# Patient Record
Sex: Female | Born: 1955
Health system: Southern US, Community
[De-identification: ages and names within clinical notes are randomized; demographics above are authoritative.]

## PROBLEM LIST (undated history)

## (undated) DIAGNOSIS — O2230 Deep phlebothrombosis in pregnancy, unspecified trimester: Secondary | ICD-10-CM

## (undated) DIAGNOSIS — Z95810 Presence of automatic (implantable) cardiac defibrillator: Secondary | ICD-10-CM

## (undated) DIAGNOSIS — K589 Irritable bowel syndrome without diarrhea: Secondary | ICD-10-CM

## (undated) DIAGNOSIS — Z8489 Family history of other specified conditions: Secondary | ICD-10-CM

## (undated) DIAGNOSIS — R112 Nausea with vomiting, unspecified: Secondary | ICD-10-CM

## (undated) DIAGNOSIS — I493 Ventricular premature depolarization: Secondary | ICD-10-CM

## (undated) DIAGNOSIS — R011 Cardiac murmur, unspecified: Secondary | ICD-10-CM

## (undated) DIAGNOSIS — I7300 Raynaud's syndrome without gangrene: Secondary | ICD-10-CM

## (undated) DIAGNOSIS — Z9889 Other specified postprocedural states: Secondary | ICD-10-CM

## (undated) DIAGNOSIS — M1990 Unspecified osteoarthritis, unspecified site: Secondary | ICD-10-CM

## (undated) DIAGNOSIS — IMO0001 Reserved for inherently not codable concepts without codable children: Secondary | ICD-10-CM

## (undated) DIAGNOSIS — T7840XA Allergy, unspecified, initial encounter: Secondary | ICD-10-CM

## (undated) DIAGNOSIS — I209 Angina pectoris, unspecified: Secondary | ICD-10-CM

## (undated) DIAGNOSIS — I469 Cardiac arrest, cause unspecified: Secondary | ICD-10-CM

## (undated) DIAGNOSIS — J45909 Unspecified asthma, uncomplicated: Secondary | ICD-10-CM

## (undated) DIAGNOSIS — Z950 Presence of cardiac pacemaker: Secondary | ICD-10-CM

## (undated) DIAGNOSIS — I472 Ventricular tachycardia, unspecified: Secondary | ICD-10-CM

## (undated) DIAGNOSIS — W19XXXA Unspecified fall, initial encounter: Secondary | ICD-10-CM

## (undated) HISTORY — DX: Ventricular tachycardia: I47.2

## (undated) HISTORY — PX: CARPAL TUNNEL RELEASE: SHX101

## (undated) HISTORY — PX: ABDOMINAL HYSTERECTOMY: SHX81

## (undated) HISTORY — DX: Irritable bowel syndrome, unspecified: K58.9

## (undated) HISTORY — DX: Ventricular tachycardia, unspecified: I47.20

## (undated) HISTORY — DX: Cardiac arrest, cause unspecified: I46.9

## (undated) HISTORY — DX: Deep phlebothrombosis in pregnancy, unspecified trimester: O22.30

## (undated) HISTORY — DX: Unspecified fall, initial encounter: W19.XXXA

## (undated) HISTORY — PX: APPENDECTOMY: SHX54

## (undated) HISTORY — PX: OOPHORECTOMY: SHX86

## (undated) HISTORY — DX: Family history of other specified conditions: Z84.89

## (undated) HISTORY — DX: Allergy, unspecified, initial encounter: T78.40XA

## (undated) HISTORY — PX: LAPAROSCOPIC LYSIS OF ADHESIONS: SHX5905

## (undated) HISTORY — DX: Cardiac murmur, unspecified: R01.1

## (undated) HISTORY — PX: PACEMAKER INSERTION: SHX728

## (undated) HISTORY — DX: Presence of automatic (implantable) cardiac defibrillator: Z95.810

## (undated) HISTORY — DX: Ventricular premature depolarization: I49.3

---

## 1984-09-03 DIAGNOSIS — O2230 Deep phlebothrombosis in pregnancy, unspecified trimester: Secondary | ICD-10-CM

## 1984-09-03 HISTORY — DX: Deep phlebothrombosis in pregnancy, unspecified trimester: O22.30

## 1988-03-13 ENCOUNTER — Encounter: Payer: Self-pay | Admitting: Internal Medicine

## 1991-03-12 ENCOUNTER — Encounter: Payer: Self-pay | Admitting: Internal Medicine

## 1992-09-03 DIAGNOSIS — I469 Cardiac arrest, cause unspecified: Secondary | ICD-10-CM

## 1992-09-03 HISTORY — DX: Cardiac arrest, cause unspecified: I46.9

## 1992-09-03 HISTORY — PX: CARDIAC DEFIBRILLATOR PLACEMENT: SHX171

## 1999-09-08 ENCOUNTER — Encounter: Payer: Self-pay | Admitting: Internal Medicine

## 2000-10-16 ENCOUNTER — Encounter: Payer: Self-pay | Admitting: Internal Medicine

## 2001-10-23 ENCOUNTER — Encounter: Payer: Self-pay | Admitting: Internal Medicine

## 2004-06-21 ENCOUNTER — Ambulatory Visit: Payer: Self-pay

## 2004-06-26 ENCOUNTER — Ambulatory Visit: Payer: Self-pay

## 2004-09-03 HISTORY — PX: NASAL SEPTUM SURGERY: SHX37

## 2005-01-01 ENCOUNTER — Encounter: Payer: Self-pay | Admitting: Family Medicine

## 2005-02-01 ENCOUNTER — Encounter: Payer: Self-pay | Admitting: Family Medicine

## 2005-02-01 ENCOUNTER — Ambulatory Visit: Payer: Self-pay | Admitting: Unknown Physician Specialty

## 2005-03-03 ENCOUNTER — Encounter: Payer: Self-pay | Admitting: Family Medicine

## 2005-04-03 ENCOUNTER — Encounter: Payer: Self-pay | Admitting: Family Medicine

## 2005-04-30 ENCOUNTER — Ambulatory Visit: Payer: Self-pay | Admitting: Otolaryngology

## 2005-05-28 ENCOUNTER — Ambulatory Visit: Payer: Self-pay | Admitting: Otolaryngology

## 2005-06-06 ENCOUNTER — Ambulatory Visit: Payer: Self-pay | Admitting: Otolaryngology

## 2006-02-21 ENCOUNTER — Ambulatory Visit: Payer: Self-pay | Admitting: Unknown Physician Specialty

## 2006-05-03 ENCOUNTER — Ambulatory Visit: Payer: Self-pay | Admitting: Unknown Physician Specialty

## 2006-12-12 ENCOUNTER — Ambulatory Visit: Payer: Self-pay | Admitting: Family Medicine

## 2007-02-25 ENCOUNTER — Ambulatory Visit: Payer: Self-pay | Admitting: Unknown Physician Specialty

## 2007-09-30 ENCOUNTER — Ambulatory Visit: Payer: Self-pay | Admitting: Internal Medicine

## 2008-05-20 ENCOUNTER — Ambulatory Visit: Payer: Self-pay | Admitting: Unknown Physician Specialty

## 2008-06-22 ENCOUNTER — Ambulatory Visit: Payer: Self-pay | Admitting: Internal Medicine

## 2009-05-24 ENCOUNTER — Ambulatory Visit: Payer: Self-pay | Admitting: Unknown Physician Specialty

## 2009-11-24 ENCOUNTER — Ambulatory Visit: Payer: Self-pay | Admitting: Internal Medicine

## 2009-12-19 ENCOUNTER — Encounter: Payer: Self-pay | Admitting: Sports Medicine

## 2010-08-09 ENCOUNTER — Ambulatory Visit: Payer: Self-pay | Admitting: Unknown Physician Specialty

## 2010-10-26 ENCOUNTER — Ambulatory Visit: Payer: Self-pay | Admitting: Internal Medicine

## 2011-06-04 ENCOUNTER — Ambulatory Visit (INDEPENDENT_AMBULATORY_CARE_PROVIDER_SITE_OTHER): Payer: PRIVATE HEALTH INSURANCE | Admitting: Internal Medicine

## 2011-06-04 ENCOUNTER — Encounter: Payer: Self-pay | Admitting: Internal Medicine

## 2011-06-04 VITALS — BP 119/77 | HR 66 | Temp 97.5°F | Resp 14 | Ht 67.0 in | Wt 155.0 lb

## 2011-06-04 DIAGNOSIS — J3089 Other allergic rhinitis: Secondary | ICD-10-CM

## 2011-06-04 DIAGNOSIS — I472 Ventricular tachycardia: Secondary | ICD-10-CM

## 2011-06-04 DIAGNOSIS — J302 Other seasonal allergic rhinitis: Secondary | ICD-10-CM

## 2011-06-04 DIAGNOSIS — K581 Irritable bowel syndrome with constipation: Secondary | ICD-10-CM | POA: Insufficient documentation

## 2011-06-04 DIAGNOSIS — I4729 Other ventricular tachycardia: Secondary | ICD-10-CM

## 2011-06-04 DIAGNOSIS — J309 Allergic rhinitis, unspecified: Secondary | ICD-10-CM

## 2011-06-04 DIAGNOSIS — M5126 Other intervertebral disc displacement, lumbar region: Secondary | ICD-10-CM | POA: Insufficient documentation

## 2011-06-04 MED ORDER — PREDNISONE (PAK) 10 MG PO TABS: ORAL_TABLET | ORAL | Status: AC

## 2011-06-04 MED ORDER — ZOSTER VACCINE LIVE 19400 UNT/0.65ML ~~LOC~~ SOLR: 0.6500 mL | Freq: Once | SUBCUTANEOUS | Status: DC

## 2011-06-04 MED ORDER — PREDNISONE 5 MG PO TABS: 5.0000 mg | ORAL_TABLET | ORAL | Status: AC

## 2011-06-04 NOTE — Assessment & Plan Note (Signed)
She is requesting referral to [facility] from [facility] due to change in [facility] coverage.  Her AICD  battery needs changing in 2013.  Will refer to Berton Mount or Sharrell Ku.

## 2011-06-04 NOTE — Progress Notes (Signed)
  Subjective:    Patient ID: Nicole Swanson, female    DOB: 10-17-1955, 55 y.o.   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  She is requesting referral to [facility] due to changes in insurance coverage no longer covering [facility]. She is also requesting additional management of her seasonal rhinitis which is not controlled currently since mid September. Finally shed is requesting consideration of alternative treatment modalities for her back pain with radiculopathy which is worse with sitting and lying dow . Her back is so uncomfortable that she is now standing at work.  Past Medical History  Diagnosis Date  . Idiopathic ventricular tachycardia 1994  . DVT (deep vein thrombosis) in pregnancy 1986    had heparin shots followed by coumadin  . Rhinitis, allergic to other allergen   . 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 Irritable bowel syndrome    No current outpatient prescriptions on file prior to visit.      Review of Systems  Constitutional: Negative for fever, chills and unexpected weight change.  HENT: Negative for hearing loss, ear pain, nosebleeds, congestion, sore throat, facial swelling, rhinorrhea, sneezing, mouth sores, trouble swallowing, neck pain, neck stiffness, voice change, postnasal drip, sinus pressure, tinnitus and ear discharge.   Eyes: Negative for pain, discharge, redness and visual disturbance.  Respiratory: Negative for cough, chest tightness, shortness of breath, wheezing and stridor.   Cardiovascular: Negative for chest pain, palpitations and leg swelling.  Musculoskeletal: Positive for back pain. Negative for myalgias and arthralgias.  Skin:  Negative for color change and rash.  Neurological: Negative for dizziness, weakness, light-headedness and headaches.  Hematological: Negative for adenopathy.  Psychiatric/Behavioral: Positive for sleep disturbance.   BP 119/77  Pulse 66  Temp(Src) 97.5 F (36.4 C) (Oral)  Resp 14  Ht 5\' 7"  (1.702 m)  Wt 155 lb (70.308 kg)  BMI 24.28 kg/m2  SpO2 99%     Objective:   Physical Exam  Constitutional: She is oriented to person, place, and time.  Neurological: She is alert and oriented to person, place, and time. She has normal strength. She displays abnormal reflex. No cranial nerve deficit. Coordination and gait normal.  Reflex Scores:      Tricep reflexes are 2+ on the right side and 2+ on the left side.      Bicep reflexes are 2+ on the right side and 2+ on the left side.      Brachioradialis reflexes are 2+ on the right side and 2+ on the left side.      Patellar reflexes are 2+ on the right side and 4+ on the left side.         Assessment & Plan:

## 2011-06-04 NOTE — Assessment & Plan Note (Addendum)
Her back pain is constant and radiates down th enetire length of her left leg.  MRI has not been doen due to AICD/pacer.  Will send for CT of lumbar spine to rule out herniated disk with sciatic nerve root compromise. Trial of neurontin and prednisone for pain control.

## 2011-06-04 NOTE — Assessment & Plan Note (Signed)
She has persistent symptoms, including congestion, sneezing and conjunctivitis despite simultaneous use of allegra, singulair and flonase.  She has had allergy shots for densensitization which has also failed.  Will try a short course of prednisone (taper x 6 days, then 5 mg every other days for 2 weeks).

## 2011-06-12 ENCOUNTER — Encounter: Payer: Self-pay | Admitting: Internal Medicine

## 2011-06-18 ENCOUNTER — Telehealth: Payer: Self-pay | Admitting: *Deleted

## 2011-06-18 NOTE — Telephone Encounter (Signed)
Message copied by Jobie Quaker on Mon Jun 18, 2011  2:00 PM ------      Message from: Duncan Dull      Created: Thu Jun 14, 2011  5:19 PM      Regarding: labs       I received her labs.  I would not do a thing about her LDL of 122, because  her HDL is 95 (really really good). Repeat in one year.

## 2011-06-18 NOTE — Telephone Encounter (Signed)
Patient notified

## 2011-06-22 ENCOUNTER — Ambulatory Visit: Payer: Self-pay | Admitting: Internal Medicine

## 2011-06-25 ENCOUNTER — Telehealth: Payer: Self-pay | Admitting: Internal Medicine

## 2011-06-25 DIAGNOSIS — M5440 Lumbago with sciatica, unspecified side: Secondary | ICD-10-CM

## 2011-06-25 NOTE — Telephone Encounter (Signed)
Message copied by Edd Fabian on Mon Jun 25, 2011  4:04 PM ------      Message from: Duncan Dull      Created: Mon Jun 25, 2011  1:57 PM      Regarding: ct scan of lumbar spine       Her CT of the spine showed multilevel mild changes but no disk protrusion or spinal stenosis so there is nothing surgical going on.  Does she want to try PT or referral to Pain clinic?

## 2011-06-25 NOTE — Telephone Encounter (Signed)
PT referral in EPIc

## 2011-06-25 NOTE — Telephone Encounter (Signed)
Notified patient of message, she stated she would like to try PT at [facility].  Please advise.

## 2011-06-28 ENCOUNTER — Ambulatory Visit (INDEPENDENT_AMBULATORY_CARE_PROVIDER_SITE_OTHER): Payer: PRIVATE HEALTH INSURANCE | Admitting: Internal Medicine

## 2011-06-28 ENCOUNTER — Encounter: Payer: Self-pay | Admitting: Internal Medicine

## 2011-06-28 DIAGNOSIS — I472 Ventricular tachycardia, unspecified: Secondary | ICD-10-CM

## 2011-06-28 DIAGNOSIS — Z95810 Presence of automatic (implantable) cardiac defibrillator: Secondary | ICD-10-CM

## 2011-06-28 DIAGNOSIS — I469 Cardiac arrest, cause unspecified: Secondary | ICD-10-CM | POA: Insufficient documentation

## 2011-06-28 DIAGNOSIS — I4949 Other premature depolarization: Secondary | ICD-10-CM

## 2011-06-28 DIAGNOSIS — I4729 Other ventricular tachycardia: Secondary | ICD-10-CM

## 2011-06-28 DIAGNOSIS — I493 Ventricular premature depolarization: Secondary | ICD-10-CM

## 2011-06-28 LAB — ICD DEVICE OBSERVATION
AL IMPEDENCE ICD: 512 Ohm
ATRIAL PACING ICD: 65.61 pct
BAMS-0001: 170 {beats}/min
DEV-0020ICD: NEGATIVE
FVT: 0
RV LEAD IMPEDENCE ICD: 456 Ohm
RV LEAD THRESHOLD: 0.5 V
TOT-0001: 2
TOT-0006: 20070119000000
TZAT-0001FASTVT: 1
TZAT-0002ATACH: NEGATIVE
TZAT-0002ATACH: NEGATIVE
TZAT-0002ATACH: NEGATIVE
TZAT-0018ATACH: NEGATIVE
TZAT-0019ATACH: 6 V
TZAT-0019ATACH: 6 V
TZAT-0019ATACH: 6 V
TZAT-0019SLOWVT: 8 V
TZAT-0020FASTVT: 1.5 ms
TZAT-0020SLOWVT: 1.5 ms
TZON-0003SLOWVT: 400 ms
TZON-0003VSLOWVT: 400 ms
TZON-0004SLOWVT: 16
TZON-0004VSLOWVT: 20
TZON-0005SLOWVT: 12
TZST-0001ATACH: 5
TZST-0001FASTVT: 2
TZST-0001FASTVT: 3
TZST-0001FASTVT: 4
TZST-0001SLOWVT: 2
TZST-0001SLOWVT: 3
TZST-0001SLOWVT: 5
TZST-0002ATACH: NEGATIVE
TZST-0002ATACH: NEGATIVE
TZST-0002FASTVT: NEGATIVE
TZST-0002SLOWVT: NEGATIVE
TZST-0002SLOWVT: NEGATIVE
VENTRICULAR PACING ICD: 0.16 pct
VF: 0

## 2011-06-28 NOTE — Patient Instructions (Signed)
Your physician recommends that you schedule a follow-up appointment in: 1 year  

## 2011-06-28 NOTE — Progress Notes (Signed)
HPI  Nicole Swanson is a 55 y.o. 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  In 1997, with a prior history of having presyncope associated with ventricular tachycardia and prior therapy with beta blockers having been discontinued, the patient had a cardiac arrest. She was transferred to [facility]. The evaluation was non-clarifying. She received an ICD. She had a number of appropriate ICD discharges thereafter prompting initiation of sotalol since which time she has had no recurrent ICD discharges. She continues to have multiple episodes of ventricular ectopy. She was also noted to have bradycardia associated ventricular ectopy and ventricular tachycardia and underwent dual-chamber device upgrade  She is approaching ERI of recurrent device. She is hoping to establish her care more locally and hence she is here.  Her family history is noteworthy. She has a brother with a "seizure disorder" with no abnormal EEGs. In addition, her daughter has a seizure disorder with an abnormal EEG. The question of a genetic disorder was reviewed 15 years ago but further genetic testing has not been performed.  The patient has minimal limitations exercise tolerance. Past Medical History  Diagnosis Date  . Idiopathic ventricular tachycardia 1994  . DVT (deep vein thrombosis) in pregnancy 1986    had heparin shots followed by coumadin  . Rhinitis, allergic to other allergen   . 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 Irritable bowel syndrome     Past Surgical History  Procedure Date  . Cardiac defibrillator placement 1994    Dual chamber with pacemaker    Current Outpatient Prescriptions  Medication Sig Dispense Refill  . albuterol (PROVENTIL HFA;VENTOLIN HFA) 108 (90 BASE) MCG/ACT inhaler Inhale 2 puffs into the lungs every 6 (six) hours as needed.        Marland Kitchen aspirin 81 MG tablet Take 81 mg by mouth daily.        . clidinium-chlordiazePOXIDE  (LIBRAX) 2.5-5 MG per capsule Take 1 capsule by mouth 4 (four) times daily as needed.        Marland Kitchen dexlansoprazole (DEXILANT) 60 MG capsule Take 60 mg by mouth daily.        Marland Kitchen estrogen-methylTESTOSTERone (ESTRATEST) 1.25-2.5 MG per tablet Take 1 tablet by mouth. Monday, Wednesday, and Friday       . fexofenadine (ALLEGRA) 180 MG tablet Take 180 mg by mouth daily.        . fluticasone (FLONASE) 50 MCG/ACT nasal spray Place 1 spray into the nose daily.        . montelukast (SINGULAIR) 10 MG tablet Take 10 mg by mouth at bedtime.        . sotalol (BETAPACE) 120 MG tablet Take 120 mg by mouth 2 (two) times daily.        Marland Kitchen zoster vaccine live, PF, (ZOSTAVAX) 16109 UNT/0.65ML injection Inject 19,400 Units into the skin once.  1 vial  0    Allergies  Allergen Reactions  . Penicillins Rash  . Vancomycin Rash    Review of Systems negative except from HPI and PMH  Physical Exam Well developed and well nourished middle-age Caucasian female appearing her stated agein no acute distress HENT normal E scleral and icterus clear Neck Supple JVP flat; carotids brisk and full Clear to ausculation Regular rate and rhythm, no murmurs gallops or rub Soft with active bowel sounds No clubbing cyanosis and edema Alert and oriented, grossly normal motor and sensory function Skin Warm and Dry  ECG sinus  rhythm at 61 Intervals 0.09/2006/0.45 There are T-wave inversions  III V3 and V4  Assessment and  Plan

## 2011-06-28 NOTE — Assessment & Plan Note (Signed)
The patient's device was interrogated.  The information was reviewed. No changes were made in the programming.    

## 2011-06-28 NOTE — Assessment & Plan Note (Signed)
The patient has a properly treated with an ICD and with sotalol. I forgot to arrange for blood testing on her today to look at her potassium and magnesium important in the setting of ongoing sotalol therapy. We will arrange this.  Her ICD is functioning normally.  The unclear issue as to why she had the event in the first place and with her family history of seizures in her brother and her daughter is further genetic testing indicated to try and identify a unifying problem. I will review this with Dr. Robin Searing at [facility] bow anticipate at least be aborted cardiac arrest screening available.  Her T-wave inversions suggest the possibility of arrhythmogenic RV myopathy; a signal-averaged ECG might be indicated. We will have to find out what's been done

## 2011-06-28 NOTE — Assessment & Plan Note (Signed)
I don't know the morphology of these beats. I wonder whether these might have been the trigger for her cardiac arrest. They comprise about 4% of her beats

## 2011-07-02 ENCOUNTER — Telehealth: Payer: Self-pay | Admitting: *Deleted

## 2011-07-02 DIAGNOSIS — Z79899 Other long term (current) drug therapy: Secondary | ICD-10-CM

## 2011-07-02 DIAGNOSIS — I493 Ventricular premature depolarization: Secondary | ICD-10-CM

## 2011-07-02 NOTE — Telephone Encounter (Signed)
Pt called back and left msg on my vm, asking to call back on her cell [phone].

## 2011-07-02 NOTE — Telephone Encounter (Signed)
Called and left msg with pt's husband for her to call back. Need to schedule pt for BMP and Mg per SK; pt on sotalol tx.

## 2011-07-03 NOTE — Telephone Encounter (Signed)
Pt will come in tomorrow for labs.

## 2011-07-04 ENCOUNTER — Ambulatory Visit (INDEPENDENT_AMBULATORY_CARE_PROVIDER_SITE_OTHER): Payer: PRIVATE HEALTH INSURANCE | Admitting: *Deleted

## 2011-07-04 DIAGNOSIS — I493 Ventricular premature depolarization: Secondary | ICD-10-CM

## 2011-07-04 DIAGNOSIS — Z79899 Other long term (current) drug therapy: Secondary | ICD-10-CM

## 2011-07-04 DIAGNOSIS — I4949 Other premature depolarization: Secondary | ICD-10-CM

## 2011-07-05 DIAGNOSIS — 419620001 Death: Secondary | SNOMED CT

## 2011-07-05 LAB — MAGNESIUM: Magnesium: 2.2 mg/dL (ref 1.6–2.6)

## 2011-07-05 LAB — BASIC METABOLIC PANEL
Creatinine, Ser: 0.87 mg/dL (ref 0.57–1.00)
GFR calc Af Amer: 86 mL/min/{1.73_m2} (ref >59)
GFR calc non Af Amer: 74 mL/min/{1.73_m2} (ref >59)
Glucose: 105 mg/dL — ABNORMAL HIGH (ref 65–99)
Potassium: 4.1 mmol/L (ref 3.5–5.2)

## 2011-07-05 DEATH — deceased

## 2011-07-11 ENCOUNTER — Encounter: Payer: Self-pay | Admitting: Internal Medicine

## 2011-07-12 ENCOUNTER — Encounter: Payer: Self-pay | Admitting: Internal Medicine

## 2011-07-16 ENCOUNTER — Encounter: Payer: Self-pay | Admitting: Internal Medicine

## 2011-08-04 ENCOUNTER — Encounter: Payer: Self-pay | Admitting: Internal Medicine

## 2011-09-10 ENCOUNTER — Encounter: Payer: Self-pay | Admitting: Internal Medicine

## 2011-09-10 ENCOUNTER — Ambulatory Visit: Payer: Self-pay | Admitting: Obstetrics and Gynecology

## 2011-09-27 ENCOUNTER — Encounter: Payer: PRIVATE HEALTH INSURANCE | Admitting: *Deleted

## 2011-09-28 ENCOUNTER — Telehealth: Payer: Self-pay | Admitting: *Deleted

## 2011-09-28 NOTE — Telephone Encounter (Signed)
PA is required on Dexilant. Form has been requested.

## 2011-10-01 ENCOUNTER — Encounter: Payer: Self-pay | Admitting: *Deleted

## 2011-10-01 LAB — HM MAMMOGRAPHY: HM Mammogram: NORMAL

## 2011-10-01 NOTE — Telephone Encounter (Signed)
Prior auth form placed in Dr. Melina Schools office.

## 2011-10-03 ENCOUNTER — Ambulatory Visit (INDEPENDENT_AMBULATORY_CARE_PROVIDER_SITE_OTHER): Payer: PRIVATE HEALTH INSURANCE | Admitting: *Deleted

## 2011-10-03 ENCOUNTER — Encounter: Payer: Self-pay | Admitting: Internal Medicine

## 2011-10-03 DIAGNOSIS — Z95810 Presence of automatic (implantable) cardiac defibrillator: Secondary | ICD-10-CM

## 2011-10-03 DIAGNOSIS — I469 Cardiac arrest, cause unspecified: Secondary | ICD-10-CM

## 2011-10-03 DIAGNOSIS — I509 Heart failure, unspecified: Secondary | ICD-10-CM

## 2011-10-05 DIAGNOSIS — 419620001 Death: Secondary | SNOMED CT

## 2011-10-05 DEATH — deceased

## 2011-10-06 LAB — REMOTE ICD DEVICE
BAMS-0001: 170 {beats}/min
BATTERY VOLTAGE: 2.64 V
CHARGE TIME: 12.962 s
DEV-0020ICD: NEGATIVE
PACEART VT: 0
RV LEAD AMPLITUDE: 9.5 mv
TOT-0002: 0
TZAT-0001ATACH: 1
TZAT-0001ATACH: 2
TZAT-0001ATACH: 3
TZAT-0002ATACH: NEGATIVE
TZAT-0012ATACH: 150 ms
TZAT-0012ATACH: 150 ms
TZAT-0012ATACH: 150 ms
TZAT-0012FASTVT: 200 ms
TZAT-0012SLOWVT: 200 ms
TZAT-0018ATACH: NEGATIVE
TZAT-0018ATACH: NEGATIVE
TZAT-0018FASTVT: NEGATIVE
TZAT-0018SLOWVT: NEGATIVE
TZAT-0019ATACH: 6 V
TZAT-0020ATACH: 1.5 ms
TZAT-0020FASTVT: 1.5 ms
TZON-0003ATACH: 350 ms
TZON-0003SLOWVT: 400 ms
TZON-0003VSLOWVT: 400 ms
TZON-0004VSLOWVT: 20
TZST-0001ATACH: 4
TZST-0001ATACH: 6
TZST-0001FASTVT: 2
TZST-0001FASTVT: 6
TZST-0001SLOWVT: 2
TZST-0001SLOWVT: 3
TZST-0001SLOWVT: 4
TZST-0002ATACH: NEGATIVE
TZST-0002ATACH: NEGATIVE
TZST-0002FASTVT: NEGATIVE
TZST-0002FASTVT: NEGATIVE
TZST-0002SLOWVT: NEGATIVE
TZST-0002SLOWVT: NEGATIVE
TZST-0002SLOWVT: NEGATIVE
VF: 0

## 2011-10-10 NOTE — Progress Notes (Signed)
Remote defib check w/icm  

## 2011-10-23 ENCOUNTER — Encounter: Payer: Self-pay | Admitting: *Deleted

## 2011-11-08 ENCOUNTER — Ambulatory Visit (INDEPENDENT_AMBULATORY_CARE_PROVIDER_SITE_OTHER): Payer: PRIVATE HEALTH INSURANCE | Admitting: *Deleted

## 2011-11-08 ENCOUNTER — Encounter: Payer: Self-pay | Admitting: Internal Medicine

## 2011-11-08 DIAGNOSIS — I509 Heart failure, unspecified: Secondary | ICD-10-CM

## 2011-11-08 DIAGNOSIS — I469 Cardiac arrest, cause unspecified: Secondary | ICD-10-CM

## 2011-11-08 DIAGNOSIS — Z95810 Presence of automatic (implantable) cardiac defibrillator: Secondary | ICD-10-CM

## 2011-11-09 LAB — REMOTE ICD DEVICE
AL AMPLITUDE: 3.1 mv
BAMS-0001: 170 {beats}/min
DEV-0020ICD: NEGATIVE
FVT: 0
PACEART VT: 0
RV LEAD AMPLITUDE: 9.5 mv
RV LEAD IMPEDENCE ICD: 472 Ohm
TZAT-0001ATACH: 1
TZAT-0001ATACH: 2
TZAT-0001ATACH: 3
TZAT-0002ATACH: NEGATIVE
TZAT-0002ATACH: NEGATIVE
TZAT-0002SLOWVT: NEGATIVE
TZAT-0012ATACH: 150 ms
TZAT-0018ATACH: NEGATIVE
TZAT-0018ATACH: NEGATIVE
TZAT-0018SLOWVT: NEGATIVE
TZAT-0019ATACH: 6 V
TZAT-0019FASTVT: 8 V
TZAT-0019SLOWVT: 8 V
TZAT-0020FASTVT: 1.5 ms
TZAT-0020SLOWVT: 1.5 ms
TZON-0003SLOWVT: 400 ms
TZON-0004SLOWVT: 16
TZON-0004VSLOWVT: 20
TZON-0005SLOWVT: 12
TZST-0001ATACH: 5
TZST-0001ATACH: 6
TZST-0001FASTVT: 2
TZST-0001FASTVT: 3
TZST-0001FASTVT: 5
TZST-0001SLOWVT: 6
TZST-0002ATACH: NEGATIVE
TZST-0002ATACH: NEGATIVE
TZST-0002FASTVT: NEGATIVE
TZST-0002FASTVT: NEGATIVE
TZST-0002SLOWVT: NEGATIVE
TZST-0002SLOWVT: NEGATIVE
TZST-0002SLOWVT: NEGATIVE
VF: 0

## 2011-11-14 NOTE — Progress Notes (Signed)
Remote icd check w/icm  

## 2011-11-20 ENCOUNTER — Encounter: Payer: Self-pay | Admitting: *Deleted

## 2011-12-03 DIAGNOSIS — 419620001 Death: Secondary | SNOMED CT

## 2011-12-03 DEATH — deceased

## 2011-12-13 ENCOUNTER — Ambulatory Visit (INDEPENDENT_AMBULATORY_CARE_PROVIDER_SITE_OTHER): Payer: PRIVATE HEALTH INSURANCE | Admitting: *Deleted

## 2011-12-13 ENCOUNTER — Encounter: Payer: Self-pay | Admitting: Internal Medicine

## 2011-12-13 DIAGNOSIS — Z95810 Presence of automatic (implantable) cardiac defibrillator: Secondary | ICD-10-CM

## 2011-12-13 DIAGNOSIS — I469 Cardiac arrest, cause unspecified: Secondary | ICD-10-CM

## 2011-12-13 DIAGNOSIS — I509 Heart failure, unspecified: Secondary | ICD-10-CM

## 2011-12-14 LAB — REMOTE ICD DEVICE
AL AMPLITUDE: 2.8 mv
CHARGE TIME: 12.962 s
PACEART VT: 0
RV LEAD AMPLITUDE: 9.1 mv
RV LEAD IMPEDENCE ICD: 456 Ohm
TOT-0001: 2
TOT-0002: 0
TZAT-0001ATACH: 1
TZAT-0001ATACH: 2
TZAT-0001SLOWVT: 1
TZAT-0002ATACH: NEGATIVE
TZAT-0002ATACH: NEGATIVE
TZAT-0002FASTVT: NEGATIVE
TZAT-0002SLOWVT: NEGATIVE
TZAT-0012FASTVT: 200 ms
TZAT-0018ATACH: NEGATIVE
TZAT-0018ATACH: NEGATIVE
TZAT-0019ATACH: 6 V
TZAT-0019ATACH: 6 V
TZAT-0019SLOWVT: 8 V
TZAT-0020ATACH: 1.5 ms
TZAT-0020FASTVT: 1.5 ms
TZON-0004VSLOWVT: 20
TZST-0001ATACH: 5
TZST-0001FASTVT: 5
TZST-0001SLOWVT: 3
TZST-0001SLOWVT: 4
TZST-0001SLOWVT: 5
TZST-0002ATACH: NEGATIVE
TZST-0002ATACH: NEGATIVE
TZST-0002FASTVT: NEGATIVE
TZST-0002FASTVT: NEGATIVE
TZST-0002FASTVT: NEGATIVE
TZST-0002SLOWVT: NEGATIVE
TZST-0002SLOWVT: NEGATIVE

## 2011-12-20 NOTE — Progress Notes (Signed)
Remote icd check w/icm  

## 2012-01-01 ENCOUNTER — Ambulatory Visit (INDEPENDENT_AMBULATORY_CARE_PROVIDER_SITE_OTHER): Payer: PRIVATE HEALTH INSURANCE | Admitting: Internal Medicine

## 2012-01-01 ENCOUNTER — Encounter: Payer: Self-pay | Admitting: *Deleted

## 2012-01-01 ENCOUNTER — Encounter: Payer: Self-pay | Admitting: Internal Medicine

## 2012-01-01 DIAGNOSIS — M5126 Other intervertebral disc displacement, lumbar region: Secondary | ICD-10-CM

## 2012-01-01 MED ORDER — DEXLANSOPRAZOLE 60 MG PO CPDR: 60.0000 mg | DELAYED_RELEASE_CAPSULE | Freq: Every day | ORAL | Status: DC

## 2012-01-01 MED ORDER — ALBUTEROL SULFATE HFA 108 (90 BASE) MCG/ACT IN AERS: 2.0000 | INHALATION_SPRAY | Freq: Four times a day (QID) | RESPIRATORY_TRACT | Status: DC | PRN

## 2012-01-01 MED ORDER — CELECOXIB 200 MG PO CAPS: 200.0000 mg | ORAL_CAPSULE | Freq: Two times a day (BID) | ORAL | Status: AC

## 2012-01-01 MED ORDER — FLUTICASONE PROPIONATE 50 MCG/ACT NA SUSP: 1.0000 | Freq: Every day | NASAL | Status: DC

## 2012-01-01 MED ORDER — FEXOFENADINE HCL 180 MG PO TABS: 180.0000 mg | ORAL_TABLET | Freq: Every day | ORAL | Status: DC

## 2012-01-01 MED ORDER — MONTELUKAST SODIUM 10 MG PO TABS: 10.0000 mg | ORAL_TABLET | Freq: Every day | ORAL | Status: DC

## 2012-01-01 NOTE — Assessment & Plan Note (Signed)
With no evidence of spinal stenosis by October 2012 CT,  And no radiculopathy.  Trial of celebrex 200 mg one or two times daily.  With tylenol 2000 mg daily.  Discussed trial of TENS Unit but will need to check with EP Dr. Graciela Husbands .

## 2012-01-01 NOTE — Patient Instructions (Signed)
We are trying celebrex as an alternative to nabumetone .  Start with  one capsule daily,  Increase to every 12 hours if needed.,  Also ok to add tylenol 500 mg 4 daily (total of 2000 mg daily) to either celebrex or nabumetone  Option 3 is increase nabumetone  750 mg twice daily and add tylenol to it

## 2012-01-01 NOTE — Progress Notes (Signed)
Patient ID: Nicole Swanson, female   DOB: Dec 18, 1955, 56 y.o.   MRN: [id number]  Patient Active Problem List  Diagnoses  . 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 Irritable bowel syndrome  . Aborted cardiac arrest  . Dual implantable cardiac defibrillator  . PVC's (premature ventricular contractions)    Subjective:  CC:   Chief Complaint  Patient presents with  . Medication Refill  . Back Pain    HPI:   Nicole Chittum Priestis a 56 y.o. female who presents with persistent low back pain,  Somewhat improved after PT referral in October following CT of lumbar spine which was negative for spinal stenosis or herniated disk.  but she has returned to her previous level of pain.  She is using nabumetone 500 mg twice daily with incomplete relief, and a prior tramadol trial caused dysphoria.  She has no prior trial of celebrex and tylenol so she is willing to try that now.  She denies numbness and radiation and has no history of GI upset with use of nabumetone.  Takes Nexium daily .    2nd issue is impending demise of her AICD. Receiving close follow up with Dr. Graciela Husbands for any misfirings and need for battery replacement.     Past Medical History  Diagnosis Date  . Aborted cardiac arrest 1994  . DVT (deep vein thrombosis) in pregnancy 1986    had heparin shots followed by coumadin  . Dual implantable cardiac defibrillator     Medtronic initially implanted in 1997  . 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 Irritable bowel syndrome   . PVC's (premature ventricular contractions)   . Ventricular tachycardia   . Family history of seizures     Question neurological versus cardiac    Past Surgical History  Procedure Date  . Cardiac defibrillator placement 1994    Dual chamber with pacemaker  . Nasal septum surgery 2006    [name]          The following portions of the patient's history were reviewed and updated as appropriate: Allergies, current  medications, and problem list.    Review of Systems:   12 Pt  review of systems was negative except those addressed in the HPI,     History   Social History  . Marital Status: Married    Spouse Name: N/A    Number of Children: N/A  . Years of Education: N/A   Occupational History  . Not on file.   Social History Main Topics  . Smoking status: Former Smoker    Types: Cigarettes    Quit date: 06/04/1995  . Smokeless tobacco: Never Used  . Alcohol Use: Yes     occasional  . Drug Use: No  . Sexually Active: Not on file   Other Topics Concern  . Not on file   Social History Narrative  . No narrative on file    Objective:  BP 108/60  Pulse 76  Temp(Src) 98.4 F (36.9 C) (Oral)  Resp 14  Ht 5\' 7"  (1.702 m)  Wt 155 lb (70.308 kg)  BMI 24.28 kg/m2  SpO2 97%  General appearance: alert, cooperative and appears stated age Ears: normal TM's and external ear canals both ears Throat: lips, mucosa, and tongue normal; teeth and gums normal Neck: no adenopathy, no carotid bruit, supple, symmetrical, trachea midline and thyroid not enlarged, symmetric, no tenderness/mass/nodules Back: symmetric, no curvature. ROM normal. No CVA tenderness. Lungs: clear to auscultation  bilaterally Heart: regular rate and rhythm, S1, S2 normal, no murmur, click, rub or gallop Abdomen: soft, non-tender; bowel sounds normal; no masses,  no organomegaly Pulses: 2+ and symmetric Skin: Skin color, texture, turgor normal. No rashes or lesions Lymph nodes: Cervical, supraclavicular, and axillary nodes normal.  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  Trial of celebrex 200 mg one or two times daily.  With tylenol 2000 mg daily.  Discussed trial of TENS Unit but will need to check with EP Dr. Graciela Husbands .      Updated Medication List Outpatient Encounter Prescriptions as of 01/01/2012   Medication Sig Dispense Refill  . albuterol (PROVENTIL HFA;VENTOLIN HFA) 108 (90 BASE) MCG/ACT inhaler Inhale 2 puffs into the lungs every 6 (six) hours as needed.  18 g  5  . aspirin 81 MG tablet Take 81 mg by mouth daily.        . clidinium-chlordiazePOXIDE (LIBRAX) 2.5-5 MG per capsule Take 1 capsule by mouth 4 (four) times daily as needed.        Marland Kitchen dexlansoprazole (DEXILANT) 60 MG capsule Take 1 capsule (60 mg total) by mouth daily.  30 capsule  5  . estrogen-methylTESTOSTERone (ESTRATEST) 1.25-2.5 MG per tablet Take 1 tablet by mouth. Monday, Wednesday, and Friday       . fexofenadine (ALLEGRA) 180 MG tablet Take 1 tablet (180 mg total) by mouth daily.  30 tablet  5  . fluticasone (FLONASE) 50 MCG/ACT nasal spray Place 1 spray into the nose daily.  16 g  5  . montelukast (SINGULAIR) 10 MG tablet Take 1 tablet (10 mg total) by mouth at bedtime.  30 tablet  5  . sotalol (BETAPACE) 120 MG tablet Take 120 mg by mouth 2 (two) times daily.        Marland Kitchen DISCONTD: albuterol (PROVENTIL HFA;VENTOLIN HFA) 108 (90 BASE) MCG/ACT inhaler Inhale 2 puffs into the lungs every 6 (six) hours as needed.        Marland Kitchen DISCONTD: dexlansoprazole (DEXILANT) 60 MG capsule Take 60 mg by mouth daily.        Marland Kitchen DISCONTD: fexofenadine (ALLEGRA) 180 MG tablet Take 180 mg by mouth daily.        Marland Kitchen DISCONTD: fluticasone (FLONASE) 50 MCG/ACT nasal spray Place 1 spray into the nose daily.        Marland Kitchen DISCONTD: montelukast (SINGULAIR) 10 MG tablet Take 10 mg by mouth at bedtime.        . celecoxib (CELEBREX) 200 MG capsule Take 1 capsule (200 mg total) by mouth 2 (two) times daily.  60 capsule  3  . DISCONTD: zoster vaccine live, PF, (ZOSTAVAX) 78295 UNT/0.65ML injection Inject 19,400 Units into the skin once.  1 vial  0     Orders Placed This Encounter  Procedures  . HM MAMMOGRAPHY  . HM COLONOSCOPY    No Follow-up on file.

## 2012-01-10 ENCOUNTER — Encounter: Payer: Self-pay | Admitting: Internal Medicine

## 2012-01-10 ENCOUNTER — Ambulatory Visit (INDEPENDENT_AMBULATORY_CARE_PROVIDER_SITE_OTHER): Payer: PRIVATE HEALTH INSURANCE | Admitting: *Deleted

## 2012-01-10 DIAGNOSIS — I469 Cardiac arrest, cause unspecified: Secondary | ICD-10-CM

## 2012-01-10 DIAGNOSIS — I509 Heart failure, unspecified: Secondary | ICD-10-CM

## 2012-01-10 DIAGNOSIS — Z95810 Presence of automatic (implantable) cardiac defibrillator: Secondary | ICD-10-CM

## 2012-01-16 LAB — REMOTE ICD DEVICE
AL AMPLITUDE: 2.9 mv
AL IMPEDENCE ICD: 480 Ohm
ATRIAL PACING ICD: 47.95 pct
BATTERY VOLTAGE: 2.62 V
FVT: 0
RV LEAD AMPLITUDE: 8.5 mv
RV LEAD IMPEDENCE ICD: 448 Ohm
TOT-0006: 20070119000000
TZAT-0001ATACH: 1
TZAT-0001ATACH: 2
TZAT-0001ATACH: 3
TZAT-0001SLOWVT: 1
TZAT-0002ATACH: NEGATIVE
TZAT-0002FASTVT: NEGATIVE
TZAT-0002SLOWVT: NEGATIVE
TZAT-0012ATACH: 150 ms
TZAT-0012ATACH: 150 ms
TZAT-0012SLOWVT: 200 ms
TZAT-0018ATACH: NEGATIVE
TZAT-0018ATACH: NEGATIVE
TZAT-0018FASTVT: NEGATIVE
TZAT-0018SLOWVT: NEGATIVE
TZAT-0019FASTVT: 8 V
TZAT-0020ATACH: 1.5 ms
TZAT-0020FASTVT: 1.5 ms
TZON-0003ATACH: 350 ms
TZON-0004SLOWVT: 16
TZST-0001ATACH: 4
TZST-0001ATACH: 6
TZST-0001FASTVT: 5
TZST-0001FASTVT: 6
TZST-0001SLOWVT: 4
TZST-0001SLOWVT: 5
TZST-0001SLOWVT: 6
TZST-0002ATACH: NEGATIVE
TZST-0002FASTVT: NEGATIVE
TZST-0002FASTVT: NEGATIVE
TZST-0002FASTVT: NEGATIVE
TZST-0002SLOWVT: NEGATIVE
TZST-0002SLOWVT: NEGATIVE
VF: 0

## 2012-01-23 NOTE — Progress Notes (Signed)
ICD remote with ICM 

## 2012-02-07 ENCOUNTER — Encounter: Payer: Self-pay | Admitting: *Deleted

## 2012-02-21 ENCOUNTER — Encounter: Payer: Self-pay | Admitting: Internal Medicine

## 2012-02-21 ENCOUNTER — Ambulatory Visit (INDEPENDENT_AMBULATORY_CARE_PROVIDER_SITE_OTHER): Payer: PRIVATE HEALTH INSURANCE | Admitting: *Deleted

## 2012-02-21 DIAGNOSIS — Z95810 Presence of automatic (implantable) cardiac defibrillator: Secondary | ICD-10-CM

## 2012-02-21 DIAGNOSIS — I469 Cardiac arrest, cause unspecified: Secondary | ICD-10-CM

## 2012-02-27 LAB — REMOTE ICD DEVICE
AL AMPLITUDE: 3.1 mv
ATRIAL PACING ICD: 53.83 pct
DEV-0020ICD: NEGATIVE
FVT: 0
PACEART VT: 0
RV LEAD IMPEDENCE ICD: 464 Ohm
TOT-0001: 2
TZAT-0001ATACH: 1
TZAT-0001ATACH: 2
TZAT-0001ATACH: 3
TZAT-0001FASTVT: 1
TZAT-0001SLOWVT: 1
TZAT-0002ATACH: NEGATIVE
TZAT-0002ATACH: NEGATIVE
TZAT-0002FASTVT: NEGATIVE
TZAT-0002SLOWVT: NEGATIVE
TZAT-0012ATACH: 150 ms
TZAT-0018ATACH: NEGATIVE
TZAT-0018ATACH: NEGATIVE
TZAT-0018ATACH: NEGATIVE
TZAT-0019ATACH: 6 V
TZAT-0019SLOWVT: 8 V
TZAT-0020ATACH: 1.5 ms
TZON-0003VSLOWVT: 400 ms
TZON-0004VSLOWVT: 20
TZON-0005SLOWVT: 12
TZST-0001ATACH: 6
TZST-0001FASTVT: 3
TZST-0001FASTVT: 5
TZST-0001SLOWVT: 2
TZST-0001SLOWVT: 3
TZST-0001SLOWVT: 4
TZST-0002ATACH: NEGATIVE
TZST-0002ATACH: NEGATIVE
TZST-0002FASTVT: NEGATIVE
TZST-0002FASTVT: NEGATIVE
TZST-0002SLOWVT: NEGATIVE
TZST-0002SLOWVT: NEGATIVE
TZST-0002SLOWVT: NEGATIVE

## 2012-03-03 DIAGNOSIS — 419620001 Death: Secondary | SNOMED CT

## 2012-03-03 DEATH — deceased

## 2012-03-05 ENCOUNTER — Encounter: Payer: Self-pay | Admitting: *Deleted

## 2012-03-12 ENCOUNTER — Encounter: Payer: Self-pay | Admitting: Internal Medicine

## 2012-03-12 ENCOUNTER — Ambulatory Visit (INDEPENDENT_AMBULATORY_CARE_PROVIDER_SITE_OTHER): Payer: PRIVATE HEALTH INSURANCE | Admitting: Internal Medicine

## 2012-03-12 ENCOUNTER — Telehealth: Payer: Self-pay | Admitting: Internal Medicine

## 2012-03-12 ENCOUNTER — Encounter: Payer: Self-pay | Admitting: *Deleted

## 2012-03-12 VITALS — BP 112/66 | HR 70 | Ht 67.0 in | Wt 156.0 lb

## 2012-03-12 DIAGNOSIS — I469 Cardiac arrest, cause unspecified: Secondary | ICD-10-CM

## 2012-03-12 DIAGNOSIS — Z95810 Presence of automatic (implantable) cardiac defibrillator: Secondary | ICD-10-CM

## 2012-03-12 MED ORDER — SOTALOL HCL 120 MG PO TABS: 120.0000 mg | ORAL_TABLET | Freq: Two times a day (BID) | ORAL | Status: DC

## 2012-03-12 NOTE — Patient Instructions (Addendum)
Please let us know if you can have a first case slot (7:30 am start) at [facility] on any of these mornings: Monday 03/24/12, Monday 04/14/12, Friday 04/18/12.  Please call Dr. Odessa [name] nurse, Herbert Seta, at [phone] when you know what will work.

## 2012-03-12 NOTE — Telephone Encounter (Signed)
New msg [facility] called about scheduling surgery. She said she spoke with pt and was told to call

## 2012-03-12 NOTE — Assessment & Plan Note (Signed)
The patient's device was interrogated.  The information was reviewed. No changes were made in the programming.   We have reviewed the benefits and risks of generator replacement.  These include but are not limited to lead fracture and infection.  The patient understands, agrees and is willing to proceed.    

## 2012-03-12 NOTE — Progress Notes (Signed)
  HPI  Nicole Swanson is a 56 y.o. female Seen in followup for ICD implanted for aborted cardiac arrest in which she was cared for at [facility]. He also had bradycardia associated ventricular ectopy and underwent dual-chamber implantation.  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 Echocardiogram today demonstrates T-wave inversions  Past Medical History  Diagnosis Date  . Aborted cardiac arrest 1994  . DVT (deep vein thrombosis) in pregnancy 1986    had heparin shots followed by coumadin  . Dual implantable cardiac defibrillator     Medtronic initially implanted in 1997  . 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 Irritable bowel syndrome   . PVC's (premature ventricular contractions)   . Ventricular tachycardia   . Family history of seizures     Question neurological versus cardiac    Past Surgical History  Procedure Date  . Cardiac defibrillator placement 1994    Dual chamber with pacemaker  . Nasal septum surgery 2006    [name]     Current Outpatient Prescriptions  Medication Sig Dispense Refill  . albuterol (PROVENTIL HFA;VENTOLIN HFA) 108 (90 BASE) MCG/ACT inhaler Inhale 2 puffs into the lungs every 6 (six) hours as needed.  18 g  5  . aspirin 81 MG tablet Take 81 mg by mouth daily.        . clidinium-chlordiazePOXIDE (LIBRAX) 2.5-5 MG per capsule Take 1 capsule by mouth 4 (four) times daily as needed.        Marland Kitchen dexlansoprazole (DEXILANT) 60 MG capsule Take 1 capsule (60 mg total) by mouth daily.  30 capsule  5  . estrogen-methylTESTOSTERone (ESTRATEST) 1.25-2.5 MG per tablet Take 1 tablet by mouth. Monday, Wednesday, and Friday       . fexofenadine (ALLEGRA) 180 MG tablet Take 1 tablet (180 mg total) by mouth daily.  30 tablet  5  . fluticasone (FLONASE) 50 MCG/ACT nasal spray Place 1 spray into the nose daily.  16 g  5  . montelukast (SINGULAIR) 10 MG tablet Take 1 tablet (10 mg total) by mouth  at bedtime.  30 tablet  5  . sotalol (BETAPACE) 120 MG tablet Take 120 mg by mouth 2 (two) times daily.          Allergies  Allergen Reactions  . Penicillins Rash  . Vancomycin Rash    Review of Systems negative except from HPI and PMH  Physical Exam BP 112/66  Pulse 70  Ht 5\' 7"  (1.702 m)  Wt 156 lb (70.761 kg)  BMI 24.43 kg/m2 Well developed and well nourished in no acute distress HENT normal E scleral and icterus clear Neck Supple JVP flat; carotids brisk and full Clear to ausculation Regular rate and rhythm, no murmurs gallops or rub Soft with active bowel sounds No clubbing cyanosis none Edema Alert and oriented, grossly normal motor and sensory function Skin Warm and Dry    Assessment and  Plan

## 2012-03-12 NOTE — Telephone Encounter (Signed)
I spoke with Leah at the [facility] OR- she states that 03/24/12 at 7:30 am is an available time slot for an ICD generator change. CPT code 16109. Per Clydie Braun in pre-admit, the patient needs to come on 03/18/12 at 8:15 am to the [facility] rm [phone]. If that time does work for her she can call [phone]. I will need to fax orders and her H&P to [phone]. Sherri Rad, RN, BSN  I have spoken with the patient and she is agreeable with 7/16 for her pre-op work up. I have left a message for the [place] office to see if they have [facility] paper work that I need to complete for her orders. Sherri Rad, RN, BSN

## 2012-03-12 NOTE — Assessment & Plan Note (Signed)
Electrocardiogram today raises the possibility of arrhythmogenic RV cardiomyopathy. Her brothers history is certainly concerning for familial process.

## 2012-03-13 ENCOUNTER — Other Ambulatory Visit: Payer: Self-pay | Admitting: *Deleted

## 2012-03-13 MED ORDER — FEXOFENADINE HCL 180 MG PO TABS: 180.0000 mg | ORAL_TABLET | Freq: Every day | ORAL | Status: DC

## 2012-03-18 ENCOUNTER — Encounter: Payer: Self-pay | Admitting: Internal Medicine

## 2012-03-18 ENCOUNTER — Ambulatory Visit: Payer: Self-pay | Admitting: Internal Medicine

## 2012-03-18 ENCOUNTER — Telehealth: Payer: Self-pay | Admitting: Internal Medicine

## 2012-03-18 LAB — CBC WITH DIFFERENTIAL/PLATELET
Basophil %: 0.7 %
Eosinophil #: 0.1 10*3/uL (ref 0.0–0.7)
Eosinophil %: 2.8 %
HGB: 13.1 g/dL (ref 12.0–16.0)
Lymphocyte #: 0.9 10*3/uL — ABNORMAL LOW (ref 1.0–3.6)
Lymphocyte %: 17 %
MCH: 29 pg (ref 26.0–34.0)
MCHC: 33 g/dL (ref 32.0–36.0)
MCV: 88 fL (ref 80–100)
Monocyte #: 0.4 x10 3/mm (ref 0.2–0.9)
Neutrophil #: 3.6 10*3/uL (ref 1.4–6.5)
Neutrophil %: 72.2 %
RBC: 4.5 10*6/uL (ref 3.80–5.20)

## 2012-03-18 LAB — PROTIME-INR
INR: 0.9
Prothrombin Time: 12.6 secs (ref 11.5–14.7)

## 2012-03-18 LAB — BASIC METABOLIC PANEL
Calcium, Total: 9.1 mg/dL (ref 8.5–10.1)
Chloride: 106 mmol/L (ref 98–107)
Co2: 29 mmol/L (ref 21–32)
Creatinine: 0.77 mg/dL (ref 0.60–1.30)
Potassium: 4.6 mmol/L (ref 3.5–5.1)
Sodium: 141 mmol/L (ref 136–145)

## 2012-03-18 NOTE — Telephone Encounter (Signed)
Please return call to patient regarding pre-op appnt, and medication hold. She can be reached at [phone].

## 2012-03-18 NOTE — Telephone Encounter (Signed)
I spoke with the patient. She states she had her pre-op this morning at [facility]. She was told by them to call to find out if she needed to hold aspirin or sotalol prior to her generator change. I advised she did not need to hold these. I explained I had mailed her a letter of instructions that should still be coming to her.

## 2012-03-18 NOTE — Telephone Encounter (Signed)
Orders and the H&P have been faxed to [facility] pre-admit for ICD generator change on 03/24/12. Faxed to [phone].

## 2012-03-21 ENCOUNTER — Other Ambulatory Visit: Payer: Self-pay | Admitting: *Deleted

## 2012-03-21 MED ORDER — NABUMETONE 500 MG PO TABS: 500.0000 mg | ORAL_TABLET | Freq: Two times a day (BID) | ORAL | Status: DC

## 2012-03-24 ENCOUNTER — Ambulatory Visit: Payer: Self-pay | Admitting: Internal Medicine

## 2012-03-28 ENCOUNTER — Ambulatory Visit (INDEPENDENT_AMBULATORY_CARE_PROVIDER_SITE_OTHER): Payer: PRIVATE HEALTH INSURANCE | Admitting: Internal Medicine

## 2012-03-28 ENCOUNTER — Encounter: Payer: Self-pay | Admitting: Internal Medicine

## 2012-03-28 VITALS — BP 108/62 | HR 77 | Temp 98.3°F | Resp 16 | Wt 155.2 lb

## 2012-03-28 DIAGNOSIS — M5126 Other intervertebral disc displacement, lumbar region: Secondary | ICD-10-CM

## 2012-03-28 DIAGNOSIS — M549 Dorsalgia, unspecified: Secondary | ICD-10-CM

## 2012-03-28 MED ORDER — TRAMADOL HCL 50 MG PO TABS: 50.0000 mg | ORAL_TABLET | Freq: Three times a day (TID) | ORAL | Status: AC | PRN

## 2012-03-28 MED ORDER — ONDANSETRON HCL 8 MG PO TABS
8.0000 mg | ORAL_TABLET | Freq: Three times a day (TID) | ORAL | Status: AC | PRN
Start: 2012-03-28 — End: 2012-04-04

## 2012-03-28 NOTE — Progress Notes (Signed)
Patient ID: Nicole Swanson, female   DOB: 26-Jun-1956, 56 y.o.   MRN: [id number] Patient Active Problem List  Diagnosis  . 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 Irritable bowel syndrome  . Aborted cardiac arrest  . ICD-Medtronic  . PVC's (premature ventricular contractions)    Subjective:  CC:   Chief Complaint  Patient presents with  . Back Pain    HPI:   Nicole Duey Priestis a 56 y.o. female who presents with persistent back pain.  She has a history of degenerative disease by prior lumbar CT which is intermittent and has not responded to celebrex twice daily. , then returned to a trial of nabumetone  but it is caused  gastritis. Her pain is most aggravated by sitting and bending  as well as by lying down. She had her pacemaker replaced last week,  And the procedure required lying on a hospital stretcher, which apparently aggravated her back pain .  She was evaluated in October following CT of lumbar spine which was negative for spinal stenosis or herniated disk.  She had improvement after PT but she has returned to her previous level of pain.  She denies numbness and radiation. Takes Nexium daily .      Past Medical History  Diagnosis Date  . Aborted cardiac arrest 1994  . DVT (deep vein thrombosis) in pregnancy 1986    had heparin shots followed by coumadin  . Dual implantable cardiac defibrillator     Medtronic initially implanted in 1997  . 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 Irritable bowel syndrome   . PVC's (premature ventricular contractions)   . Ventricular tachycardia   . Family history of seizures     Question neurological versus cardiac    Past Surgical History  Procedure Date  . Cardiac defibrillator placement 1994    Dual chamber with pacemaker  . Nasal septum surgery 2006    [name]     The following portions of the patient's history were reviewed and updated as appropriate: Allergies, current medications, and problem  list.    Review of Systems:   Review of Systems  Constitutional: Negative.   HENT: Negative.   Eyes: Negative.   Respiratory: Negative.   Cardiovascular: Negative.   Gastrointestinal: Positive for abdominal pain.  Musculoskeletal: Positive for back pain.  Skin: Negative.   Neurological: Negative.   Endo/Heme/Allergies: Negative.   Psychiatric/Behavioral: Negative.        History   Social History  . Marital Status: Married    Spouse Name: N/A    Number of Children: N/A  . Years of Education: N/A   Occupational History  . Not on file.   Social History Main Topics  . Smoking status: Former Smoker    Types: Cigarettes    Quit date: 06/04/1995  . Smokeless tobacco: Never Used  . Alcohol Use: Yes     occasional  . Drug Use: No  . Sexually Active: Not on file   Other Topics Concern  . Not on file   Social History Narrative  . No narrative on file    Objective:  BP 108/62  Pulse 77  Temp 98.3 F (36.8 C) (Oral)  Resp 16  Wt 155 lb 4 oz (70.421 kg)  SpO2 96%  General appearance: alert, cooperative and appears stated age Neck: no adenopathy, no carotid bruit, supple, symmetrical, trachea midline and thyroid not enlarged, symmetric, no tenderness/mass/nodules Back: symmetric, no curvature. ROM normal. No CVA  tenderness. Tenderness over L5 region.  Straight leg lift negative.  Lungs: clear to auscultation bilaterally Chest wall: well healing surgical incision left lateral chest wall, no erythema  Heart: regular rate and rhythm, S1, S2 normal, no murmur, click, rub or gallop Abdomen: soft, non-tender; bowel sounds normal; no masses,  no organomegaly Pulses: 2+ and symmetric Skin: Skin color, texture, turgor normal. No rashes or lesions Lymph nodes: Cervical, supraclavicular, and axillary nodes normal. 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  Her pain is no longer controlled with celebrex 200 mg two times daily and nabumetone has caused gastritis. rx tramadol and zofran, and referral to PT for trial of TENS Unit but will need to check Dr. Graciela Husbands .       Updated Medication List Outpatient Encounter Prescriptions as of 03/28/2012  Medication Sig Dispense Refill  . albuterol (PROVENTIL HFA;VENTOLIN HFA) 108 (90 BASE) MCG/ACT inhaler Inhale 2 puffs into the lungs every 6 (six) hours as needed.  18 g  5  . aspirin 81 MG tablet Take 81 mg by mouth daily.        . beclomethasone (QVAR) 40 MCG/ACT inhaler Inhale 2 puffs into the lungs 2 (two) times daily as needed.      . clidinium-chlordiazePOXIDE (LIBRAX) 2.5-5 MG per capsule Take 1 capsule by mouth 4 (four) times daily as needed.        Marland Kitchen dexlansoprazole (DEXILANT) 60 MG capsule Take 1 capsule (60 mg total) by mouth daily.  30 capsule  5  . estrogen-methylTESTOSTERone (ESTRATEST) 1.25-2.5 MG per tablet Take 1 tablet by mouth. Monday, Wednesday, and Friday       . fexofenadine (ALLEGRA) 180 MG tablet Take 1 tablet (180 mg total) by mouth daily.  30 tablet  5  . fluticasone (FLONASE) 50 MCG/ACT nasal spray Place 1 spray into the nose daily.  16 g  5  . montelukast (SINGULAIR) 10 MG tablet Take 1 tablet (10 mg total) by mouth at bedtime.  30 tablet  5  . sotalol (BETAPACE) 120 MG tablet Take 1 tablet (120 mg total) by mouth 2 (two) times daily.  180 tablet  3  . ondansetron (ZOFRAN) 8 MG tablet Take 1 tablet (8 mg total) by mouth every 8 (eight) hours as needed for nausea.  30 tablet  1  . traMADol (ULTRAM) 50 MG tablet Take 1 tablet (50 mg total) by mouth every 8 (eight) hours as needed for pain.  90 tablet  3  . DISCONTD: celecoxib (CELEBREX) 200 MG capsule Take 200 mg by mouth 2 (two) times daily.      Marland Kitchen DISCONTD: nabumetone (RELAFEN) 500 MG tablet Take 1 tablet (500 mg total) by mouth 2 (two) times daily.  60 tablet  0     Orders Placed This Encounter    Procedures  . Ambulatory referral to Physical Therapy    No Follow-up on file.

## 2012-03-28 NOTE — Patient Instructions (Addendum)
Try 1/2 zofran and tramadol twice daily (more if needed,  Take tramadol with food )  PT referral for therapy and possibly a TENS unit

## 2012-03-30 ENCOUNTER — Encounter: Payer: Self-pay | Admitting: Internal Medicine

## 2012-03-30 NOTE — Assessment & Plan Note (Signed)
With no evidence of spinal stenosis by October 2012 CT,  And no radiculopathy.  Her pain is no longer controlled with celebrex 200 mg two times daily and nabumeotne has caused gastritis. rx vicodin  And referral to PT for trial of TENS Unit but will need to check Dr. Graciela Husbands .

## 2012-04-03 ENCOUNTER — Encounter: Payer: Self-pay | Admitting: Internal Medicine

## 2012-04-03 ENCOUNTER — Ambulatory Visit: Payer: PRIVATE HEALTH INSURANCE

## 2012-04-03 ENCOUNTER — Ambulatory Visit (INDEPENDENT_AMBULATORY_CARE_PROVIDER_SITE_OTHER): Payer: PRIVATE HEALTH INSURANCE | Admitting: Cardiology

## 2012-04-03 DIAGNOSIS — I472 Ventricular tachycardia: Secondary | ICD-10-CM

## 2012-04-03 DIAGNOSIS — 419620001 Death: Secondary | SNOMED CT

## 2012-04-03 DIAGNOSIS — I469 Cardiac arrest, cause unspecified: Secondary | ICD-10-CM

## 2012-04-03 DIAGNOSIS — Z95810 Presence of automatic (implantable) cardiac defibrillator: Secondary | ICD-10-CM

## 2012-04-03 DIAGNOSIS — I4729 Other ventricular tachycardia: Secondary | ICD-10-CM

## 2012-04-03 DEATH — deceased

## 2012-04-04 LAB — ICD DEVICE OBSERVATION
ATRIAL PACING ICD: 80 pct
BATTERY VOLTAGE: 3.21 V
DEV-0020ICD: NEGATIVE
RV LEAD IMPEDENCE ICD: 494 Ohm
VENTRICULAR PACING ICD: 0.1 pct

## 2012-04-22 ENCOUNTER — Encounter: Payer: Self-pay | Admitting: Internal Medicine

## 2012-05-02 NOTE — Progress Notes (Signed)
Device check only-- see PaceArt

## 2012-05-04 ENCOUNTER — Encounter: Payer: Self-pay | Admitting: Internal Medicine

## 2012-06-03 ENCOUNTER — Ambulatory Visit (INDEPENDENT_AMBULATORY_CARE_PROVIDER_SITE_OTHER): Payer: PRIVATE HEALTH INSURANCE | Admitting: Internal Medicine

## 2012-06-03 ENCOUNTER — Encounter: Payer: Self-pay | Admitting: Internal Medicine

## 2012-06-03 VITALS — BP 118/70 | HR 61 | Ht 67.0 in | Wt 155.0 lb

## 2012-06-03 DIAGNOSIS — I493 Ventricular premature depolarization: Secondary | ICD-10-CM

## 2012-06-03 DIAGNOSIS — I469 Cardiac arrest, cause unspecified: Secondary | ICD-10-CM

## 2012-06-03 DIAGNOSIS — I4949 Other premature depolarization: Secondary | ICD-10-CM

## 2012-06-03 DIAGNOSIS — Z95810 Presence of automatic (implantable) cardiac defibrillator: Secondary | ICD-10-CM

## 2012-06-03 DIAGNOSIS — R0602 Shortness of breath: Secondary | ICD-10-CM

## 2012-06-03 LAB — ICD DEVICE OBSERVATION
AL THRESHOLD: 0.625 V
BAMS-0001: 170 {beats}/min
CHARGE TIME: 3.613 s
DEV-0020ICD: NEGATIVE
FVT: 0
PACEART VT: 0
TOT-0001: 1
TOT-0002: 0
TZAT-0001ATACH: 2
TZAT-0001ATACH: 3
TZAT-0001FASTVT: 1
TZAT-0001SLOWVT: 1
TZAT-0002ATACH: NEGATIVE
TZAT-0002FASTVT: NEGATIVE
TZAT-0012ATACH: 150 ms
TZAT-0012ATACH: 150 ms
TZAT-0012ATACH: 150 ms
TZAT-0012SLOWVT: 170 ms
TZAT-0018ATACH: NEGATIVE
TZAT-0018FASTVT: NEGATIVE
TZAT-0018SLOWVT: NEGATIVE
TZAT-0019ATACH: 6 V
TZAT-0019FASTVT: 8 V
TZAT-0019SLOWVT: 8 V
TZAT-0020ATACH: 1.5 ms
TZAT-0020ATACH: 1.5 ms
TZAT-0020SLOWVT: 1.5 ms
TZON-0003ATACH: 350 ms
TZON-0003SLOWVT: 360 ms
TZON-0003VSLOWVT: 450 ms
TZON-0004SLOWVT: 16
TZON-0004VSLOWVT: 20
TZON-0005SLOWVT: 12
TZST-0001ATACH: 4
TZST-0001ATACH: 6
TZST-0001FASTVT: 2
TZST-0001FASTVT: 3
TZST-0001FASTVT: 6
TZST-0001SLOWVT: 2
TZST-0001SLOWVT: 6
TZST-0002ATACH: NEGATIVE
TZST-0002ATACH: NEGATIVE
TZST-0002FASTVT: NEGATIVE
TZST-0002FASTVT: NEGATIVE
TZST-0002FASTVT: NEGATIVE
TZST-0002SLOWVT: NEGATIVE
TZST-0002SLOWVT: NEGATIVE
TZST-0002SLOWVT: NEGATIVE
VENTRICULAR PACING ICD: 0.05 pct

## 2012-06-03 NOTE — Progress Notes (Signed)
Patient Care Team: Sherlene Shams, MD as PCP - General (Internal Medicine)   HPI  Nicole Swanson is a 56 y.o. female Seen in followup for ICD implanted for aborted cardiac arrest. Her care initially had been at [facility]. She is status post dual chamber ICD implantation and generator replacement was recently done by me summer 2013  Echocardiogram 2011 demonstrated normal left ventricular function valve function and dimension sizes  She also has a history of ventricular ectopy for which she has been on sotalol in which Dr. Robin Searing was going to increase prior to her transferring her care to [facility]  She has some complaints of shortness of breath which she attributes to "normal for her allergies"  Past Medical History  Diagnosis Date  . Aborted cardiac arrest 1994  . DVT (deep vein thrombosis) in pregnancy 1986    had heparin shots followed by coumadin  . Dual implantable cardiac defibrillator     Medtronic initially implanted in 1997  . 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 Irritable bowel syndrome   . PVC's (premature ventricular contractions)   . Ventricular tachycardia   . Family history of seizures     Question neurological versus cardiac    Past Surgical History  Procedure Date  . Cardiac defibrillator placement 1994    Dual chamber with pacemaker  . Nasal septum surgery 2006    [name]     Current Outpatient Prescriptions  Medication Sig Dispense Refill  . albuterol (PROVENTIL HFA;VENTOLIN HFA) 108 (90 BASE) MCG/ACT inhaler Inhale 2 puffs into the lungs every 6 (six) hours as needed.  18 g  5  . aspirin 81 MG tablet Take 81 mg by mouth daily.        . beclomethasone (QVAR) 40 MCG/ACT inhaler Inhale 2 puffs into the lungs 2 (two) times daily as needed.      . celecoxib (CELEBREX) 200 MG capsule Take 200 mg by mouth 2 (two) times daily.      . clidinium-chlordiazePOXIDE (LIBRAX) 2.5-5 MG per capsule Take 1 capsule by mouth 4 (four) times daily as  needed.        Marland Kitchen dexlansoprazole (DEXILANT) 60 MG capsule Take 1 capsule (60 mg total) by mouth daily.  30 capsule  5  . estrogen-methylTESTOSTERone (ESTRATEST) 1.25-2.5 MG per tablet Take 1 tablet by mouth 2 (two) times a week. Tuesday and Friday      . fexofenadine (ALLEGRA) 180 MG tablet Take 1 tablet (180 mg total) by mouth daily.  30 tablet  5  . fluticasone (FLONASE) 50 MCG/ACT nasal spray Place 1 spray into the nose daily.  16 g  5  . montelukast (SINGULAIR) 10 MG tablet Take 1 tablet (10 mg total) by mouth at bedtime.  30 tablet  5  . sotalol (BETAPACE) 120 MG tablet Take 1 tablet (120 mg total) by mouth 2 (two) times daily.  180 tablet  3    Allergies  Allergen Reactions  . Penicillins Rash  . Vancomycin Rash    Review of Systems negative except from HPI and PMH  Physical Exam BP 118/70  Pulse 61  Ht 5\' 7"  (1.702 m)  Wt 155 lb (70.308 kg)  BMI 24.28 kg/m2 Well developed and well nourished in no acute distress HENT normal E scleral and icterus clear Neck Supple JVP flat; carotids brisk and full Clear to ausculation Well developed and nourished in no acute distress HENT normal Neck supple with JVP-flat Clear Regular rate and rhythm,  no murmurs or gallops Abd-soft with active BS No Clubbing cyanosis edema Skin-warm and dry A & Oriented  Grossly normal sensory and motor function Regular rate and rhythm, no murmurs gallops or rub Soft with active bowel sounds No clubbing cyanosis none Edema Alert and oriented, grossly normal motor and sensory function Skin Warm and Dry  Electrocardiogram demonstrates atrial pacing with intrinsic conduction with intervals 18/08/43  Assessment and  Plan

## 2012-06-03 NOTE — Assessment & Plan Note (Signed)
As above.

## 2012-06-03 NOTE — Assessment & Plan Note (Signed)
These comprise about 6% of her heart beats. In the past there has been no evidence of left ventricular dysfunction. I worry a little bit about her shortness of breath which she attributes to allergies but may be related to cardiomyopathy in the setting of his PVC burden. She also has an elevation in her optivol although she is not crossed threshold suggesting either a degree in the measurement or accumulation of fluid possibly associated with her Celebrex

## 2012-06-03 NOTE — Assessment & Plan Note (Signed)
The cause of this remains unclear I will try and get back up with Dr. Robin Searing to query her regarding that was found and whether genetic testing in her mind might be appropriate

## 2012-06-03 NOTE — Assessment & Plan Note (Signed)
The patient's device was interrogated.  The information was reviewed. No changes were made in the programming.    

## 2012-06-03 NOTE — Patient Instructions (Signed)
Remote monitoring is used to monitor your Pacemaker of ICD from home. This monitoring reduces the number of office visits required to check your device to one time per year. It allows [place] to keep an eye on the functioning of your device to ensure it is working properly. You are scheduled for a device check from home in 2 weeks. You may send your transmission at any time that day. If you have a wireless device, the transmission will be sent automatically. After your physician reviews your transmission, you will receive a postcard with your next transmission date.  Your physician wants you to follow-up in: 6 months with Dr. Graciela Husbands. You will receive a reminder letter in the mail two months in advance. If you don't receive a letter, please call our office to schedule the follow-up appointment.

## 2012-06-23 ENCOUNTER — Telehealth: Payer: Self-pay | Admitting: Internal Medicine

## 2012-06-23 ENCOUNTER — Ambulatory Visit (INDEPENDENT_AMBULATORY_CARE_PROVIDER_SITE_OTHER): Payer: PRIVATE HEALTH INSURANCE | Admitting: *Deleted

## 2012-06-23 ENCOUNTER — Encounter: Payer: Self-pay | Admitting: Internal Medicine

## 2012-06-23 DIAGNOSIS — R0602 Shortness of breath: Secondary | ICD-10-CM

## 2012-06-23 NOTE — Telephone Encounter (Signed)
Pt calling wants to know if her transmission was ok and she is still having some SOB and if it was fine then she wants to know if she should see a pulmonary md

## 2012-06-23 NOTE — Telephone Encounter (Signed)
Patient aware that we did not receive transmission.  She will resend at lunch today.

## 2012-06-23 NOTE — Telephone Encounter (Signed)
Transmission was received. All was normal with transmission.

## 2012-06-27 LAB — REMOTE ICD DEVICE
AL AMPLITUDE: 2.6 mv
AL IMPEDENCE ICD: 494 Ohm
ATRIAL PACING ICD: 71.75 pct
RV LEAD AMPLITUDE: 9.1 mv
RV LEAD IMPEDENCE ICD: 456 Ohm
TOT-0001: 1
TOT-0006: 20130722000000
TZAT-0001FASTVT: 1
TZAT-0001SLOWVT: 1
TZAT-0002ATACH: NEGATIVE
TZAT-0002ATACH: NEGATIVE
TZAT-0002FASTVT: NEGATIVE
TZAT-0002SLOWVT: NEGATIVE
TZAT-0012ATACH: 150 ms
TZAT-0012FASTVT: 170 ms
TZAT-0018ATACH: NEGATIVE
TZAT-0019ATACH: 6 V
TZAT-0019ATACH: 6 V
TZAT-0020ATACH: 1.5 ms
TZAT-0020ATACH: 1.5 ms
TZAT-0020SLOWVT: 1.5 ms
TZON-0003SLOWVT: 360 ms
TZST-0001FASTVT: 4
TZST-0001FASTVT: 5
TZST-0001SLOWVT: 3
TZST-0001SLOWVT: 5
TZST-0002FASTVT: NEGATIVE
TZST-0002FASTVT: NEGATIVE
TZST-0002FASTVT: NEGATIVE
TZST-0002SLOWVT: NEGATIVE

## 2012-07-02 ENCOUNTER — Telehealth: Payer: Self-pay | Admitting: Internal Medicine

## 2012-07-02 MED ORDER — CELECOXIB 200 MG PO CAPS: 200.0000 mg | ORAL_CAPSULE | Freq: Two times a day (BID) | ORAL | Status: DC

## 2012-07-02 NOTE — Telephone Encounter (Signed)
Refill request for celebrex 200 mg capsule 60 cap Sig: take one capsule by mouth 2 times a day

## 2012-07-04 DIAGNOSIS — 419620001 Death: Secondary | SNOMED CT

## 2012-07-04 DEATH — deceased

## 2012-07-29 ENCOUNTER — Encounter: Payer: Self-pay | Admitting: *Deleted

## 2012-09-08 ENCOUNTER — Encounter: Payer: Self-pay | Admitting: Internal Medicine

## 2012-09-08 ENCOUNTER — Ambulatory Visit (INDEPENDENT_AMBULATORY_CARE_PROVIDER_SITE_OTHER): Payer: PRIVATE HEALTH INSURANCE | Admitting: *Deleted

## 2012-09-08 DIAGNOSIS — R0602 Shortness of breath: Secondary | ICD-10-CM

## 2012-09-08 DIAGNOSIS — I469 Cardiac arrest, cause unspecified: Secondary | ICD-10-CM

## 2012-09-08 DIAGNOSIS — Z95810 Presence of automatic (implantable) cardiac defibrillator: Secondary | ICD-10-CM

## 2012-09-11 LAB — REMOTE ICD DEVICE
AL THRESHOLD: 0.625 V
BATTERY VOLTAGE: 3.2007 V
DEV-0020ICD: NEGATIVE
PACEART VT: 0
RV LEAD AMPLITUDE: 9.4 mv
RV LEAD THRESHOLD: 0.75 V
TOT-0006: 20130722000000
TZAT-0001ATACH: 1
TZAT-0001ATACH: 2
TZAT-0001ATACH: 3
TZAT-0001SLOWVT: 1
TZAT-0002ATACH: NEGATIVE
TZAT-0002ATACH: NEGATIVE
TZAT-0002SLOWVT: NEGATIVE
TZAT-0012ATACH: 150 ms
TZAT-0012ATACH: 150 ms
TZAT-0012SLOWVT: 170 ms
TZAT-0018ATACH: NEGATIVE
TZAT-0018ATACH: NEGATIVE
TZAT-0018ATACH: NEGATIVE
TZAT-0018FASTVT: NEGATIVE
TZAT-0018SLOWVT: NEGATIVE
TZAT-0019ATACH: 6 V
TZAT-0019ATACH: 6 V
TZAT-0019FASTVT: 8 V
TZAT-0019SLOWVT: 8 V
TZAT-0020FASTVT: 1.5 ms
TZON-0003ATACH: 350 ms
TZON-0005SLOWVT: 12
TZST-0001ATACH: 4
TZST-0001ATACH: 5
TZST-0001FASTVT: 3
TZST-0001FASTVT: 5
TZST-0001FASTVT: 6
TZST-0001SLOWVT: 4
TZST-0001SLOWVT: 5
TZST-0001SLOWVT: 6
TZST-0002ATACH: NEGATIVE
TZST-0002ATACH: NEGATIVE
TZST-0002FASTVT: NEGATIVE
TZST-0002SLOWVT: NEGATIVE
TZST-0002SLOWVT: NEGATIVE
TZST-0002SLOWVT: NEGATIVE
TZST-0002SLOWVT: NEGATIVE
VENTRICULAR PACING ICD: 0.04 pct
VF: 0

## 2012-09-29 ENCOUNTER — Encounter: Payer: Self-pay | Admitting: *Deleted

## 2012-10-04 DIAGNOSIS — 419620001 Death: Secondary | SNOMED CT

## 2012-10-04 DEATH — deceased

## 2012-12-05 ENCOUNTER — Ambulatory Visit (INDEPENDENT_AMBULATORY_CARE_PROVIDER_SITE_OTHER): Payer: 59 | Admitting: Adult Health

## 2012-12-05 ENCOUNTER — Encounter: Payer: Self-pay | Admitting: Adult Health

## 2012-12-05 VITALS — BP 104/71 | HR 70 | Temp 97.5°F | Resp 14 | Ht 67.5 in | Wt 156.0 lb

## 2012-12-05 DIAGNOSIS — M545 Low back pain, unspecified: Secondary | ICD-10-CM | POA: Insufficient documentation

## 2012-12-05 DIAGNOSIS — Z1239 Encounter for other screening for malignant neoplasm of breast: Secondary | ICD-10-CM

## 2012-12-05 MED ORDER — DEXLANSOPRAZOLE 60 MG PO CPDR: 60.0000 mg | DELAYED_RELEASE_CAPSULE | Freq: Every day | ORAL | Status: DC

## 2012-12-05 MED ORDER — NABUMETONE 500 MG PO TABS: 500.0000 mg | ORAL_TABLET | Freq: Two times a day (BID) | ORAL | Status: DC

## 2012-12-05 MED ORDER — CILIDINIUM-CHLORDIAZEPOXIDE 2.5-5 MG PO CAPS: 1.0000 | ORAL_CAPSULE | Freq: Three times a day (TID) | ORAL | Status: DC

## 2012-12-05 NOTE — Assessment & Plan Note (Signed)
Exacerbation of her chronic low back pain secondary to disc displacement. She has been using Relafen with good results. She does not feel that her Celebrex has been helping her much anymore. This may be secondary to her developing a tolerance for this medication. I will switch her to Relafen and discontinue the Celebrex for now. Discussed referral to chiropractor. She is agreeable to try this. I am referring her to [facility] chirop[facility] in [place].

## 2012-12-05 NOTE — Progress Notes (Signed)
  Subjective:    Patient ID: Nicole Swanson, female    DOB: 1956-02-19, 57 y.o.   MRN: [id number]  HPI  Patient is a pleasant 57 year old female with history of chronic back pain who presents to clinic with exacerbation of low back pain, tingling and numbness down the left leg (this is not a new finding), and now with knee pain. She reports that her legs "just ache". She denies weakness. She has had problems with back pain for years. She had some left over nabumetone from a previous exacerbation and feels this helped. She is improved with standing. Sitting makes it worse. Note, patient is under a lot of stress as she is caring for her husband with stage IV lung cancer.  Also, patient received a letter from [facility] stating that she was overdue for her mammogram. She would like a referral for a screening mammogram.   Review of Systems  Musculoskeletal: Positive for back pain and arthralgias.       Bilateral knee aches/pain  Neurological: Positive for numbness.       Tingling down left leg - ongoing    BP 104/71  Pulse 70  Temp(Src) 97.5 F (36.4 C) (Oral)  Resp 14  Ht 5' 7.5" (1.715 m)  Wt 156 lb (70.761 kg)  BMI 24.06 kg/m2  SpO2 96%   Objective:   Physical Exam  Constitutional: She is oriented to person, place, and time. She appears well-developed and well-nourished. No distress.  Musculoskeletal: She exhibits tenderness. She exhibits no edema.  Bilateral knees. Right knee with excellent ROM. There is crepitus of the left knee.  Neurological: She is alert and oriented to person, place, and time.  Skin: Skin is warm and dry.  Psychiatric: She has a normal mood and affect. Her behavior is normal. Judgment and thought content normal.          Assessment & Plan:

## 2012-12-25 ENCOUNTER — Ambulatory Visit (INDEPENDENT_AMBULATORY_CARE_PROVIDER_SITE_OTHER): Payer: Managed Care, Other (non HMO) | Admitting: Internal Medicine

## 2012-12-25 ENCOUNTER — Encounter: Payer: Self-pay | Admitting: Internal Medicine

## 2012-12-25 VITALS — BP 100/60 | HR 66 | Ht 67.5 in | Wt 157.2 lb

## 2012-12-25 DIAGNOSIS — R5381 Other malaise: Secondary | ICD-10-CM

## 2012-12-25 DIAGNOSIS — Z79899 Other long term (current) drug therapy: Secondary | ICD-10-CM

## 2012-12-25 DIAGNOSIS — I4949 Other premature depolarization: Secondary | ICD-10-CM

## 2012-12-25 DIAGNOSIS — Z95810 Presence of automatic (implantable) cardiac defibrillator: Secondary | ICD-10-CM

## 2012-12-25 DIAGNOSIS — I469 Cardiac arrest, cause unspecified: Secondary | ICD-10-CM

## 2012-12-25 DIAGNOSIS — I493 Ventricular premature depolarization: Secondary | ICD-10-CM

## 2012-12-25 DIAGNOSIS — R5383 Other fatigue: Secondary | ICD-10-CM

## 2012-12-25 LAB — ICD DEVICE OBSERVATION
AL THRESHOLD: 0.75 V
ATRIAL PACING ICD: 70.09 pct
BATTERY VOLTAGE: 3.1832 V
CHARGE TIME: 8.167 s
DEV-0020ICD: NEGATIVE
PACEART VT: 0
TOT-0002: 0
TZAT-0001ATACH: 2
TZAT-0001ATACH: 3
TZAT-0002ATACH: NEGATIVE
TZAT-0002FASTVT: NEGATIVE
TZAT-0012ATACH: 150 ms
TZAT-0012ATACH: 150 ms
TZAT-0018ATACH: NEGATIVE
TZAT-0018ATACH: NEGATIVE
TZAT-0018ATACH: NEGATIVE
TZAT-0018SLOWVT: NEGATIVE
TZAT-0019FASTVT: 8 V
TZAT-0019SLOWVT: 8 V
TZAT-0020ATACH: 1.5 ms
TZAT-0020FASTVT: 1.5 ms
TZON-0003VSLOWVT: 450 ms
TZON-0004SLOWVT: 16
TZON-0004VSLOWVT: 20
TZON-0005SLOWVT: 12
TZST-0001ATACH: 6
TZST-0001FASTVT: 3
TZST-0001SLOWVT: 2
TZST-0001SLOWVT: 6
TZST-0002ATACH: NEGATIVE
TZST-0002ATACH: NEGATIVE
TZST-0002ATACH: NEGATIVE
TZST-0002FASTVT: NEGATIVE
TZST-0002FASTVT: NEGATIVE
TZST-0002FASTVT: NEGATIVE
TZST-0002SLOWVT: NEGATIVE
TZST-0002SLOWVT: NEGATIVE
VENTRICULAR PACING ICD: 0.04 pct

## 2012-12-25 NOTE — Patient Instructions (Addendum)
Order for Bmet to be drawn at [facility].  Follow up with Dr. Graciela Husbands in 12 months.

## 2012-12-25 NOTE — Assessment & Plan Note (Addendum)
Queiscient;  1she also has a history of ventricular tachycardia on sotalol. We will check her metabolic profile

## 2012-12-25 NOTE — Assessment & Plan Note (Signed)
Details of original event are still sparse

## 2012-12-25 NOTE — Assessment & Plan Note (Signed)
The patient's device was interrogated.  The information was reviewed. No changes were made in the programming.    

## 2012-12-25 NOTE — Progress Notes (Signed)
Nicole Swanson F.skf Patient Care Team: Sherlene Shams, MD as PCP - General (Internal Medicine)   HPI  Nicole Swanson is a 57 y.o. female Seen in followup for ICD implanted for aborted cardiac arrest. Her care initially had been at [facility]. She is status post dual chamber ICD implantation and generator replacement was recently done by me summer 2013   Echocardiogram 2011 demonstrated normal left ventricular function valve function and dimension sizes   She also has a history of ventricular ectopy for which she has been on sotalol in which Dr. Robin Searing was going to increase prior to her transferring her care to [facility]  Available records were reviewed today but they were unenlightening.  She continues to be tired. Her husband has stage IV lung cancer. The work at [facility] since the larger has been greater     Past Medical History  Diagnosis Date  . Aborted cardiac arrest 1994  . DVT (deep vein thrombosis) in pregnancy 1986    had heparin shots followed by coumadin  . Dual implantable cardiac defibrillator     Medtronic initially implanted in 1997  . 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 Irritable bowel syndrome   . PVC's (premature ventricular contractions)   . Ventricular tachycardia   . Family history of seizures     Question neurological versus cardiac    Past Surgical History  Procedure Laterality Date  . Cardiac defibrillator placement  1994    Dual chamber with pacemaker  . Nasal septum surgery  2006    [name]     Current Outpatient Prescriptions  Medication Sig Dispense Refill  . albuterol (PROVENTIL HFA;VENTOLIN HFA) 108 (90 BASE) MCG/ACT inhaler Inhale 2 puffs into the lungs every 6 (six) hours as needed.  18 g  5  . aspirin 81 MG tablet Take 81 mg by mouth daily.        . budesonide-formoterol (SYMBICORT) 80-4.5 MCG/ACT inhaler Inhale 2 puffs into the lungs 2 (two) times daily.      . clidinium-chlordiazePOXIDE (LIBRAX) 2.5-5 MG per capsule Take 1  capsule by mouth 4 (four) times daily -  before meals and at bedtime. As needed.      Marland Kitchen dexlansoprazole (DEXILANT) 60 MG capsule Take 1 capsule (60 mg total) by mouth daily.  30 capsule  5  . docusate sodium (COLACE) 250 MG capsule Take 250 mg by mouth 2 (two) times daily.      Marland Kitchen estrogen-methylTESTOSTERone (ESTRATEST) 1.25-2.5 MG per tablet Take 1 tablet by mouth 2 (two) times a week. Tuesday and Friday      . fexofenadine (ALLEGRA) 180 MG tablet Take 1 tablet (180 mg total) by mouth daily.  30 tablet  5  . fluticasone (FLONASE) 50 MCG/ACT nasal spray Place 1 spray into the nose daily.  16 g  5  . montelukast (SINGULAIR) 10 MG tablet Take 1 tablet (10 mg total) by mouth at bedtime.  30 tablet  5  . nabumetone (RELAFEN) 500 MG tablet Take 1 tablet (500 mg total) by mouth 2 (two) times daily.  60 tablet  5  . sotalol (BETAPACE) 120 MG tablet Take 1 tablet (120 mg total) by mouth 2 (two) times daily.  180 tablet  3   No current facility-administered medications for this visit.    Allergies  Allergen Reactions  . Penicillins Rash  . Vancomycin Rash    Review of Systems negative except from HPI and PMH  Physical Exam BP 100/60  Pulse  66  Ht 5' 7.5" (1.715 m)  Wt 157 lb 4 oz (71.328 kg)  BMI 24.25 kg/m2 Well developed and nourished in no acute distress HENT normal Neck supple with JVP-flat Clear Regular rate and rhythm, no murmurs or gallops Abd-soft with active BS No Clubbing cyanosis edema Skin-warm and dry A & Oriented  Grossly normal sensory and motor function Device pocket well healed; without hematoma or erythema    Assessment and  Plan

## 2012-12-30 ENCOUNTER — Telehealth: Payer: Self-pay

## 2012-12-30 NOTE — Telephone Encounter (Signed)
LMTCB ZO:XWRU done at [facility]

## 2012-12-31 NOTE — Telephone Encounter (Signed)
Lmtcb.

## 2013-01-01 DIAGNOSIS — 419620001 Death: Secondary | SNOMED CT

## 2013-01-01 DEATH — deceased

## 2013-01-08 NOTE — Telephone Encounter (Signed)
I left a message at the patient's home # to call.

## 2013-01-14 LAB — BASIC METABOLIC PANEL
CO2: 26 mmol/L (ref 19–28)
Calcium: 9.9 mg/dL (ref 8.7–10.2)
Chloride: 102 mmol/L (ref 97–108)
Creatinine, Ser: 0.83 mg/dL (ref 0.57–1.00)
Potassium: 4.3 mmol/L (ref 3.5–5.2)
Sodium: 140 mmol/L (ref 134–144)

## 2013-02-18 ENCOUNTER — Ambulatory Visit: Payer: Self-pay | Admitting: Internal Medicine

## 2013-03-02 ENCOUNTER — Other Ambulatory Visit: Payer: Self-pay | Admitting: *Deleted

## 2013-03-02 MED ORDER — MONTELUKAST SODIUM 10 MG PO TABS: 10.0000 mg | ORAL_TABLET | Freq: Every day | ORAL | Status: DC

## 2013-03-20 ENCOUNTER — Encounter: Payer: Self-pay | Admitting: Internal Medicine

## 2013-03-30 ENCOUNTER — Ambulatory Visit (INDEPENDENT_AMBULATORY_CARE_PROVIDER_SITE_OTHER): Payer: 59 | Admitting: *Deleted

## 2013-03-30 DIAGNOSIS — I469 Cardiac arrest, cause unspecified: Secondary | ICD-10-CM

## 2013-03-30 DIAGNOSIS — Z95810 Presence of automatic (implantable) cardiac defibrillator: Secondary | ICD-10-CM

## 2013-04-03 DIAGNOSIS — 419620001 Death: Secondary | SNOMED CT

## 2013-04-03 DEATH — deceased

## 2013-04-06 ENCOUNTER — Other Ambulatory Visit: Payer: Self-pay | Admitting: *Deleted

## 2013-04-06 LAB — REMOTE ICD DEVICE
AL IMPEDENCE ICD: 456 Ohm
AL THRESHOLD: 0.625 V
ATRIAL PACING ICD: 67.8 pct
CHARGE TIME: 8.348 s
DEV-0020ICD: NEGATIVE
PACEART VT: 0
RV LEAD THRESHOLD: 0.75 V
TOT-0006: 20130722000000
TZAT-0001ATACH: 1
TZAT-0001ATACH: 2
TZAT-0001ATACH: 3
TZAT-0002ATACH: NEGATIVE
TZAT-0002ATACH: NEGATIVE
TZAT-0002ATACH: NEGATIVE
TZAT-0002SLOWVT: NEGATIVE
TZAT-0012ATACH: 150 ms
TZAT-0018ATACH: NEGATIVE
TZAT-0018ATACH: NEGATIVE
TZAT-0018SLOWVT: NEGATIVE
TZAT-0019ATACH: 6 V
TZAT-0019ATACH: 6 V
TZAT-0019FASTVT: 8 V
TZAT-0019SLOWVT: 8 V
TZAT-0020FASTVT: 1.5 ms
TZAT-0020SLOWVT: 1.5 ms
TZON-0003SLOWVT: 360 ms
TZON-0004SLOWVT: 16
TZON-0004VSLOWVT: 20
TZON-0005SLOWVT: 12
TZST-0001ATACH: 5
TZST-0001ATACH: 6
TZST-0001FASTVT: 2
TZST-0001FASTVT: 3
TZST-0001FASTVT: 5
TZST-0001SLOWVT: 2
TZST-0001SLOWVT: 3
TZST-0001SLOWVT: 6
TZST-0002ATACH: NEGATIVE
TZST-0002ATACH: NEGATIVE
TZST-0002FASTVT: NEGATIVE
TZST-0002FASTVT: NEGATIVE
TZST-0002FASTVT: NEGATIVE
TZST-0002SLOWVT: NEGATIVE
TZST-0002SLOWVT: NEGATIVE
TZST-0002SLOWVT: NEGATIVE
VENTRICULAR PACING ICD: 0.05 pct
VF: 0

## 2013-04-06 MED ORDER — MONTELUKAST SODIUM 10 MG PO TABS: 10.0000 mg | ORAL_TABLET | Freq: Every day | ORAL | Status: DC

## 2013-04-06 NOTE — Telephone Encounter (Signed)
Refill Request  Convaryx H.S. Tablet   #90  Take one tablet by mouth every day

## 2013-04-17 ENCOUNTER — Ambulatory Visit (INDEPENDENT_AMBULATORY_CARE_PROVIDER_SITE_OTHER): Payer: 59 | Admitting: Internal Medicine

## 2013-04-17 ENCOUNTER — Encounter: Payer: Self-pay | Admitting: Internal Medicine

## 2013-04-17 VITALS — BP 104/70 | HR 85 | Temp 98.6°F | Resp 12 | Wt 155.0 lb

## 2013-04-17 DIAGNOSIS — I7300 Raynaud's syndrome without gangrene: Secondary | ICD-10-CM | POA: Insufficient documentation

## 2013-04-17 DIAGNOSIS — J3489 Other specified disorders of nose and nasal sinuses: Secondary | ICD-10-CM

## 2013-04-17 DIAGNOSIS — R0981 Nasal congestion: Secondary | ICD-10-CM | POA: Insufficient documentation

## 2013-04-17 MED ORDER — EST ESTROGENS-METHYLTEST 1.25-2.5 MG PO TABS: 1.0000 | ORAL_TABLET | ORAL | Status: DC

## 2013-04-17 MED ORDER — MONTELUKAST SODIUM 10 MG PO TABS: 10.0000 mg | ORAL_TABLET | Freq: Every day | ORAL | Status: DC

## 2013-04-17 MED ORDER — AZITHROMYCIN 500 MG PO TABS: 500.0000 mg | ORAL_TABLET | Freq: Every day | ORAL | Status: DC

## 2013-04-17 NOTE — Assessment & Plan Note (Signed)
Discussed behavioral and environmental avoidance as prevention. Trial  of calcium channel blocker offered but she runs a low blood pressure due to use of S. sotalol so she would prefer to avoid this. given her esophageal issues we did discuss the possibility of CREST syndrome she does not meet criteria at present.

## 2013-04-17 NOTE — Assessment & Plan Note (Signed)
HEENT exam is normal today. Recommended increasing use of saline lavage to twice daily and when necessary use of Afrin. No indication currently for antibiotics but I have given her prescription for azithromycin should she develop purulent drainage fevers or ear pain.

## 2013-04-17 NOTE — Patient Instructions (Addendum)
Increase your use of saline flushes two twice daily and try using Afrin on the left side  If you develop fever,  Purulent sinsu drainage or ear pain,  Start the antibiotic

## 2013-04-17 NOTE — Progress Notes (Signed)
Patient ID: Nicole Swanson, female   DOB: 08/23/1956, 57 y.o.   MRN: [id number]   Patient Active Problem List   Diagnosis Date Noted  . Raynaud phenomenon 04/17/2013  . Sinus congestion 04/17/2013  . Low back pain 12/05/2012  . Shortness of breath 06/28/2012  . Aborted cardiac arrest   . ICD-Medtronic   . PVC's / ventricular tachycardia   . 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 Irritable bowel syndrome     Subjective:  CC:   Chief Complaint  Patient presents with  . Follow-up    script renewal    HPI:   Nicole Yankee Priestis a 57 y.o. female who presents 6 month follow up on chronic conditions including perennial rhinitis managed with allegra, singulair and flonase.   1)  Has been having increased pressure on the left maxillary side for several days. Left ear has been popping a little bit. She she denies fevers tooth pain and purulent drainage, but has had an altered sense of taste and altered smell on that side.  She uses simply saline sterile rinse once a day.  2) Problems with her circulation. Patient states that for the past several months she has noted that the fingers on both hands turn white and loose to her circulation intermittently. It is aggravated by contact with cold objects. The fingers do not turn blue. She has not had any pain or discoloration of her fingernail beds. She has occasional trouble swallowing her pills but she takes them all at once with a goal of water. She has had no regurgitation and no choking episodes. She does have a history of reflux. No prior dilations or EGDs    Past Medical History  Diagnosis Date  . Aborted cardiac arrest 1994  . DVT (deep vein thrombosis) in pregnancy 1986    had heparin shots followed by coumadin  . Dual implantable cardiac defibrillator     Medtronic initially implanted in 1997  . 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 Irritable bowel syndrome   . PVC's (premature ventricular contractions)    . Ventricular tachycardia   . Family history of seizures     Question neurological versus cardiac    Past Surgical History  Procedure Laterality Date  . Cardiac defibrillator placement  1994    Dual chamber with pacemaker  . Nasal septum surgery  2006    Nicole Swanson        The following portions of the patient's history were reviewed and updated as appropriate: Allergies, current medications, and problem list.    Review of Systems:   12 Pt  review of systems was negative except those addressed in the HPI,     History   Social History  . Marital Status: Married    Spouse Name: N/A    Number of Children: N/A  . Years of Education: N/A   Occupational History  . Not on file.   Social History Main Topics  . Smoking status: Former Smoker    Types: Cigarettes    Quit date: 06/04/1995  . Smokeless tobacco: Never Used  . Alcohol Use: Yes     Comment: occasional  . Drug Use: No  . Sexual Activity: Not on file   Other Topics Concern  . Not on file   Social History Narrative  . No narrative on file    Objective:  Filed Vitals:   04/17/13 1650  BP: 104/70  Pulse: 85  Temp: 98.6  F (37 C)  Resp: 12     General appearance: alert, cooperative and appears stated age Ears: normal TM's and external ear canals both ears Throat: lips, mucosa, and tongue normal; teeth and gums normal Neck: no adenopathy, no carotid bruit, supple, symmetrical, trachea midline and thyroid not enlarged, symmetric, no tenderness/mass/nodules Back: symmetric, no curvature. ROM normal. No CVA tenderness. Lungs: clear to auscultation bilaterally Heart: regular rate and rhythm, S1, S2 normal, no murmur, click, rub or gallop Abdomen: soft, non-tender; bowel sounds normal; no masses,  no organomegaly Pulses: 2+ and symmetric including rdial pulses.  Skin: Skin color, texture, turgor normal. No rashes or lesions.  No skin changes suggestive of her okay scleroderma Lymph nodes:  Cervical, supraclavicular, and axillary nodes normal.  Assessment and Plan:  Raynaud phenomenon Discussed behavioral and environmental avoidance as prevention. Trial  of calcium channel blocker offered but she runs a low blood pressure due to use of S. sotalol so she would prefer to avoid this. given her esophageal issues we did discuss the possibility of CREST syndrome she does not meet criteria at present.   Sinus congestion HEENT exam is normal today. Recommended increasing use of saline lavage to twice daily and when necessary use of Afrin. No indication currently for antibiotics but I have given her prescription for azithromycin should she develop purulent drainage fevers or ear pain.   Updated Medication List Outpatient Encounter Prescriptions as of 04/17/2013  Medication Sig Dispense Refill  . albuterol (PROVENTIL HFA;VENTOLIN HFA) 108 (90 BASE) MCG/ACT inhaler Inhale 2 puffs into the lungs every 6 (six) hours as needed.  18 g  5  . aspirin 81 MG tablet Take 81 mg by mouth daily.        . celecoxib (CELEBREX) 100 MG capsule Take 100 mg by mouth 2 (two) times daily.      . clidinium-chlordiazePOXIDE (LIBRAX) 2.5-5 MG per capsule Take 1 capsule by mouth 4 (four) times daily -  before meals and at bedtime. As needed.      Marland Kitchen dexlansoprazole (DEXILANT) 60 MG capsule Take 1 capsule (60 mg total) by mouth daily.  30 capsule  5  . docusate sodium (COLACE) 250 MG capsule Take 250 mg by mouth 2 (two) times daily.      Marland Kitchen estrogen-methylTESTOSTERone (ESTRATEST) 1.25-2.5 MG per tablet Take 1 tablet by mouth 2 (two) times a week. Tuesday and Friday  30 tablet  3  . fexofenadine (ALLEGRA) 180 MG tablet Take 1 tablet (180 mg total) by mouth daily.  30 tablet  5  . fluticasone (FLONASE) 50 MCG/ACT nasal spray Place 1 spray into the nose daily.  16 g  5  . montelukast (SINGULAIR) 10 MG tablet Take 1 tablet (10 mg total) by mouth at bedtime.  90 tablet  1  . sotalol (BETAPACE) 120 MG tablet Take 1 tablet  (120 mg total) by mouth 2 (two) times daily.  180 tablet  3  . [DISCONTINUED] estrogen-methylTESTOSTERone (ESTRATEST) 1.25-2.5 MG per tablet Take 1 tablet by mouth 2 (two) times a week. Tuesday and Friday      . [DISCONTINUED] montelukast (SINGULAIR) 10 MG tablet Take 1 tablet (10 mg total) by mouth at bedtime.  30 tablet  0  . azithromycin (ZITHROMAX) 500 MG tablet Take 1 tablet (500 mg total) by mouth daily.  7 tablet  0  . budesonide-formoterol (SYMBICORT) 80-4.5 MCG/ACT inhaler Inhale 2 puffs into the lungs 2 (two) times daily.      . nabumetone (RELAFEN) 500 MG  tablet Take 1 tablet (500 mg total) by mouth 2 (two) times daily.  60 tablet  5   No facility-administered encounter medications on file as of 04/17/2013.     No orders of the defined types were placed in this encounter.    No Follow-up on file.      He oh

## 2013-04-29 ENCOUNTER — Encounter: Payer: Self-pay | Admitting: *Deleted

## 2013-05-12 ENCOUNTER — Encounter: Payer: Self-pay | Admitting: Internal Medicine

## 2013-05-26 ENCOUNTER — Other Ambulatory Visit: Payer: Self-pay

## 2013-05-26 DIAGNOSIS — I469 Cardiac arrest, cause unspecified: Secondary | ICD-10-CM

## 2013-05-26 MED ORDER — SOTALOL HCL 120 MG PO TABS: 120.0000 mg | ORAL_TABLET | Freq: Two times a day (BID) | ORAL | Status: DC

## 2013-06-04 ENCOUNTER — Other Ambulatory Visit: Payer: Self-pay | Admitting: *Deleted

## 2013-06-04 MED ORDER — DEXLANSOPRAZOLE 60 MG PO CPDR: 60.0000 mg | DELAYED_RELEASE_CAPSULE | Freq: Every day | ORAL | Status: DC

## 2013-07-06 ENCOUNTER — Encounter: Payer: Self-pay | Admitting: Internal Medicine

## 2013-07-06 ENCOUNTER — Ambulatory Visit (INDEPENDENT_AMBULATORY_CARE_PROVIDER_SITE_OTHER): Payer: 59 | Admitting: *Deleted

## 2013-07-06 DIAGNOSIS — Z95810 Presence of automatic (implantable) cardiac defibrillator: Secondary | ICD-10-CM

## 2013-07-06 DIAGNOSIS — I469 Cardiac arrest, cause unspecified: Secondary | ICD-10-CM

## 2013-07-07 LAB — REMOTE ICD DEVICE
AL IMPEDENCE ICD: 456 Ohm
ATRIAL PACING ICD: 65.64 pct
BAMS-0001: 170 {beats}/min
CHARGE TIME: 8.348 s
RV LEAD AMPLITUDE: 9.6 mv
RV LEAD THRESHOLD: 0.75 V
TOT-0001: 1
TOT-0002: 0
TOT-0006: 20130722000000
TZAT-0001ATACH: 1
TZAT-0001ATACH: 3
TZAT-0001FASTVT: 1
TZAT-0002ATACH: NEGATIVE
TZAT-0002ATACH: NEGATIVE
TZAT-0002ATACH: NEGATIVE
TZAT-0002FASTVT: NEGATIVE
TZAT-0012ATACH: 150 ms
TZAT-0012ATACH: 150 ms
TZAT-0012SLOWVT: 170 ms
TZAT-0018ATACH: NEGATIVE
TZAT-0019ATACH: 6 V
TZAT-0019ATACH: 6 V
TZAT-0019ATACH: 6 V
TZAT-0019SLOWVT: 8 V
TZAT-0020ATACH: 1.5 ms
TZAT-0020FASTVT: 1.5 ms
TZAT-0020SLOWVT: 1.5 ms
TZON-0003ATACH: 350 ms
TZON-0003SLOWVT: 360 ms
TZON-0004VSLOWVT: 20
TZON-0005SLOWVT: 12
TZST-0001ATACH: 5
TZST-0001ATACH: 6
TZST-0001FASTVT: 2
TZST-0001FASTVT: 3
TZST-0001SLOWVT: 3
TZST-0001SLOWVT: 5
TZST-0001SLOWVT: 6
TZST-0002ATACH: NEGATIVE
TZST-0002ATACH: NEGATIVE
TZST-0002ATACH: NEGATIVE
TZST-0002FASTVT: NEGATIVE
TZST-0002FASTVT: NEGATIVE
TZST-0002FASTVT: NEGATIVE
TZST-0002FASTVT: NEGATIVE
TZST-0002SLOWVT: NEGATIVE
TZST-0002SLOWVT: NEGATIVE
TZST-0002SLOWVT: NEGATIVE
TZST-0002SLOWVT: NEGATIVE
VENTRICULAR PACING ICD: 0.04 pct
VF: 0

## 2013-07-09 ENCOUNTER — Other Ambulatory Visit: Payer: Self-pay

## 2013-07-23 ENCOUNTER — Other Ambulatory Visit: Payer: Self-pay | Admitting: *Deleted

## 2013-07-23 MED ORDER — FLUTICASONE PROPIONATE 50 MCG/ACT NA SUSP: 1.0000 | Freq: Every day | NASAL | Status: DC

## 2013-07-23 NOTE — Telephone Encounter (Signed)
Refill

## 2013-07-24 MED ORDER — CELECOXIB 100 MG PO CAPS: 100.0000 mg | ORAL_CAPSULE | Freq: Two times a day (BID) | ORAL | Status: DC

## 2013-08-03 DIAGNOSIS — 419620001 Death: Secondary | SNOMED CT

## 2013-08-03 DEATH — deceased

## 2013-08-12 ENCOUNTER — Encounter: Payer: Self-pay | Admitting: *Deleted

## 2013-08-14 ENCOUNTER — Other Ambulatory Visit: Payer: Self-pay | Admitting: Physician Assistant

## 2013-08-16 LAB — URINE CULTURE

## 2013-08-19 ENCOUNTER — Telehealth: Payer: Self-pay

## 2013-08-19 NOTE — Telephone Encounter (Signed)
Left Nicole Swanson voicemail this am asking her to call regarding paperwork that she faxed over this morning and asked her to call back to sched appt w/ Dr. Graciela Husbands. Nicole Swanson faxed 10 pages over from [facility] that must be completed w/in 30 days in order for her to keep her license. Advised Nicole Swanson that it may be in her best interest, to ensure that paperwork is completed in time, to sched an appt w/ Dr. Graciela Husbands to complete the paperwork.   Advised Nicole Swanson that Dr. Graciela Husbands will be in our office tomorrow in order for me to give him the paperwork but will not be back in the [place] office until 09/08/13. Nicole Swanson states that she has been a Nicole Swanson of Dr. Odessa [name] for some time and that "your office is very unorganized if you can't get your doctors to complete required paperwork." Nicole Swanson asked for my name and states that she will call the [facility] office and speak w/ Dr. Graciela Husbands to make sure that her paperwork is completed on time.

## 2013-08-31 ENCOUNTER — Ambulatory Visit: Payer: Self-pay | Admitting: Urology

## 2013-09-08 ENCOUNTER — Telehealth: Payer: Self-pay | Admitting: Internal Medicine

## 2013-09-08 NOTE — Telephone Encounter (Signed)
On 09/10/13 Between 10 and 10.30 you have a spot held can we use?

## 2013-09-08 NOTE — Telephone Encounter (Signed)
Patient notified of appointment.  

## 2013-09-08 NOTE — Telephone Encounter (Signed)
Yes you can use that slot

## 2013-09-08 NOTE — Telephone Encounter (Signed)
Left message for pt to call office see if 09/10/13 with dr Derrel Nip is ok

## 2013-09-08 NOTE — Telephone Encounter (Signed)
Pt called today to make an appointment for multiple issues and to fill out flma paperwork pt stated dr Derrel Nip aware of some the issues First 30 min appointment i could find is 09/23/13 @ 3:15.  Pt wanted to know if she could be seen sooner Please advise

## 2013-09-10 ENCOUNTER — Encounter: Payer: Self-pay | Admitting: Internal Medicine

## 2013-09-10 ENCOUNTER — Ambulatory Visit (INDEPENDENT_AMBULATORY_CARE_PROVIDER_SITE_OTHER): Payer: 59 | Admitting: Internal Medicine

## 2013-09-10 VITALS — BP 116/78 | HR 78 | Temp 97.4°F | Resp 14 | Wt 156.5 lb

## 2013-09-10 DIAGNOSIS — F329 Major depressive disorder, single episode, unspecified: Secondary | ICD-10-CM | POA: Insufficient documentation

## 2013-09-10 DIAGNOSIS — R5381 Other malaise: Secondary | ICD-10-CM | POA: Insufficient documentation

## 2013-09-10 DIAGNOSIS — K589 Irritable bowel syndrome without diarrhea: Secondary | ICD-10-CM

## 2013-09-10 DIAGNOSIS — D696 Thrombocytopenia, unspecified: Secondary | ICD-10-CM

## 2013-09-10 DIAGNOSIS — K5900 Constipation, unspecified: Secondary | ICD-10-CM

## 2013-09-10 DIAGNOSIS — R5383 Other fatigue: Principal | ICD-10-CM

## 2013-09-10 DIAGNOSIS — F411 Generalized anxiety disorder: Secondary | ICD-10-CM | POA: Insufficient documentation

## 2013-09-10 DIAGNOSIS — M5126 Other intervertebral disc displacement, lumbar region: Secondary | ICD-10-CM

## 2013-09-10 DIAGNOSIS — Z0279 Encounter for issue of other medical certificate: Secondary | ICD-10-CM

## 2013-09-10 LAB — MAGNESIUM: Magnesium: 2.3 mg/dL (ref 1.5–2.5)

## 2013-09-10 LAB — COMPREHENSIVE METABOLIC PANEL
ALBUMIN: 4.4 g/dL (ref 3.5–5.2)
ALT: 26 U/L (ref 0–35)
AST: 28 U/L (ref 0–37)
Alkaline Phosphatase: 71 U/L (ref 39–117)
BUN: 16 mg/dL (ref 6–23)
CALCIUM: 9.8 mg/dL (ref 8.4–10.5)
CHLORIDE: 106 meq/L (ref 96–112)
CO2: 27 mEq/L (ref 19–32)
Creatinine, Ser: 0.8 mg/dL (ref 0.4–1.2)
GFR: 77.84 mL/min (ref >60.00)
GLUCOSE: 99 mg/dL (ref 70–99)
POTASSIUM: 4.7 meq/L (ref 3.5–5.1)
Sodium: 140 mEq/L (ref 135–145)
Total Bilirubin: 0.8 mg/dL (ref 0.3–1.2)
Total Protein: 7.7 g/dL (ref 6.0–8.3)

## 2013-09-10 LAB — CBC WITH DIFFERENTIAL/PLATELET
BASOS PCT: 0.4 % (ref 0.0–3.0)
Basophils Absolute: 0 10*3/uL (ref 0.0–0.1)
EOS PCT: 3.4 % (ref 0.0–5.0)
Eosinophils Absolute: 0.1 10*3/uL (ref 0.0–0.7)
HCT: 38.6 % (ref 36.0–46.0)
HEMOGLOBIN: 12.9 g/dL (ref 12.0–15.0)
Lymphocytes Relative: 37.9 % (ref 12.0–46.0)
Lymphs Abs: 1.5 10*3/uL (ref 0.7–4.0)
MCHC: 33.4 g/dL (ref 30.0–36.0)
MCV: 85.9 fl (ref 78.0–100.0)
MONOS PCT: 6.1 % (ref 3.0–12.0)
Monocytes Absolute: 0.2 10*3/uL (ref 0.1–1.0)
NEUTROS ABS: 2.1 10*3/uL (ref 1.4–7.7)
NEUTROS PCT: 52.2 % (ref 43.0–77.0)
Platelets: 114 10*3/uL — ABNORMAL LOW (ref 150.0–400.0)
RBC: 4.49 Mil/uL (ref 3.87–5.11)
RDW: 14.2 % (ref 11.5–14.6)
WBC: 4 10*3/uL — AB (ref 4.5–10.5)

## 2013-09-10 LAB — TSH: TSH: 1.13 u[IU]/mL (ref 0.35–5.50)

## 2013-09-10 MED ORDER — ALPRAZOLAM 0.5 MG PO TABS: 0.5000 mg | ORAL_TABLET | Freq: Every evening | ORAL | Status: DC | PRN

## 2013-09-10 MED ORDER — MIRTAZAPINE 7.5 MG PO TABS: 7.5000 mg | ORAL_TABLET | Freq: Every day | ORAL | Status: DC

## 2013-09-10 NOTE — Assessment & Plan Note (Signed)
Aggravated by prolonged sitting and standing, which in turn aggravates plantar foot pain.

## 2013-09-10 NOTE — Assessment & Plan Note (Signed)
Trial of alprazolam for management of acute anxiety

## 2013-09-10 NOTE — Patient Instructions (Signed)
I will fill out the FMLA form tonight and submit it tomorrow  Trial of Amitiza (one tablet twice daily ) or Linzess ,  One tablet daily   For the constipation  Trial of generic remeron for the depression/low energy,  You can increase dose to 15 mg after one week   Trial of alprazolam for anxiety/panic.  Use as needed (it is a benzodiazepine so it can be mildly sedating at higher doses)

## 2013-09-10 NOTE — Assessment & Plan Note (Addendum)
constaiption predominant . Now with abdominal pain and obstipation.  Trial ofmedications with samples of Linzess 145 mcg daily and Amitiza 8 mcg bid given .  Up to date on colonoscopy.  Denies hemaotchezia.

## 2013-09-10 NOTE — Progress Notes (Addendum)
Patient ID: Nicole Swanson, female   DOB: 04-Jun-1956, 58 y.o.   MRN: [id number]  Patient Active Problem List   Diagnosis Date Noted  . Other malaise and fatigue 09/10/2013  . Major depressive disorder, single episode 09/10/2013  . Anxiety state, unspecified 09/10/2013  . Raynaud phenomenon 04/17/2013  . Sinus congestion 04/17/2013  . Low back pain 12/05/2012  . Shortness of breath 06/15/2012  . Aborted cardiac arrest   . ICD-Medtronic   . PVC's / ventricular tachycardia   . 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 Irritable bowel syndrome     Subjective:  CC:   Chief Complaint  Patient presents with  . Follow-up    scripts and fmla paper work    HPI:   Nicole Simerly Priestis a 58 y.o. female who presents with severe depression and anxiety, and aggravation of other more chronic conditions. She has a remote history of depression,  Occurred when her father died in 79,  Was treated with zoloft  For several months before symptoms resolved.  Current episode has been triggered by her husband's losing battle with lung Cancer.  Sympotms have been present since early December.   She is not sleeping well at all.  Many nights she has trouble turnign her mind off.  appetite poor,  no energy.  Has been neglecting self. Has tried to go to work but can't  Concentrate, gets easily distracted from task and is having difficulty remembering to finish task.  Her depression is complicated by worsening low back pain and new onset plantar fasciitis.  Prolonged sitting aggravates both the pain in her lower back and creates a trigger for severe foot pain when she stands  Up.  She is generally miserable. And requesting FMLA so she can address her multiple health problems.      Her husband Tom's metastatic lung CA has spread everywhere and his prognosis is 2 months unless a new experimental drug for melanoma proves to be helpful.  They are awaiting its FA=DA approval in the next few weeks. He has had to  have a pleurex catheter placed for management of recurrent  hemothorax and  she is having to drain it every 2 days,    Her arthritis is flaring.  Her back pain pain and IBS are flaring,  She is exhausted.  She has had intermittent FMLA to take Tom to his appts but needs continuous FMLA to care for herself.  The bilateral foot pain, plantar surface has been present for a few wee.s  Too painful too walk for 15 minutes or so, then starts to ease up  Sciatic nerve is flaring up several times per year,  Aggravated by sitting.   IBS:  DAILY ABDOMINAL PAIN, constipation,  Gas and nausea which is mild and intermittent,  No vomiting.  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  She can't concentrate or stay awake due to fatigue,  Having trouble multitasking a,  When she gets distracted she cannot return to the task and finish it. The abdominal pain and back pain is distracting and aggravated by same position at desk ,  Had 3 UTIs in 2 months  treated at [facility], followed by Urology [facility],  Who resumed Estrace cream and theracran tablets,    Past Medical History  Diagnosis Date  . Aborted cardiac arrest 1994  . DVT (deep vein thrombosis) in pregnancy 1986    had heparin shots followed by coumadin  . Dual implantable cardiac defibrillator     Medtronic initially implanted in 1997  . 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 Irritable bowel syndrome   . PVC's (premature ventricular contractions)   . Ventricular tachycardia   . Family history of seizures     Question neurological versus cardiac    Past Surgical History  Procedure Laterality Date  . Cardiac defibrillator placement  1994    Dual chamber with  pacemaker  . Nasal septum surgery  2006    [name]        The following portions of the patient's history were reviewed and updated as appropriate: Allergies, current medications, and problem list.    Review of Systems:   12 Pt  review of systems was negative except those addressed in the HPI,     History   Social History  . Marital Status: Married    Spouse Name: N/A    Number of Children: N/A  . Years of Education: N/A   Occupational History  . Not on file.   Social History Main Topics  . Smoking status: Former Smoker    Types: Cigarettes    Quit date: 06/04/1995  . Smokeless tobacco: Never Used  . Alcohol Use: Yes     Comment: occasional  . Drug Use: No  . Sexual Activity: Not on file   Other Topics Concern  . Not on file   Social History Narrative  . No narrative on file    Objective:  Filed Vitals:   09/10/13 1008  BP: 116/78  Pulse: 78  Temp: 97.4 F (36.3 C)  Resp: 14     General appearance: alert, cooperative and appears stated age Ears: normal TM's and external ear canals both ears Throat: lips, mucosa, and tongue normal; teeth and gums normal Neck: no adenopathy, no carotid bruit, supple, symmetrical, trachea midline and thyroid not enlarged, symmetric, no tenderness/mass/nodules Back: symmetric, no curvature. ROM normal. No CVA tenderness. Lungs: clear to auscultation bilaterally Heart: regular rate and rhythm, S1, S2 normal, no murmur, click, rub or gallop Abdomen: soft, non-tender; bowel sounds normal; no masses,  no organomegaly Pulses: 2+ and symmetric Skin: Skin color, texture, turgor normal. No rashes or lesions Lymph nodes: Cervical, supraclavicular, and axillary nodes normal.  Assessment and Plan: Major depressive disorder, single episode Discussed trial of remeron given multiple negative symptoms including insomnia and anhedonia. Start with 1/2 talbet,  7.5 mg and titrate up.  Return in 2 weeks .  Given her multiple  medical conditioms currently uncontrolled, I have advised her to take 6 months off from work with [employer] and have completed the forms.    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   Anxiety state, unspecified Trial of alprazolam for management of acute anxiety   Irritable bowel syndrome constaiption predominant . Now with abdominal pain and obstipation.  Trial ofmedications with samples of Linzess 145 mcg daily and Amitiza 8 mcg bid given .  Up to date on colonoscopy.  Denies hemaotchezia.   Thrombocytopenia, unspecified repeat CBC w LDH in 2 to 4 weeks    Updated Medication List  Outpatient Encounter Prescriptions as of 09/10/2013  Medication Sig  . aspirin 81 MG tablet Take 81 mg by mouth daily.    . celecoxib (CELEBREX) 100 MG capsule Take 1 capsule (100 mg total) by mouth 2 (two) times daily.  . clidinium-chlordiazePOXIDE (LIBRAX) 2.5-5 MG per capsule Take 1 capsule by mouth 4 (four) times daily -  before meals and at bedtime. As needed.  Marland Kitchen dexlansoprazole (DEXILANT) 60 MG capsule Take 1 capsule (60 mg total) by mouth daily.  Marland Kitchen docusate sodium (COLACE) 250 MG capsule Take 250 mg by mouth 2 (two) times daily.  Marland Kitchen estradiol (ESTRACE VAGINAL) 0.1 MG/GM vaginal cream Place 1 Applicatorful vaginally at bedtime.  . fluticasone (FLONASE) 50 MCG/ACT nasal spray Place 1 spray into both nostrils daily.  . montelukast (SINGULAIR) 10 MG tablet Take 1 tablet (10 mg total) by mouth at bedtime.  . nabumetone (RELAFEN) 500 MG tablet Take 1 tablet (500 mg total) by mouth 2 (two) times daily.  . sotalol (BETAPACE) 120 MG tablet Take 1 tablet (120 mg total) by mouth 2 (two) times daily.  Marland Kitchen albuterol (PROVENTIL HFA;VENTOLIN HFA) 108 (90 BASE) MCG/ACT inhaler Inhale 2 puffs into the lungs every 6 (six) hours as needed.  . ALPRAZolam (XANAX) 0.5 MG tablet Take 1 tablet (0.5 mg total) by mouth at bedtime as needed  for anxiety.  . budesonide-formoterol (SYMBICORT) 80-4.5 MCG/ACT inhaler Inhale 2 puffs into the lungs 2 (two) times daily.  Marland Kitchen estrogen-methylTESTOSTERone (ESTRATEST) 1.25-2.5 MG per tablet Take 1 tablet by mouth 2 (two) times a week. Tuesday and Friday  . fexofenadine (ALLEGRA) 180 MG tablet Take 1 tablet (180 mg total) by mouth daily.  . mirtazapine (REMERON) 7.5 MG tablet Take 1 tablet (7.5 mg total) by mouth at bedtime.  . [DISCONTINUED] azithromycin (ZITHROMAX) 500 MG tablet Take 1 tablet (500 mg total) by mouth daily.

## 2013-09-10 NOTE — Assessment & Plan Note (Addendum)
Discussed trial of remeron given multiple negative symptoms including insomnia and anhedonia. Start with 1/2 talbet,  7.5 mg and titrate up.  Return in 2 weeks .  Given her multiple medical conditioms currently uncontrolled, I have advised her to take 6 months off from work with [employer] and have completed the forms.

## 2013-09-11 ENCOUNTER — Encounter: Payer: Self-pay | Admitting: *Deleted

## 2013-09-11 DIAGNOSIS — D696 Thrombocytopenia, unspecified: Secondary | ICD-10-CM | POA: Insufficient documentation

## 2013-09-11 NOTE — Assessment & Plan Note (Signed)
repeat CBC w LDH in 2 to 4 weeks

## 2013-09-11 NOTE — Addendum Note (Signed)
Addended by: [name] on: 09/11/2013 01:25 PM   Modules accepted: Orders

## 2013-09-11 NOTE — Progress Notes (Signed)
Called patient verified how she would like FMLA paper sent patient ask for them to be faxed to number on form [phone], faxed as requested and mailed original to patient with a copy scan to chart. Will have copy in case fax failure.

## 2013-09-11 NOTE — Progress Notes (Signed)
   Subjective:    Patient ID: Nicole Swanson, female    DOB: [birth year], 58 y.o.   MRN: [id number]  HPI    Review of Systems     Objective:   Physical Exam        Assessment & Plan:

## 2013-09-14 ENCOUNTER — Encounter: Payer: Self-pay | Admitting: *Deleted

## 2013-09-16 ENCOUNTER — Telehealth: Payer: Self-pay | Admitting: *Deleted

## 2013-09-16 NOTE — Telephone Encounter (Signed)
Insurance company is to fax over new paper work will not except date changes will forward as soon as received .

## 2013-09-18 ENCOUNTER — Telehealth: Payer: Self-pay | Admitting: Internal Medicine

## 2013-09-18 NOTE — Telephone Encounter (Signed)
[name],  I need the original FMLA form that you saved from scanning.

## 2013-09-18 NOTE — Telephone Encounter (Signed)
Gave form to you let me know when will fax for patient .

## 2013-09-23 ENCOUNTER — Ambulatory Visit: Payer: 59 | Admitting: Internal Medicine

## 2013-09-29 ENCOUNTER — Telehealth: Payer: Self-pay | Admitting: Internal Medicine

## 2013-09-29 NOTE — Telephone Encounter (Signed)
Pt wanted to know if Dr Derrel Nip has seen an Attending Physicians Statement from [employer] life.  She faxed over on 09/17/13 and needs the form faxed back to [phone]

## 2013-09-29 NOTE — Telephone Encounter (Signed)
Pap smears are recommended every 3 years until age 60 in women  who have had recurrently normal Pap smears. In women whose Pap smears have been abnormal annual is recommended. Please set patient up according to guidelines.

## 2013-09-29 NOTE — Telephone Encounter (Signed)
Yes I have filled it out and sent it back

## 2013-09-29 NOTE — Telephone Encounter (Signed)
See below my chart message  Appointment Request From: Nicole Swanson      With Provider: Deborra Medina, MD [-Primary Care Physician-]      Preferred Date Range: From 09/25/2013 To 10/30/2013      Preferred Times: Monday Afternoon, Tuesday Afternoon, Wednesday Afternoon, Thursday Afternoon, Friday Afternoon      Reason for visit: Office Visit      Comments:   My last pelvic exam was done at [facility] by Dr. [name] on 07/17/2011. Up until then I had a pelvic exam on an annual basis. I'm not sure what the current recommendations are now for pelvic exams for my age (I will be 40 in April). If it is recommended that I have one periodically, and it has now exceeded that time frame now (since 07/17/2011) then please schedule an appointment for me.

## 2013-10-02 NOTE — Telephone Encounter (Signed)
Spoke to pt told her Dr. Derrel Nip said she filled out the form and sent it back. Pt verbalized understanding.

## 2013-10-07 ENCOUNTER — Ambulatory Visit (INDEPENDENT_AMBULATORY_CARE_PROVIDER_SITE_OTHER): Payer: 59 | Admitting: *Deleted

## 2013-10-07 DIAGNOSIS — I469 Cardiac arrest, cause unspecified: Secondary | ICD-10-CM

## 2013-10-09 ENCOUNTER — Ambulatory Visit: Payer: 59 | Admitting: Podiatry

## 2013-10-09 ENCOUNTER — Ambulatory Visit (INDEPENDENT_AMBULATORY_CARE_PROVIDER_SITE_OTHER): Payer: 59

## 2013-10-09 ENCOUNTER — Encounter: Payer: Self-pay | Admitting: Podiatry

## 2013-10-09 VITALS — BP 119/71 | HR 78 | Resp 16 | Ht 67.0 in | Wt 154.0 lb

## 2013-10-09 DIAGNOSIS — M722 Plantar fascial fibromatosis: Secondary | ICD-10-CM

## 2013-10-09 MED ORDER — TRIAMCINOLONE ACETONIDE 10 MG/ML IJ SUSP
10.0000 mg | Freq: Once | INTRAMUSCULAR | Status: AC
  Administered 2013-10-09: 10 mg

## 2013-10-09 NOTE — Progress Notes (Signed)
   Subjective:    Patient ID: Nicole Swanson, female    DOB: [birth year], 58 y.o.   MRN: [id number]  HPI Comments: Problem with both feet. The left one is the worse. When i get up in the mornings i can hardly put weight one it , sharp pain, it aches . Its been about 4 weeks   Foot Pain      Review of Systems  Respiratory:       Palpitations  Ventricular arrythmia   Endocrine: Positive for cold intolerance.       Raynauds   Musculoskeletal: Positive for back pain.       Joint pain  Muscle pain  Difficulty walking   Allergic/Immunologic: Positive for environmental allergies.  Psychiatric/Behavioral:       Depression  All other systems reviewed and are negative.       Objective:   Physical Exam        Assessment & Plan:

## 2013-10-09 NOTE — Patient Instructions (Signed)
Plantar Fasciitis (Heel Spur Syndrome)  with Rehab  The plantar fascia is a fibrous, ligament-like, soft-tissue structure that spans the bottom of the foot. Plantar fasciitis is a condition that causes pain in the foot due to inflammation of the tissue.  SYMPTOMS   · Pain and tenderness on the underneath side of the foot.  · Pain that worsens with standing or walking.  CAUSES   Plantar fasciitis is caused by irritation and injury to the plantar fascia on the underneath side of the foot. Common mechanisms of injury include:  · Direct trauma to bottom of the foot.  · Damage to a small nerve that runs under the foot where the main fascia attaches to the heel bone.  · Stress placed on the plantar fascia due to bone spurs.  RISK INCREASES WITH:   · Activities that place stress on the plantar fascia (running, jumping, pivoting, or cutting).  · Poor strength and flexibility.  · Improperly fitted shoes.  · Tight calf muscles.  · Flat feet.  · Failure to warm-up properly before activity.  · Obesity.  PREVENTION  · Warm up and stretch properly before activity.  · Allow for adequate recovery between workouts.  · Maintain physical fitness:  · Strength, flexibility, and endurance.  · Cardiovascular fitness.  · Maintain a health body weight.  · Avoid stress on the plantar fascia.  · Wear properly fitted shoes, including arch supports for individuals who have flat feet.  PROGNOSIS   If treated properly, then the symptoms of plantar fasciitis usually resolve without surgery. However, occasionally surgery is necessary.  RELATED COMPLICATIONS   · Recurrent symptoms that may result in a chronic condition.  · Problems of the lower back that are caused by compensating for the injury, such as limping.  · Pain or weakness of the foot during push-off following surgery.  · Chronic inflammation, scarring, and partial or complete fascia tear, occurring more often from repeated injections.  TREATMENT   Treatment initially involves the use of  ice and medication to help reduce pain and inflammation. The use of strengthening and stretching exercises may help reduce pain with activity, especially stretches of the Achilles tendon. These exercises may be performed at home or with a therapist. Your caregiver may recommend that you use heel cups of arch supports to help reduce stress on the plantar fascia. Occasionally, corticosteroid injections are given to reduce inflammation. If symptoms persist for greater than 6 months despite non-surgical (conservative), then surgery may be recommended.   MEDICATION   · If pain medication is necessary, then nonsteroidal anti-inflammatory medications, such as aspirin and ibuprofen, or other minor pain relievers, such as acetaminophen, are often recommended.  · Do not take pain medication within 7 days before surgery.  · Prescription pain relievers may be given if deemed necessary by your caregiver. Use only as directed and only as much as you need.  · Corticosteroid injections may be given by your caregiver. These injections should be reserved for the most serious cases, because they may only be given a certain number of times.  HEAT AND COLD  · Cold treatment (icing) relieves pain and reduces inflammation. Cold treatment should be applied for 10 to 15 minutes every 2 to 3 hours for inflammation and pain and immediately after any activity that aggravates your symptoms. Use ice packs or massage the area with a piece of ice (ice massage).  · Heat treatment may be used prior to performing the stretching and strengthening activities prescribed   by your caregiver, physical therapist, or athletic trainer. Use a heat pack or soak the injury in warm water.  SEEK IMMEDIATE MEDICAL CARE IF:  · Treatment seems to offer no benefit, or the condition worsens.  · Any medications produce adverse side effects.  EXERCISES  RANGE OF MOTION (ROM) AND STRETCHING EXERCISES - Plantar Fasciitis (Heel Spur Syndrome)  These exercises may help you  when beginning to rehabilitate your injury. Your symptoms may resolve with or without further involvement from your physician, physical therapist or athletic trainer. While completing these exercises, remember:   · Restoring tissue flexibility helps normal motion to return to the joints. This allows healthier, less painful movement and activity.  · An effective stretch should be held for at least 30 seconds.  · A stretch should never be painful. You should only feel a gentle lengthening or release in the stretched tissue.  RANGE OF MOTION - Toe Extension, Flexion  · Sit with your right / left leg crossed over your opposite knee.  · Grasp your toes and gently pull them back toward the top of your foot. You should feel a stretch on the bottom of your toes and/or foot.  · Hold this stretch for __________ seconds.  · Now, gently pull your toes toward the bottom of your foot. You should feel a stretch on the top of your toes and or foot.  · Hold this stretch for __________ seconds.  Repeat __________ times. Complete this stretch __________ times per day.   RANGE OF MOTION - Ankle Dorsiflexion, Active Assisted  · Remove shoes and sit on a chair that is preferably not on a carpeted surface.  · Place right / left foot under knee. Extend your opposite leg for support.  · Keeping your heel down, slide your right / left foot back toward the chair until you feel a stretch at your ankle or calf. If you do not feel a stretch, slide your bottom forward to the edge of the chair, while still keeping your heel down.  · Hold this stretch for __________ seconds.  Repeat __________ times. Complete this stretch __________ times per day.   STRETCH  Gastroc, Standing  · Place hands on wall.  · Extend right / left leg, keeping the front knee somewhat bent.  · Slightly point your toes inward on your back foot.  · Keeping your right / left heel on the floor and your knee straight, shift your weight toward the wall, not allowing your back to  arch.  · You should feel a gentle stretch in the right / left calf. Hold this position for __________ seconds.  Repeat __________ times. Complete this stretch __________ times per day.  STRETCH  Soleus, Standing  · Place hands on wall.  · Extend right / left leg, keeping the other knee somewhat bent.  · Slightly point your toes inward on your back foot.  · Keep your right / left heel on the floor, bend your back knee, and slightly shift your weight over the back leg so that you feel a gentle stretch deep in your back calf.  · Hold this position for __________ seconds.  Repeat __________ times. Complete this stretch __________ times per day.  STRETCH  Gastrocsoleus, Standing   Note: This exercise can place a lot of stress on your foot and ankle. Please complete this exercise only if specifically instructed by your caregiver.   · Place the ball of your right / left foot on a step, keeping   your other foot firmly on the same step.  · Hold on to the wall or a rail for balance.  · Slowly lift your other foot, allowing your body weight to press your heel down over the edge of the step.  · You should feel a stretch in your right / left calf.  · Hold this position for __________ seconds.  · Repeat this exercise with a slight bend in your right / left knee.  Repeat __________ times. Complete this stretch __________ times per day.   STRENGTHENING EXERCISES - Plantar Fasciitis (Heel Spur Syndrome)   These exercises may help you when beginning to rehabilitate your injury. They may resolve your symptoms with or without further involvement from your physician, physical therapist or athletic trainer. While completing these exercises, remember:   · Muscles can gain both the endurance and the strength needed for everyday activities through controlled exercises.  · Complete these exercises as instructed by your physician, physical therapist or athletic trainer. Progress the resistance and repetitions only as guided.  STRENGTH - Towel  Curls  · Sit in a chair positioned on a non-carpeted surface.  · Place your foot on a towel, keeping your heel on the floor.  · Pull the towel toward your heel by only curling your toes. Keep your heel on the floor.  · If instructed by your physician, physical therapist or athletic trainer, add ____________________ at the end of the towel.  Repeat __________ times. Complete this exercise __________ times per day.  STRENGTH - Ankle Inversion  · Secure one end of a rubber exercise band/tubing to a fixed object (table, pole). Loop the other end around your foot just before your toes.  · Place your fists between your knees. This will focus your strengthening at your ankle.  · Slowly, pull your big toe up and in, making sure the band/tubing is positioned to resist the entire motion.  · Hold this position for __________ seconds.  · Have your muscles resist the band/tubing as it slowly pulls your foot back to the starting position.  Repeat __________ times. Complete this exercises __________ times per day.   Document Released: 08/20/2005 Document Revised: 11/12/2011 Document Reviewed: 12/02/2008  [employer]® Patient Information ©2014 [employer].

## 2013-10-09 NOTE — Progress Notes (Signed)
Subjective:     Patient ID: Nicole Swanson, female   DOB: July 30, 1956, 58 y.o.   MRN: [id number]  Foot Pain   patient states I have pain in both my heels with my left hurting more than my right and intensive the that has gotten worse over the last month. Does not remember specific injury that may have occurred  Review of Systems  All other systems reviewed and are negative.       Objective:   Physical Exam  Nursing note and vitals reviewed. Constitutional: She is oriented to person, place, and time.  Cardiovascular: Intact distal pulses.   Musculoskeletal: Normal range of motion.  Neurological: She is oriented to person, place, and time.  Skin: Skin is warm.   neurovascular status intact with muscle strength adequate and no equinus condition noted of both feet. Discomfort of intense nature left plantar fascia at the insertion and moderate discomfort on the right plantar fascia at the insertion    Assessment:     Severe plantar fasciitis left heel moderate in the right heel    Plan:     H&P and x-rays reviewed. Injected the left plantar fascia 3 mg Kenalog 5 of Xylocaine Marcaine mixture and dispensed fascially brace with instructions on usage. Continue taking Celebrex

## 2013-10-13 LAB — MDC_IDC_ENUM_SESS_TYPE_REMOTE
Brady Statistic AP VS Percent: 67.9 %
Lead Channel Impedance Value: 437 Ohm
Lead Channel Pacing Threshold Amplitude: 0.5 V
Lead Channel Pacing Threshold Amplitude: 0.875 V
Lead Channel Pacing Threshold Pulse Width: 0.4 ms
Lead Channel Pacing Threshold Pulse Width: 0.4 ms
Lead Channel Sensing Intrinsic Amplitude: 2.4 mV
Lead Channel Sensing Intrinsic Amplitude: 9.8 mV
MDC IDC MSMT LEADCHNL RA IMPEDANCE VALUE: 456 Ohm
MDC IDC STAT BRADY AP VP PERCENT: 0.1 %
MDC IDC STAT BRADY AS VP PERCENT: 0.1 %
MDC IDC STAT BRADY AS VS PERCENT: 32 %

## 2013-10-16 ENCOUNTER — Ambulatory Visit (INDEPENDENT_AMBULATORY_CARE_PROVIDER_SITE_OTHER): Payer: 59 | Admitting: Podiatry

## 2013-10-16 ENCOUNTER — Encounter: Payer: Self-pay | Admitting: Podiatry

## 2013-10-16 VITALS — BP 105/67 | HR 76 | Resp 16

## 2013-10-16 DIAGNOSIS — M722 Plantar fascial fibromatosis: Secondary | ICD-10-CM

## 2013-10-16 MED ORDER — TRIAMCINOLONE ACETONIDE 10 MG/ML IJ SUSP
10.0000 mg | Freq: Once | INTRAMUSCULAR | Status: AC
  Administered 2013-10-16: 10 mg

## 2013-10-16 NOTE — Progress Notes (Signed)
Left heel maybe a little bit better

## 2013-10-16 NOTE — Progress Notes (Signed)
Subjective:     Patient ID: Nicole Swanson, female   DOB: 1955/09/12, 59 y.o.   MRN: [id number]  HPI patient points to left heel stating that still has been awful she's not been able to get off her foot. Her husband has terminal cancer and she needs to be on her feet at all times   Review of Systems     Objective:   Physical Exam Neurovascular status intact with no changes in health history and exquisite discomfort still noted medial and central band of the left plantar fascial    Assessment:     Plantar fasciitis left inflammation and fluid around the medial band still noted    Plan:     Today we discussed the she is a very narrow healed but this is difficult to get better and I went ahead and I discussed continued conservative care consisting of night splint long-term orthotics for which she was skanned today and I did carefully inject the plantar heel again 3 mg Kenalog 5 mg Xylocaine Marcaine mixture. Patient will be seen back when orthotics return

## 2013-10-28 ENCOUNTER — Encounter: Payer: Self-pay | Admitting: *Deleted

## 2013-10-30 ENCOUNTER — Encounter: Payer: Self-pay | Admitting: *Deleted

## 2013-10-30 NOTE — Progress Notes (Signed)
ORTHOTICS RECEIVED TODAY , POSTCARD SENT TO NOTIFY PATIENT

## 2013-11-01 DIAGNOSIS — 419620001 Death: Secondary | SNOMED CT

## 2013-11-01 DEATH — deceased

## 2013-11-06 ENCOUNTER — Ambulatory Visit (INDEPENDENT_AMBULATORY_CARE_PROVIDER_SITE_OTHER): Payer: 59 | Admitting: Internal Medicine

## 2013-11-06 ENCOUNTER — Encounter: Payer: Self-pay | Admitting: Internal Medicine

## 2013-11-06 ENCOUNTER — Ambulatory Visit (INDEPENDENT_AMBULATORY_CARE_PROVIDER_SITE_OTHER): Payer: 59 | Admitting: Podiatry

## 2013-11-06 ENCOUNTER — Encounter: Payer: Self-pay | Admitting: Podiatry

## 2013-11-06 VITALS — BP 102/60 | HR 86 | Temp 97.4°F | Resp 16 | Wt 152.2 lb

## 2013-11-06 VITALS — BP 119/71 | HR 78 | Resp 16

## 2013-11-06 DIAGNOSIS — F4321 Adjustment disorder with depressed mood: Secondary | ICD-10-CM

## 2013-11-06 DIAGNOSIS — F329 Major depressive disorder, single episode, unspecified: Secondary | ICD-10-CM

## 2013-11-06 DIAGNOSIS — M722 Plantar fascial fibromatosis: Secondary | ICD-10-CM

## 2013-11-06 MED ORDER — ZOSTER VACCINE LIVE 19400 UNT/0.65ML ~~LOC~~ SOLR: 0.6500 mL | Freq: Once | SUBCUTANEOUS | Status: DC

## 2013-11-06 MED ORDER — TETANUS-DIPHTH-ACELL PERTUSSIS 5-2.5-18.5 LF-MCG/0.5 IM SUSP: 0.5000 mL | Freq: Once | INTRAMUSCULAR | Status: DC

## 2013-11-06 NOTE — Patient Instructions (Signed)

## 2013-11-06 NOTE — Progress Notes (Addendum)
Patient ID: Nicole Swanson, female   DOB: 1956-04-30, 58 y.o.   MRN: [id number]  Patient Active Problem List   Diagnosis Date Noted  . Grief reaction 11/08/2013  . Thrombocytopenia, unspecified 09/11/2013  . Other malaise and fatigue 09/10/2013  . Major depressive disorder, single episode 09/10/2013  . Anxiety state, unspecified 09/10/2013  . Raynaud phenomenon 04/17/2013  . Sinus congestion 04/17/2013  . Low back pain 12/05/2012  . Shortness of breath 06/08/2012  . Aborted cardiac arrest   . ICD-Medtronic   . PVC's / ventricular tachycardia   . 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 Irritable bowel syndrome     Subjective:  CC:   Chief Complaint  Patient presents with  . Follow-up    wants to return to work    HPI:   Nicole Swanson is a 58 y.o. female who presents for follow up on depression and grief following the death of her beloved husband Marcello Moores.   Past Medical History  Diagnosis Date  . Aborted cardiac arrest 1994  . DVT (deep vein thrombosis) in pregnancy 1986    had heparin shots followed by coumadin  . Dual implantable cardiac defibrillator     Medtronic initially implanted in 1997  . 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 Irritable bowel syndrome   . PVC's (premature ventricular contractions)   . Ventricular tachycardia   . Family history of seizures     Question neurological versus cardiac    Past Surgical History  Procedure Laterality Date  . Cardiac defibrillator placement  1994    Dual chamber with pacemaker  . Nasal septum surgery  2006    [name]        The following portions of the patient's history were reviewed and updated as appropriate: Allergies, current medications, and problem list.    Review of Systems:   Patient denies headache, fevers, malaise, unintentional weight loss, skin rash, eye pain, sinus congestion and sinus pain, sore throat, dysphagia,  hemoptysis , cough, dyspnea, wheezing,  chest pain, palpitations, orthopnea, edema, abdominal pain, nausea, melena, diarrhea, constipation, flank pain, dysuria, hematuria, urinary  Frequency, nocturia, numbness, tingling, seizures,  Focal weakness, Loss of consciousness,  Tremor, insomnia, depression, anxiety, and suicidal ideation.     History   Social History  . Marital Status: Married    Spouse Name: N/A    Number of Children: N/A  . Years of Education: N/A   Occupational History  . Not on file.   Social History Main Topics  . Smoking status: Former Smoker    Types: Cigarettes    Quit date: 06/04/1995  . Smokeless tobacco: Never Used  . Alcohol Use: Yes     Comment: occasional  . Drug Use: No  . Sexual Activity: Not on file   Other Topics Concern  . Not on file   Social History Narrative  . No narrative on file    Objective:  Filed Vitals:   11/06/13 1547  BP: 102/60  Pulse: 86  Temp: 97.4 F (36.3 C)  Resp: 16     General appearance: alert, cooperative and appears stated age Ears: normal TM's and external ear canals both ears Throat: lips, mucosa, and tongue normal; teeth and gums normal Neck: no adenopathy, no carotid bruit, supple, symmetrical, trachea midline and thyroid not enlarged, symmetric, no tenderness/mass/nodules Back: symmetric, no curvature. ROM normal. No CVA tenderness. Lungs: clear to auscultation bilaterally Heart: regular rate and rhythm, S1, S2  normal, no murmur, click, rub or gallop Abdomen: soft, non-tender; bowel sounds normal; no masses,  no organomegaly Pulses: 2+ and symmetric Skin: Skin color, texture, turgor normal. No rashes or lesions Lymph nodes: Cervical, supraclavicular, and axillary nodes normal. Psych:  Sad, affect appropriate,  Makes good eye contact.   Assessment and Plan:  Major depressive disorder, single episode Complicated by anxiety and grief ,  Now improving. Did not start the remeron and does not feel she needs it currently.   Grief  reaction Patient is handling the death of her husband quite well, is emtionally supported by family and friends.  Discussed returning to work per her request. Cleared to return.   A total of 25 minutes was spent with patient more than half of which was spent in counseling, reviewing records from other prviders and coordination of care.  Updated Medication List Outpatient Encounter Prescriptions as of 11/06/2013  Medication Sig  . ALPRAZolam (XANAX) 0.5 MG tablet Take 1 tablet (0.5 mg total) by mouth at bedtime as needed for anxiety.  Marland Kitchen aspirin 81 MG tablet Take 81 mg by mouth daily.    . celecoxib (CELEBREX) 100 MG capsule Take 1 capsule (100 mg total) by mouth 2 (two) times daily.  . clidinium-chlordiazePOXIDE (LIBRAX) 2.5-5 MG per capsule Take 1 capsule by mouth 4 (four) times daily -  before meals and at bedtime. As needed.  Marland Kitchen dexlansoprazole (DEXILANT) 60 MG capsule Take 1 capsule (60 mg total) by mouth daily.  Marland Kitchen docusate sodium (COLACE) 250 MG capsule Take 250 mg by mouth 2 (two) times daily.  Marland Kitchen estradiol (ESTRACE VAGINAL) 0.1 MG/GM vaginal cream Place 1 Applicatorful vaginally at bedtime.  . fexofenadine (ALLEGRA) 180 MG tablet Take 1 tablet (180 mg total) by mouth daily.  . fluticasone (FLONASE) 50 MCG/ACT nasal spray Place 1 spray into both nostrils daily.  . mirtazapine (REMERON) 7.5 MG tablet Take 1 tablet (7.5 mg total) by mouth at bedtime.  . montelukast (SINGULAIR) 10 MG tablet Take 1 tablet (10 mg total) by mouth at bedtime.  . nabumetone (RELAFEN) 500 MG tablet Take 1 tablet (500 mg total) by mouth 2 (two) times daily.  . sotalol (BETAPACE) 120 MG tablet Take 1 tablet (120 mg total) by mouth 2 (two) times daily.  . Tdap (BOOSTRIX) 5-2.5-18.5 LF-MCG/0.5 injection Inject 0.5 mLs into the muscle once.  . zoster vaccine live, PF, (ZOSTAVAX) 48250 UNT/0.65ML injection Inject 19,400 Units into the skin once.     No orders of the defined types were placed in this encounter.    No  Follow-up on file.

## 2013-11-06 NOTE — Progress Notes (Signed)
Subjective:     Patient ID: Nicole Swanson, female   DOB: 03-31-1956, 58 y.o.   MRN: [id number]  HPI patient presents stating that my left heel is improving but still sore   Review of Systems     Objective:   Physical Exam Neurovascular status intact   with pain in the plantar heel left that is improved from previous visit Assessment:     Plantar fasciitis left it does show some improvement    Plan:     Dispensed orthotics discussed continued night splint usage anti-inflammatories and physical therapy. Reappoint to recheck again in 6 weeks

## 2013-11-06 NOTE — Progress Notes (Signed)
Pick up orthotic verbal and written instructions given

## 2013-11-08 DIAGNOSIS — F4321 Adjustment disorder with depressed mood: Secondary | ICD-10-CM | POA: Insufficient documentation

## 2013-11-08 DIAGNOSIS — F4320 Adjustment disorder, unspecified: Secondary | ICD-10-CM | POA: Insufficient documentation

## 2013-11-08 NOTE — Assessment & Plan Note (Signed)
Patient is handling the death of her husband quite well, is emtionally supported by family and friends.  Discussed returning to work per her request. Cleared to return.

## 2013-11-08 NOTE — Assessment & Plan Note (Signed)
Complicated by anxiety and grief ,  Now improving. Did not start the remeron and does not feel she needs it currently.

## 2013-11-13 ENCOUNTER — Other Ambulatory Visit: Payer: Self-pay | Admitting: Internal Medicine

## 2013-11-17 ENCOUNTER — Encounter: Payer: Self-pay | Admitting: Internal Medicine

## 2013-12-18 ENCOUNTER — Ambulatory Visit (INDEPENDENT_AMBULATORY_CARE_PROVIDER_SITE_OTHER): Payer: 59 | Admitting: Podiatry

## 2013-12-18 VITALS — Resp 16 | Ht 67.0 in | Wt 151.0 lb

## 2013-12-18 DIAGNOSIS — M722 Plantar fascial fibromatosis: Secondary | ICD-10-CM

## 2013-12-20 NOTE — Progress Notes (Signed)
Subjective:     Patient ID: Nicole Swanson, female   DOB: April 05, 1956, 58 y.o.   MRN: [id number]  HPI patient states that my heel is improving quite well and I'm having significant reduction of discomfort   Review of Systems     Objective:   Physical Exam Neurovascular status intact with significant discomfort in the left plantar fascia with different modalities we have done    Assessment:     Improving plantar fasciitis left    Plan:     Reviewed different treatment options I want her to continue and to think about this as a chronic condition and we reviewed physical therapy shoe gear modifications and activity modifications. Reappoint as needed

## 2013-12-31 ENCOUNTER — Other Ambulatory Visit: Payer: Self-pay | Admitting: Internal Medicine

## 2013-12-31 ENCOUNTER — Ambulatory Visit (INDEPENDENT_AMBULATORY_CARE_PROVIDER_SITE_OTHER): Payer: 59 | Admitting: Internal Medicine

## 2013-12-31 ENCOUNTER — Encounter: Payer: Self-pay | Admitting: Internal Medicine

## 2013-12-31 VITALS — BP 106/69 | HR 63 | Ht 67.0 in | Wt 152.5 lb

## 2013-12-31 DIAGNOSIS — R9431 Abnormal electrocardiogram [ECG] [EKG]: Secondary | ICD-10-CM

## 2013-12-31 DIAGNOSIS — I493 Ventricular premature depolarization: Secondary | ICD-10-CM

## 2013-12-31 DIAGNOSIS — I4949 Other premature depolarization: Secondary | ICD-10-CM

## 2013-12-31 DIAGNOSIS — I469 Cardiac arrest, cause unspecified: Secondary | ICD-10-CM

## 2013-12-31 DIAGNOSIS — Z79899 Other long term (current) drug therapy: Secondary | ICD-10-CM

## 2013-12-31 LAB — MDC_IDC_ENUM_SESS_TYPE_INCLINIC
Brady Statistic AP VP Percent: 0.02 %
Brady Statistic AS VP Percent: 0.03 %
Brady Statistic AS VS Percent: 33.52 %
Brady Statistic RA Percent Paced: 66.45 %
Brady Statistic RV Percent Paced: 0.04 %
HIGH POWER IMPEDANCE MEASURED VALUE: 51 Ohm
HIGH POWER IMPEDANCE MEASURED VALUE: 71 Ohm
HighPow Impedance: 38 Ohm
HighPow Impedance: 399 Ohm
Lead Channel Impedance Value: 399 Ohm
Lead Channel Impedance Value: 456 Ohm
Lead Channel Pacing Threshold Amplitude: 0.75 V
Lead Channel Pacing Threshold Pulse Width: 0.4 ms
Lead Channel Pacing Threshold Pulse Width: 0.4 ms
Lead Channel Sensing Intrinsic Amplitude: 12.5 mV
Lead Channel Setting Pacing Amplitude: 2 V
Lead Channel Setting Pacing Amplitude: 2 V
Lead Channel Setting Pacing Amplitude: 2.5 V
Lead Channel Setting Pacing Pulse Width: 0.4 ms
Lead Channel Setting Pacing Pulse Width: 0.4 ms
Lead Channel Setting Sensing Sensitivity: 0.45 mV
Lead Channel Setting Sensing Sensitivity: 0.45 mV
MDC IDC MSMT BATTERY VOLTAGE: 3.15 V
MDC IDC MSMT LEADCHNL RA PACING THRESHOLD AMPLITUDE: 0.5 V
MDC IDC MSMT LEADCHNL RA SENSING INTR AMPL: 2.5 mV
MDC IDC SESS DTM: 20150430090233
MDC IDC SET LEADCHNL RV PACING AMPLITUDE: 2.5 V
MDC IDC SET ZONE DETECTION INTERVAL: 350 ms
MDC IDC SET ZONE DETECTION INTERVAL: 300 ms
MDC IDC SET ZONE DETECTION INTERVAL: 350 ms
MDC IDC STAT BRADY AP VS PERCENT: 66.43 %
Zone Setting Detection Interval: 300 ms
Zone Setting Detection Interval: 360 ms
Zone Setting Detection Interval: 450 ms
Zone Setting Detection Interval: 360 ms
Zone Setting Detection Interval: 450 ms

## 2013-12-31 NOTE — Patient Instructions (Addendum)
Your physician recommends that you return for lab work in: 6 months  BMP  Magnesium   Your physician recommends that you have lab work today: BMP  Magnesium    Your physician wants you to follow-up in:1 year with Dr. [name].  You will receive a reminder letter in the mail two months in advance. If you don't receive a letter, please call our office to schedule the follow-up appointment.  Remote monitoring is used to monitor your Pacemaker of ICD from home on 05/01/2014. This monitoring reduces the number of office visits required to check your device to one time per year. It allows [place] to keep an eye on the functioning of your device to ensure it is working properly. You are scheduled for a device check from home on . You may send your transmission at any time that day. If you have a wireless device, the transmission will be sent automatically. After your physician reviews your transmission, you will receive a postcard with your next transmission date.  You will have a signal average EKG 01/01/14 at [facility] at 10:15 am

## 2013-12-31 NOTE — Progress Notes (Signed)
Patient Care Team: [name], MD as PCP - General (Internal Medicine)   HPI  Nicole Swanson is a 58 y.o. female Seen in followup for ICD implanted for aborted cardiac arrest. Her care initially had been at [facility]. She is status post dual chamber ICD implantation and generator replacement was recently done by me summer 2013   Echocardiogram 2011 demonstrated normal left ventricular function valve function and dimension sizes   She also has a history of ventricular ectopy for which she has been on sotalol in which Dr. Sharon Seller was going to increase prior to her transferring her care to [facility]    her husband has died. She finds great solace and God's everlasting  Arms  She has some dyspnea; it is unrelated to exertion. It is her impression that of her pulmonologist it is related to her allergies. She is not exercising.     Past Medical History  Diagnosis Date  . Aborted cardiac arrest 1994  . DVT (deep vein thrombosis) in pregnancy 1986    had heparin shots followed by coumadin  . Dual implantable cardiac defibrillator     Medtronic initially implanted in 1997  . 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 Irritable bowel syndrome   . PVC's (premature ventricular contractions)   . Ventricular tachycardia   . Family history of seizures     Question neurological versus cardiac    Past Surgical History  Procedure Laterality Date  . Cardiac defibrillator placement  1994    Dual chamber with pacemaker  . Nasal septum surgery  2006    [name]     Current Outpatient Prescriptions  Medication Sig Dispense Refill  . albuterol (PROVENTIL HFA;VENTOLIN HFA) 108 (90 BASE) MCG/ACT inhaler Inhale into the lungs every 6 (six) hours as needed for wheezing or shortness of breath.      Marland Kitchen aspirin 81 MG tablet Take 81 mg by mouth daily.        . Budesonide-Formoterol Fumarate (SYMBICORT IN) Inhale into the lungs as needed.      . celecoxib (CELEBREX) 100 MG  capsule Take 1 capsule (100 mg total) by mouth 2 (two) times daily.  60 capsule  5  . dexlansoprazole (DEXILANT) 60 MG capsule Take 1 capsule (60 mg total) by mouth daily.  30 capsule  5  . docusate sodium (COLACE) 250 MG capsule Take 250 mg by mouth 2 (two) times daily.      . fexofenadine (ALLEGRA) 180 MG tablet Take 1 tablet (180 mg total) by mouth daily.  30 tablet  5  . fluticasone (FLONASE) 50 MCG/ACT nasal spray Place 1 spray into both nostrils daily.  16 g  5  . montelukast (SINGULAIR) 10 MG tablet Take 1 tablet by mouth at bedtime.  90 tablet  1  . sotalol (BETAPACE) 120 MG tablet Take 1 tablet (120 mg total) by mouth 2 (two) times daily.  180 tablet  3  . zoster vaccine live, PF, (ZOSTAVAX) 96222 UNT/0.65ML injection Inject 19,400 Units into the skin once.  1 each  0  . Tdap (BOOSTRIX) 5-2.5-18.5 LF-MCG/0.5 injection Inject 0.5 mLs into the muscle once.  0.5 mL  0   No current facility-administered medications for this visit.    Allergies  Allergen Reactions  . Penicillins Rash  . Vancomycin Rash    Review of Systems negative except from HPI and PMH  Physical Exam BP 106/69  Pulse 63  Ht 5\' 7"  (1.702  m)  Wt 152 lb 8 oz (69.174 kg)  BMI 23.88 kg/m2 Well developed and well nourished in no acute distress HENT normal E scleral and icterus clear Neck Supple JVP flat; carotids brisk and full Clear to ausculation  2*Regular rate and rhythm, no murmurs gallops or rub Soft with active bowel sounds No clubbing cyanosis  Edema Alert and oriented, grossly normal motor and sensory function Skin Warm and Dry  ECG today demonstrates sinus rhythm at 63 Interval 17/08/43 there are T-wave inversions V1-V4  Assessment and  Plan  Aborted cardiac arrest  Abnormal echocardiogram  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  The device was reprogrammed to increase rate sensor.  Dyspnea on  exertion  Sinus node dysfunction/chronotropic incompetence  One of the issues that it is suggested by her ECG is whether   ARVC Is the mechanism of her initial cardiac arrest in her ventricular ectopy. Will obtain a signal average ECG  We have reprogrammed for rate sensor to see if we can't augment chronotropic response  We'll undertake a signal average ECG. For right now we will hold off on genetic testing.

## 2014-01-01 ENCOUNTER — Ambulatory Visit (HOSPITAL_COMMUNITY)
Admission: RE | Admit: 2014-01-01 | Discharge: 2014-01-01 | Disposition: A | Discharge status: Home / Self Care | Payer: 59 | Source: Ambulatory Visit | Attending: Internal Medicine | Admitting: Internal Medicine

## 2014-01-01 DIAGNOSIS — 419620001 Death: Secondary | SNOMED CT

## 2014-01-01 DIAGNOSIS — R9431 Abnormal electrocardiogram [ECG] [EKG]: Secondary | ICD-10-CM | POA: Insufficient documentation

## 2014-01-01 LAB — BASIC METABOLIC PANEL
BUN/Creatinine Ratio: 18 (ref 11–26)
BUN: 15 mg/dL (ref 8–27)
CALCIUM: 10.1 mg/dL (ref 8.7–10.3)
CO2: 23 mmol/L (ref 18–29)
Chloride: 101 mmol/L (ref 97–108)
Creatinine, Ser: 0.85 mg/dL (ref 0.57–1.00)
GFR calc Af Amer: 86 mL/min/{1.73_m2} (ref >59)
GFR calc non Af Amer: 75 mL/min/{1.73_m2} (ref >59)
GLUCOSE: 91 mg/dL (ref 65–99)
POTASSIUM: 4.3 mmol/L (ref 3.5–5.2)
SODIUM: 142 mmol/L (ref 134–144)

## 2014-01-01 LAB — MAGNESIUM: MAGNESIUM: 2.4 mg/dL (ref 1.6–2.6)

## 2014-01-01 DEATH — deceased

## 2014-01-04 ENCOUNTER — Telehealth: Payer: Self-pay | Admitting: *Deleted

## 2014-01-04 NOTE — Telephone Encounter (Signed)
Recd phone call from Dr. [name] instructing me to let patient know that her signal average ekg is normal  Informed patient of this  Patient verbalized understanding

## 2014-01-29 ENCOUNTER — Other Ambulatory Visit: Payer: Self-pay | Admitting: Adult Health

## 2014-01-29 NOTE — Telephone Encounter (Signed)
Refill

## 2014-03-15 ENCOUNTER — Encounter: Payer: Self-pay | Admitting: Internal Medicine

## 2014-03-15 ENCOUNTER — Ambulatory Visit (INDEPENDENT_AMBULATORY_CARE_PROVIDER_SITE_OTHER): Payer: 59 | Admitting: Internal Medicine

## 2014-03-15 VITALS — BP 122/70 | HR 65 | Temp 97.6°F | Resp 18 | Ht 67.0 in | Wt 153.2 lb

## 2014-03-15 DIAGNOSIS — F325 Major depressive disorder, single episode, in full remission: Secondary | ICD-10-CM

## 2014-03-15 DIAGNOSIS — M130 Polyarthritis, unspecified: Secondary | ICD-10-CM

## 2014-03-15 DIAGNOSIS — Z1239 Encounter for other screening for malignant neoplasm of breast: Secondary | ICD-10-CM

## 2014-03-15 DIAGNOSIS — M5442 Lumbago with sciatica, left side: Secondary | ICD-10-CM

## 2014-03-15 DIAGNOSIS — E785 Hyperlipidemia, unspecified: Secondary | ICD-10-CM

## 2014-03-15 DIAGNOSIS — M5126 Other intervertebral disc displacement, lumbar region: Secondary | ICD-10-CM

## 2014-03-15 DIAGNOSIS — I4949 Other premature depolarization: Secondary | ICD-10-CM

## 2014-03-15 DIAGNOSIS — M5430 Sciatica, unspecified side: Secondary | ICD-10-CM

## 2014-03-15 DIAGNOSIS — I493 Ventricular premature depolarization: Secondary | ICD-10-CM

## 2014-03-15 DIAGNOSIS — Z79899 Other long term (current) drug therapy: Secondary | ICD-10-CM

## 2014-03-15 MED ORDER — CELECOXIB 200 MG PO CAPS: 200.0000 mg | ORAL_CAPSULE | Freq: Two times a day (BID) | ORAL | Status: DC

## 2014-03-15 MED ORDER — TRAMADOL HCL 50 MG PO TABS: 50.0000 mg | ORAL_TABLET | Freq: Three times a day (TID) | ORAL | Status: DC | PRN

## 2014-03-15 MED ORDER — GABAPENTIN 100 MG PO CAPS: 100.0000 mg | ORAL_CAPSULE | Freq: Three times a day (TID) | ORAL | Status: DC

## 2014-03-15 NOTE — Assessment & Plan Note (Deleted)
Prior trial of nabumetone and  meloxicam  continue celebrex,  Add tramadol and gabapentin   Referral to Dr Sharlet Salina

## 2014-03-15 NOTE — Patient Instructions (Signed)
I have increased your celebrex to 200 mg twice daily adding gabapentin 10 mg at bedtime,  May increase to 200 mg after a week  Adding tramadol for severe pain, up to 3 daily (you may want to try plain tylenol first)  Plain lumbar spine films at [facility]  If no improvement in a few week we will refer you to Dr Loistine Chance , the [facility] [place] physiatrist   Return for fasting labs and liver panel  3d Mammogram in august/September

## 2014-03-15 NOTE — Progress Notes (Signed)
Patient ID: Nicole Swanson, female   DOB: 04/17/1956, 58 y.o.   MRN: [id number] .  Patient Active Problem List   Diagnosis Date Noted  . Polyarthritis 03/16/2014  . Thrombocytopenia, unspecified 09/11/2013  . Other malaise and fatigue 09/10/2013  . Major depressive disorder, single episode 09/10/2013  . Raynaud phenomenon 04/17/2013  . Low back pain 12/05/2012  . Shortness of breath 06/05/2012  . Aborted cardiac arrest   . ICD-Medtronic   . PVC's / ventricular tachycardia   . 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 Irritable bowel syndrome     Subjective:  CC:   Chief Complaint  Patient presents with  . Back Pain    pain radiates down left leg. from center of back to hip.    HPI:   Nicole Swanson is a 58 y.o. female who presents for Persistent back pain for the last 3 weeks, with sciatica on left side.  She was treated with chiropractic manipulation by Dr. Francisca December for the past 3 weeks with moderate but not complete improvement in symptoms.  No history of unusual physical activities.  The pain is persistent and waking her up at night.  It is also aggravating  by bending at waist,  Bending at the knee .  She also reports diffuse joint pain involving knees,  Hands and shoulders. Using celebrex 100 mg bid , wantsto avoid narcotics xince she is still working.  Needs follow up labs due to chronic use of sotalol.    Past Medical History  Diagnosis Date  . Aborted cardiac arrest 1994  . DVT (deep vein thrombosis) in pregnancy 1986    had heparin shots followed by coumadin  . Dual implantable cardiac defibrillator     Medtronic initially implanted in 1997  . 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 Irritable bowel syndrome   . PVC's (premature ventricular contractions)   . Ventricular tachycardia   . Family history of seizures     Question neurological versus cardiac    Past Surgical History  Procedure Laterality Date  . Cardiac defibrillator  placement  1994    Dual chamber with pacemaker  . Nasal septum surgery  2006    [name]        The following portions of the patient's history were reviewed and updated as appropriate: Allergies, current medications, and problem list.    Review of Systems:   Patient denies headache, fevers, malaise, unintentional weight loss, skin rash, eye pain, sinus congestion and sinus pain, sore throat, dysphagia,  hemoptysis , cough, dyspnea, wheezing, chest pain, palpitations, orthopnea, edema, abdominal pain, nausea, melena, diarrhea, constipation, flank pain, dysuria, hematuria, urinary  Frequency, nocturia, numbness, tingling, seizures,  Focal weakness, Loss of consciousness,  Tremor, insomnia, depression, anxiety, and suicidal ideation.     History   Social History  . Marital Status: Married    Spouse Name: N/A    Number of Children: N/A  . Years of Education: N/A   Occupational History  . Not on file.   Social History Main Topics  . Smoking status: Former Smoker    Types: Cigarettes    Quit date: 06/04/1995  . Smokeless tobacco: Never Used  . Alcohol Use: Yes     Comment: occasional  . Drug Use: No  . Sexual Activity: Not on file   Other Topics Concern  . Not on file   Social History Narrative  . No narrative on file    Objective:  Filed Vitals:   03/15/14 1410  BP: 122/70  Pulse: 65  Temp: 97.6 F (36.4 C)  Resp: 18     General appearance: alert, cooperative and appears stated age Ears: normal TM's and external ear canals both ears Throat: lips, mucosa, and tongue normal; teeth and gums normal Neck: no adenopathy, no carotid bruit, supple, symmetrical, trachea midline and thyroid not enlarged, symmetric, no tenderness/mass/nodules Back: symmetric, no curvature. ROM normal. No CVA tenderness. Lungs: clear to auscultation bilaterally Heart: regular rate and rhythm, S1, S2 normal, no murmur, click, rub or gallop Abdomen: soft, non-tender; bowel sounds  normal; no masses,  no organomegaly Pulses: 2+ and symmetric Skin: Skin color, texture, turgor normal. No rashes or lesions Lymph nodes: Cervical, supraclavicular, and axillary nodes normal. Neuro: CNs 2-12 intact. DTRs 2+/4 in biceps, brachioradialis, patellars and achilles. Muscle strength 5/5 in upper and lower exremities. Fine resting tremor bilaterally both hands cerebellar function normal. Romberg negative.  No pronator drift.   Gait normal.   Assessment and Plan:  PVC's / ventricular tachycardia S/p defibrillator,  On sotalol..    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  Prior trial of nabumetone and  meloxicam  Were not helpful.  Will continue celebrex, but increased dose to 200 mg bid.  Add tylenol vs tramadol and qhs gabapentin   Referral to Dr Loistine Chance if no improvement in 3-4 weeks. Last CT of lumbar spine was 2014    Major depressive disorder, single episode Aggravated by husband Tom's lost battle with lung cancer.  She has returned to work, no longer using medication. Son is now living locally at least for the next year.,   Polyarthritis She is at the appropriate age to consider PMR .Marland Kitchen  Screening labs for autoimmune/rheumatologic disorders advised.     Updated Medication List Outpatient Encounter Prescriptions as of 03/15/2014  Medication Sig  . albuterol (PROVENTIL HFA;VENTOLIN HFA) 108 (90 BASE) MCG/ACT inhaler Inhale into the lungs every 6 (six) hours as needed for wheezing or shortness of breath.  Marland Kitchen aspirin 81 MG tablet Take 81 mg by mouth daily.    . Budesonide-Formoterol Fumarate (SYMBICORT IN) Inhale into the lungs as needed.  . celecoxib (CELEBREX) 200 MG capsule Take 1 capsule (200 mg total) by mouth 2 (two) times daily.  . Cetirizine HCl 10 MG CAPS Take 10 mg by mouth daily.  Marland Kitchen dexlansoprazole (DEXILANT) 60 MG capsule Take 1 capsule (60 mg total) by mouth daily.  Marland Kitchen docusate sodium (COLACE) 250 MG capsule Take 250 mg by mouth 2 (two)  times daily.  . fluticasone (FLONASE) 50 MCG/ACT nasal spray Place 1 spray into both nostrils daily.  . montelukast (SINGULAIR) 10 MG tablet Take 1 tablet by mouth at bedtime.  . sotalol (BETAPACE) 120 MG tablet Take 1 tablet (120 mg total) by mouth 2 (two) times daily.  . [DISCONTINUED] celecoxib (CELEBREX) 100 MG capsule Take 1 capsule (100 mg total) by mouth 2 (two) times daily.  . [DISCONTINUED] nabumetone (RELAFEN) 500 MG tablet TAKE ONE TABLET BY MOUTH 2 TIMES A DAY  . gabapentin (NEURONTIN) 100 MG capsule Take 1 capsule (100 mg total) by mouth 3 (three) times daily.  . traMADol (ULTRAM) 50 MG tablet Take 1 tablet (50 mg total) by mouth every 8 (eight) hours as needed.  . [DISCONTINUED] fexofenadine (ALLEGRA) 180 MG tablet Take 1 tablet (180 mg total) by mouth daily.  . [DISCONTINUED] Tdap (BOOSTRIX) 5-2.5-18.5 LF-MCG/0.5 injection Inject 0.5 mLs into the muscle once.  . [  DISCONTINUED] zoster vaccine live, PF, (ZOSTAVAX) 59563 UNT/0.65ML injection Inject 19,400 Units into the skin once.     Orders Placed This Encounter  Procedures  . DG Lumbar Spine Complete  . MM DIGITAL SCREENING BILATERAL  . Comprehensive metabolic panel  . Sedimentation rate  . ANA w/Reflex if Positive  . Rheumatoid factor  . CBC with Differential  . Lipid panel    Return in about 3 months (around 06/15/2014).

## 2014-03-15 NOTE — Progress Notes (Signed)
Pre-visit discussion using our clinic review tool. No additional management support is needed unless otherwise documented below in the visit note.  

## 2014-03-16 DIAGNOSIS — M130 Polyarthritis, unspecified: Secondary | ICD-10-CM | POA: Insufficient documentation

## 2014-03-16 NOTE — Assessment & Plan Note (Signed)
Aggravated by husband Tom's lost battle with lung cancer.  She has returned to work, no longer using medication. Son is now living locally at least for the next year.,

## 2014-03-16 NOTE — Assessment & Plan Note (Signed)
She is at the appropriate age to consider PMR .Marland Kitchen  Screening labs for autoimmune/rheumatologic disorders advised.

## 2014-03-16 NOTE — Assessment & Plan Note (Signed)
Now with sciatica.  Prior trial of nabumetone and  meloxicam  Were not helpful.  Will continue celebrex, but increased dose to 200 mg bid.  Add tylenol vs tramadol and qhs gabapentin   Referral to Dr Loistine Chance if no improvement in 3-4 weeks. Last CT of lumbar spine was 2014

## 2014-03-16 NOTE — Assessment & Plan Note (Signed)
S/p defibrillator,  On sotalol.Marland Kitchen

## 2014-03-17 ENCOUNTER — Ambulatory Visit: Payer: Self-pay | Admitting: Internal Medicine

## 2014-03-19 ENCOUNTER — Telehealth: Payer: Self-pay | Admitting: Internal Medicine

## 2014-03-19 ENCOUNTER — Other Ambulatory Visit: Payer: Self-pay | Admitting: Internal Medicine

## 2014-03-19 DIAGNOSIS — M5442 Lumbago with sciatica, left side: Secondary | ICD-10-CM

## 2014-03-19 DIAGNOSIS — M5432 Sciatica, left side: Secondary | ICD-10-CM

## 2014-03-19 MED ORDER — PEG 3350-KCL-NA BICARB-NACL 420 G PO SOLR: ORAL | Status: DC

## 2014-03-19 NOTE — Telephone Encounter (Signed)
Patient notified and will pick up Monday due to going out of town.

## 2014-03-19 NOTE — Telephone Encounter (Signed)
Her spinal CT showed some progression of the degenerative changes with bone spurring noted at the L2-L3 level, which is the level above the sciatic nerve.  If her pain is not improving with what we did at her visit, i recommend an evaluation by a neurosurgeon or a pain Clinic bc they might be able to give her an epidural or facet joint injection  Incidentally , radiologist commented on a LARGE amount of stool throughout the colon.  Would she like a bowel cleanser ?

## 2014-03-19 NOTE — Assessment & Plan Note (Signed)
Progressive changes by lumbar Ct.  Referral to Pain clinic for epidurals in process.

## 2014-03-19 NOTE — Telephone Encounter (Signed)
Patient notified and would like to proceed with injection before surgery would like to do surgery only as last resort. Patient also would like to try a bowel cleanser.

## 2014-03-19 NOTE — Telephone Encounter (Signed)
I will send go lytely to her pharmacy. It's the bowel prep we use before colonoscopy.  She will only need to drink 3 or 4 glasses to get adequately cleaned out.   I just realized it went to [facility], so if she wants it sent to some other pharmacy for the weekend clean out,  You can call it in elsewhere

## 2014-03-29 ENCOUNTER — Encounter: Payer: Self-pay | Admitting: Internal Medicine

## 2014-04-05 ENCOUNTER — Ambulatory Visit (INDEPENDENT_AMBULATORY_CARE_PROVIDER_SITE_OTHER): Payer: Managed Care, Other (non HMO) | Admitting: *Deleted

## 2014-04-05 DIAGNOSIS — I469 Cardiac arrest, cause unspecified: Secondary | ICD-10-CM

## 2014-04-06 ENCOUNTER — Telehealth: Payer: Self-pay | Admitting: Cardiology

## 2014-04-06 NOTE — Telephone Encounter (Signed)
Spoke with pt and reminded pt of remote transmission that is due today. Pt verbalized understanding.   

## 2014-04-07 NOTE — Progress Notes (Signed)
Remote ICD transmission.   

## 2014-04-12 LAB — MDC_IDC_ENUM_SESS_TYPE_REMOTE
Battery Voltage: 3.12 V
Brady Statistic AP VP Percent: 0.02 %
Brady Statistic AP VS Percent: 62.8 %
Brady Statistic AS VP Percent: 0.03 %
Brady Statistic AS VS Percent: 37.16 %
Brady Statistic RA Percent Paced: 62.82 %
Brady Statistic RV Percent Paced: 0.05 %
HIGH POWER IMPEDANCE MEASURED VALUE: 38 Ohm
HighPow Impedance: 437 Ohm
HighPow Impedance: 51 Ohm
HighPow Impedance: 74 Ohm
Lead Channel Impedance Value: 437 Ohm
Lead Channel Pacing Threshold Amplitude: 0.875 V
Lead Channel Pacing Threshold Pulse Width: 0.4 ms
Lead Channel Sensing Intrinsic Amplitude: 2.5 mV
Lead Channel Sensing Intrinsic Amplitude: 2.5 mV
Lead Channel Sensing Intrinsic Amplitude: 9.5 mV
Lead Channel Sensing Intrinsic Amplitude: 9.5 mV
Lead Channel Setting Pacing Amplitude: 2 V
Lead Channel Setting Pacing Amplitude: 2.5 V
Lead Channel Setting Pacing Pulse Width: 0.4 ms
MDC IDC MSMT LEADCHNL RA IMPEDANCE VALUE: 456 Ohm
MDC IDC MSMT LEADCHNL RA PACING THRESHOLD AMPLITUDE: 0.5 V
MDC IDC MSMT LEADCHNL RA PACING THRESHOLD PULSEWIDTH: 0.4 ms
MDC IDC SESS DTM: 20150805013354
MDC IDC SET LEADCHNL RV SENSING SENSITIVITY: 0.45 mV
MDC IDC SET ZONE DETECTION INTERVAL: 450 ms
Zone Setting Detection Interval: 300 ms
Zone Setting Detection Interval: 350 ms
Zone Setting Detection Interval: 360 ms

## 2014-04-23 ENCOUNTER — Encounter: Payer: Self-pay | Admitting: Podiatry

## 2014-04-23 ENCOUNTER — Ambulatory Visit (INDEPENDENT_AMBULATORY_CARE_PROVIDER_SITE_OTHER): Payer: 59 | Admitting: Podiatry

## 2014-04-23 ENCOUNTER — Other Ambulatory Visit: Payer: Self-pay | Admitting: Internal Medicine

## 2014-04-23 ENCOUNTER — Ambulatory Visit (INDEPENDENT_AMBULATORY_CARE_PROVIDER_SITE_OTHER): Payer: 59

## 2014-04-23 VITALS — BP 119/72 | HR 78 | Resp 16

## 2014-04-23 DIAGNOSIS — M722 Plantar fascial fibromatosis: Secondary | ICD-10-CM

## 2014-04-23 DIAGNOSIS — M7732 Calcaneal spur, left foot: Secondary | ICD-10-CM

## 2014-04-23 DIAGNOSIS — M7730 Calcaneal spur, unspecified foot: Secondary | ICD-10-CM

## 2014-04-23 MED ORDER — TRIAMCINOLONE ACETONIDE 10 MG/ML IJ SUSP
10.0000 mg | Freq: Once | INTRAMUSCULAR | Status: AC
Start: 2014-04-23 — End: 2014-04-23
  Administered 2014-04-23: 10 mg

## 2014-04-25 NOTE — Progress Notes (Signed)
Subjective:     Patient ID: Nicole Swanson, female   DOB: 10-31-1955, 58 y.o.   MRN: [id number]  HPI patient states I'm getting some chronic pain in my left heel and it just doesn't seem like it's getting better and I have to admit it's been really bothering   Review of Systems     Objective:   Physical Exam Neurovascular status intact with intense discomfort on the plantar aspect of the left heel medial and lateral side and patient is noted to have a thin heel with diminished fat pad    Assessment:     Chronic plantar fasciitis with heel pain atrophy as part of her problem    Plan:     Did a very careful lateral injection 2 mg Kenalog 5 mg Xylocaine to reduce inflammation and at this time did discuss considerations for shockwave or open surgical procedure. Patient has opted for shockwave which I do think is in her best interest and I reviewed the procedure and she is scheduled next week to have this accomplished

## 2014-04-27 ENCOUNTER — Encounter: Payer: Self-pay | Admitting: Cardiology

## 2014-04-29 ENCOUNTER — Encounter: Payer: Self-pay | Admitting: Cardiology

## 2014-04-30 ENCOUNTER — Ambulatory Visit (INDEPENDENT_AMBULATORY_CARE_PROVIDER_SITE_OTHER): Payer: 59 | Admitting: Podiatry

## 2014-04-30 VITALS — BP 103/63 | HR 75 | Resp 16

## 2014-04-30 DIAGNOSIS — M722 Plantar fascial fibromatosis: Secondary | ICD-10-CM

## 2014-04-30 NOTE — Progress Notes (Signed)
Subjective:     Patient ID: Nicole Swanson, female   DOB: 10-09-1955, 58 y.o.   MRN: [id number]  HPI patient presents stating I'm here for my first shockwave   Review of Systems     Objective:   Physical Exam Neurovascular status intact with pain in the area of the plantar heel left medial side    Assessment:     Plantar fasciitis left heel    Plan:     Shockwave 2500 shocks 16 frequency 3.6 intensity

## 2014-05-03 ENCOUNTER — Encounter: Payer: Self-pay | Admitting: Internal Medicine

## 2014-05-04 DIAGNOSIS — 419620001 Death: Secondary | SNOMED CT

## 2014-05-04 DEATH — deceased

## 2014-05-07 ENCOUNTER — Other Ambulatory Visit: Payer: Self-pay | Admitting: Internal Medicine

## 2014-05-07 ENCOUNTER — Ambulatory Visit (INDEPENDENT_AMBULATORY_CARE_PROVIDER_SITE_OTHER): Payer: 59 | Admitting: Podiatry

## 2014-05-07 VITALS — BP 109/67 | HR 86 | Resp 16

## 2014-05-07 DIAGNOSIS — M722 Plantar fascial fibromatosis: Secondary | ICD-10-CM

## 2014-05-10 NOTE — Progress Notes (Signed)
Subjective:     Patient ID: Nicole Swanson, female   DOB: 06-26-1956, 58 y.o.   MRN: [id number]  HPI patient presents with heel pain that she states is improving somewhat shockwave   Review of Systems     Objective:   Physical Exam Neurovascular status intact with significant diminishment of discomfort plantar aspect left heel upon palpation    Assessment:     Improved fasciitis with shockwave    Plan:     2500 shocks administered 4.2 frequency 16.0 usage

## 2014-05-14 ENCOUNTER — Ambulatory Visit (INDEPENDENT_AMBULATORY_CARE_PROVIDER_SITE_OTHER): Payer: 59 | Admitting: Podiatry

## 2014-05-14 VITALS — BP 115/73 | HR 83 | Resp 16

## 2014-05-14 DIAGNOSIS — M722 Plantar fascial fibromatosis: Secondary | ICD-10-CM

## 2014-05-14 NOTE — Progress Notes (Signed)
Subjective:     Patient ID: Nicole Swanson, female   DOB: 1956/03/26, 58 y.o.   MRN: [id number]  HPI patient presents stating my heel is starting to improve   Review of Systems     Objective:   Physical Exam Neurovascular unchanged with diminished discomfort plantar heel left    Assessment:     Improved plantar heel left with shockwave therapy    Plan:     2500 shocks 4.6 on intensity 16 frequency tolerated well by patient and reappoint 4 weeks

## 2014-05-21 ENCOUNTER — Other Ambulatory Visit: Payer: Self-pay

## 2014-05-21 DIAGNOSIS — I469 Cardiac arrest, cause unspecified: Secondary | ICD-10-CM

## 2014-05-21 MED ORDER — SOTALOL HCL 120 MG PO TABS: 120.0000 mg | ORAL_TABLET | Freq: Two times a day (BID) | ORAL | Status: DC

## 2014-05-21 NOTE — Telephone Encounter (Signed)
Refill sent for sotalol

## 2014-06-11 ENCOUNTER — Ambulatory Visit (INDEPENDENT_AMBULATORY_CARE_PROVIDER_SITE_OTHER): Payer: 59 | Admitting: Podiatry

## 2014-06-11 DIAGNOSIS — M722 Plantar fascial fibromatosis: Secondary | ICD-10-CM

## 2014-06-11 NOTE — Progress Notes (Signed)
Bar   freq  Pulses

## 2014-06-14 NOTE — Progress Notes (Signed)
Subjective:     Patient ID: Nicole Swanson, female   DOB: 07/25/1956, 58 y.o.   MRN: [id number]  HPI patient states her heel is feeling much better   Review of Systems     Objective:   Physical Exam Neurovascular status intact with mild discomfort plantar heel    Assessment:     Improved fasciitis with shockwave therapy    Plan:     Shockwave #4 administered today 3.6 on intensity 2500 shocks 16 frequency tolerated well

## 2014-06-30 ENCOUNTER — Telehealth: Payer: Self-pay

## 2014-06-30 NOTE — Telephone Encounter (Signed)
Yes,  And if she is having abdominal pain and is still taking celebrex, stop it immediately.  Confirm she is taking Dexilant or some other PPI for gastritis

## 2014-06-30 NOTE — Telephone Encounter (Signed)
Scheduled patient for 915 Friday ok?

## 2014-06-30 NOTE — Telephone Encounter (Signed)
Patient notified to stop celebrex until seen in office.

## 2014-06-30 NOTE — Telephone Encounter (Signed)
The patient called and is hoping to be worked in sooner (she has an apt for 11/16).  She states her abdominal pain and back pain are worsening.

## 2014-07-02 ENCOUNTER — Encounter: Payer: Self-pay | Admitting: Internal Medicine

## 2014-07-02 ENCOUNTER — Ambulatory Visit: Payer: Self-pay | Admitting: Internal Medicine

## 2014-07-02 ENCOUNTER — Ambulatory Visit (INDEPENDENT_AMBULATORY_CARE_PROVIDER_SITE_OTHER): Payer: 59 | Admitting: Internal Medicine

## 2014-07-02 ENCOUNTER — Telehealth: Payer: Self-pay | Admitting: Internal Medicine

## 2014-07-02 VITALS — BP 118/76 | HR 75 | Temp 97.5°F | Resp 16 | Ht 67.0 in | Wt 148.5 lb

## 2014-07-02 DIAGNOSIS — R1030 Lower abdominal pain, unspecified: Secondary | ICD-10-CM

## 2014-07-02 DIAGNOSIS — E785 Hyperlipidemia, unspecified: Secondary | ICD-10-CM

## 2014-07-02 DIAGNOSIS — K5909 Other constipation: Secondary | ICD-10-CM

## 2014-07-02 DIAGNOSIS — M5126 Other intervertebral disc displacement, lumbar region: Secondary | ICD-10-CM

## 2014-07-02 DIAGNOSIS — D696 Thrombocytopenia, unspecified: Secondary | ICD-10-CM

## 2014-07-02 DIAGNOSIS — R1032 Left lower quadrant pain: Secondary | ICD-10-CM

## 2014-07-02 DIAGNOSIS — M130 Polyarthritis, unspecified: Secondary | ICD-10-CM

## 2014-07-02 DIAGNOSIS — R197 Diarrhea, unspecified: Secondary | ICD-10-CM

## 2014-07-02 DIAGNOSIS — Z79899 Other long term (current) drug therapy: Secondary | ICD-10-CM

## 2014-07-02 LAB — URINALYSIS, ROUTINE W REFLEX MICROSCOPIC
Bilirubin Urine: NEGATIVE
Hgb urine dipstick: NEGATIVE
Ketones, ur: NEGATIVE
Leukocytes, UA: NEGATIVE
Nitrite: NEGATIVE
PH: 5.5 (ref 5.0–8.0)
TOTAL PROTEIN, URINE-UPE24: NEGATIVE
URINE GLUCOSE: NEGATIVE
UROBILINOGEN UA: 0.2 (ref 0.0–1.0)

## 2014-07-02 LAB — COMPREHENSIVE METABOLIC PANEL
ALK PHOS: 87 U/L (ref 39–117)
ALT: 25 U/L (ref 0–35)
AST: 28 U/L (ref 0–37)
Albumin: 3.9 g/dL (ref 3.5–5.2)
BILIRUBIN TOTAL: 0.5 mg/dL (ref 0.2–1.2)
BUN: 14 mg/dL (ref 6–23)
CO2: 28 mEq/L (ref 19–32)
Calcium: 9.6 mg/dL (ref 8.4–10.5)
Chloride: 105 mEq/L (ref 96–112)
Creatinine, Ser: 0.9 mg/dL (ref 0.4–1.2)
GFR: 65.24 mL/min (ref >60.00)
GLUCOSE: 105 mg/dL — AB (ref 70–99)
POTASSIUM: 4.5 meq/L (ref 3.5–5.1)
Sodium: 138 mEq/L (ref 135–145)
Total Protein: 7.3 g/dL (ref 6.0–8.3)

## 2014-07-02 LAB — POCT URINALYSIS DIPSTICK
BILIRUBIN UA: NEGATIVE
Glucose, UA: NEGATIVE
Ketones, UA: NEGATIVE
LEUKOCYTES UA: NEGATIVE
NITRITE UA: NEGATIVE
Protein, UA: NEGATIVE
RBC UA: NEGATIVE
Spec Grav, UA: 1.025
UROBILINOGEN UA: 0.2
pH, UA: 5

## 2014-07-02 LAB — CBC WITH DIFFERENTIAL/PLATELET
BASOS ABS: 0 10*3/uL (ref 0.0–0.1)
Basophils Relative: 0.6 % (ref 0.0–3.0)
Eosinophils Absolute: 0.2 10*3/uL (ref 0.0–0.7)
Eosinophils Relative: 4.3 % (ref 0.0–5.0)
HEMATOCRIT: 40.5 % (ref 36.0–46.0)
HEMOGLOBIN: 13.1 g/dL (ref 12.0–15.0)
LYMPHS ABS: 1.2 10*3/uL (ref 0.7–4.0)
LYMPHS PCT: 31.9 % (ref 12.0–46.0)
MCHC: 32.5 g/dL (ref 30.0–36.0)
MCV: 86.5 fl (ref 78.0–100.0)
MONOS PCT: 8.4 % (ref 3.0–12.0)
Monocytes Absolute: 0.3 10*3/uL (ref 0.1–1.0)
Neutro Abs: 2 10*3/uL (ref 1.4–7.7)
Neutrophils Relative %: 54.8 % (ref 43.0–77.0)
PLATELETS: 129 10*3/uL — AB (ref 150.0–400.0)
RBC: 4.68 Mil/uL (ref 3.87–5.11)
RDW: 13.4 % (ref 11.5–15.5)
WBC: 3.7 10*3/uL — ABNORMAL LOW (ref 4.0–10.5)

## 2014-07-02 LAB — LIPID PANEL
CHOLESTEROL: 258 mg/dL — AB (ref 0–200)
HDL: 79.3 mg/dL (ref >39.00)
LDL Cholesterol: 162 mg/dL — ABNORMAL HIGH (ref 0–99)
NONHDL: 178.7
Total CHOL/HDL Ratio: 3
Triglycerides: 86 mg/dL (ref 0.0–149.0)
VLDL: 17.2 mg/dL (ref 0.0–40.0)

## 2014-07-02 LAB — SEDIMENTATION RATE: SED RATE: 14 mm/h (ref 0–22)

## 2014-07-02 LAB — LIPASE: LIPASE: 37 U/L (ref 11.0–59.0)

## 2014-07-02 MED ORDER — LACTULOSE 20 GM/30ML PO SOLN: 30.0000 mL | ORAL | Status: DC | PRN

## 2014-07-02 NOTE — Patient Instructions (Signed)
Palin films to evaluate degree of constipation  Stop the Septra   I will call in lactulose by the end of the day to relieve constipation

## 2014-07-02 NOTE — Progress Notes (Signed)
Pre-visit discussion using our clinic review tool. No additional management support is needed unless otherwise documented below in the visit note.  

## 2014-07-02 NOTE — Progress Notes (Signed)
Patient ID: Nicole Swanson, female   DOB: 1956/06/15, 58 y.o.   MRN: [id number]  Patient Active Problem List   Diagnosis Date Noted  . Abdominal pain, left lower quadrant 07/02/2014  . Polyarthritis 03/16/2014  . Thrombocytopenia, unspecified 09/11/2013  . Other malaise and fatigue 09/10/2013  . Major depressive disorder, single episode 09/10/2013  . Raynaud phenomenon 04/17/2013  . Low back pain 12/05/2012  . Shortness of breath 06/10/2012  . Aborted cardiac arrest   . ICD-Medtronic   . PVC's / ventricular tachycardia   . 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 Irritable bowel syndrome     Subjective:  CC:   Chief Complaint  Patient presents with  . Back Pain    Started in back saw employee health told she had uti.  Marland Kitchen Abdominal Pain    Having yellow tinged stools.    HPI:   Nicole Swanson is a 58 y.o. female who presents for  History  Of sudden onset of Bilateral lower back pain which progressed to involve the lower  Abdomen.  Pain described initially as "spikes" of pain in lower back.  Then abd started hurting and felt urinary frequency and urgency.  Back pain is described as feeling different than her history of orthopedic back pain from disk herniation at L2-L3.  Feels like my insides are burning up,  Lasts for  a few minutes at a time.  Went to [facility] and was placed on 10 day course of Septra DS for supposed UTI.  Currently on Day 9.   Stools have been yellowish and granular with a mashed potato consistency since yesterday.  Abd pain improves transiently with stooling   History of chronic constipation but avoids using BFLs on a regular basis because she has a history of fecal urgency and incontinence with miralax .  Using stool softener daily.       Past Medical History  Diagnosis Date  . Aborted cardiac arrest 1994  . DVT (deep vein thrombosis) in pregnancy 1986    had heparin shots followed by coumadin  . Dual implantable cardiac defibrillator      Medtronic initially implanted in 1997  . 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 Irritable bowel syndrome   . PVC's (premature ventricular contractions)   . Ventricular tachycardia   . Family history of seizures     Question neurological versus cardiac    Past Surgical History  Procedure Laterality Date  . Cardiac defibrillator placement  1994    Dual chamber with pacemaker  . Nasal septum surgery  2006    [name]        The following portions of the patient's history were reviewed and updated as appropriate: Allergies, current medications, and problem list.    Review of Systems:   Patient denies headache, fevers, malaise, unintentional weight loss, skin rash, eye pain, sinus congestion and sinus pain, sore throat, dysphagia,  hemoptysis , cough, dyspnea, wheezing, chest pain, palpitations, orthopnea, edema, abdominal pain, nausea, melena, diarrhea, constipation, flank pain, dysuria, hematuria, urinary  Frequency, nocturia, numbness, tingling, seizures,  Focal weakness, Loss of consciousness,  Tremor, insomnia, depression, anxiety, and suicidal ideation.     History   Social History  . Marital Status: Widowed    Spouse Name: N/A    Number of Children: N/A  . Years of Education: N/A   Occupational History  . Not on file.   Social History Main Topics  . Smoking  status: Former Smoker    Types: Cigarettes    Quit date: 06/04/1995  . Smokeless tobacco: Never Used  . Alcohol Use: Yes     Comment: occasional  . Drug Use: No  . Sexual Activity: Not on file   Other Topics Concern  . Not on file   Social History Narrative  . No narrative on file    Objective:  Filed Vitals:   07/02/14 0920  BP: 118/76  Pulse: 75  Temp: 97.5 F (36.4 C)  Resp: 16     General appearance: alert, cooperative and appears stated age Ears: normal TM's and external ear canals both ears Throat: lips, mucosa, and tongue normal; teeth and gums normal Neck:  no adenopathy, no carotid bruit, supple, symmetrical, trachea midline and thyroid not enlarged, symmetric, no tenderness/mass/nodules Back: symmetric, no curvature. ROM normal. No CVA tenderness. Lungs: clear to auscultation bilaterally Heart: regular rate and rhythm, S1, S2 normal, no murmur, click, rub or gallop Abdomen: soft, non-tender; bowel sounds normal; no masses,  no organomegaly Pulses: 2+ and symmetric Skin: Skin color, texture, turgor normal. No rashes or lesions Lymph nodes: Cervical, supraclavicular, and axillary nodes normal.  Assessment and Plan:  Abdominal pain, left lower quadrant There is no evidence of UTI on today's UA and 9 days of Septra is over treatment. Plain suggest constipation as cause,  With no signs of partial obstruction.  Will treat with lactulose, given her chronic constipation and rule out c dificile although this is less likely,    If  There is no improvement in 2-3 days , discussed that a CT of abd and pelvis  may be necessary to rule out diverticulitis. Lab Results  Component Value Date   WBC 3.7* 07/02/2014   HGB 13.1 07/02/2014   HCT 40.5 07/02/2014   MCV 86.5 07/02/2014   PLT 129.0* 07/02/2014      Updated Medication List Outpatient Encounter Prescriptions as of 07/02/2014  Medication Sig  . aspirin 81 MG tablet Take 81 mg by mouth daily.    . Cetirizine HCl 10 MG CAPS Take 10 mg by mouth daily.  Marland Kitchen DEXILANT 60 MG capsule Take 1 capsule (60 mg total) by mouth daily.  Marland Kitchen docusate sodium (COLACE) 250 MG capsule Take 250 mg by mouth 2 (two) times daily.  . fluticasone (FLONASE) 50 MCG/ACT nasal spray Place 1 spray into both nostrils daily.  . montelukast (SINGULAIR) 10 MG tablet TAKE 1 TABLET BY MOUTH AT BEDTIME.  Marland Kitchen phenazopyridine (PYRIDIUM) 200 MG tablet Take 200 mg by mouth 3 (three) times daily as needed for pain.  . sotalol (BETAPACE) 120 MG tablet Take 1 tablet (120 mg total) by mouth 2 (two) times daily.  Marland Kitchen sulfamethoxazole-trimethoprim  (SEPTRA DS) 800-160 MG per tablet Take 1 tablet by mouth 2 (two) times daily.  . traMADol (ULTRAM) 50 MG tablet Take 1 tablet (50 mg total) by mouth every 8 (eight) hours as needed.  Marland Kitchen albuterol (PROVENTIL HFA;VENTOLIN HFA) 108 (90 BASE) MCG/ACT inhaler Inhale into the lungs every 6 (six) hours as needed for wheezing or shortness of breath.  . Budesonide-Formoterol Fumarate (SYMBICORT IN) Inhale into the lungs as needed.  . celecoxib (CELEBREX) 200 MG capsule Take 1 capsule (200 mg total) by mouth 2 (two) times daily.  Marland Kitchen gabapentin (NEURONTIN) 100 MG capsule Take 1 capsule (100 mg total) by mouth 3 (three) times daily.  . Lactulose 20 GM/30ML SOLN Take 30 mLs (20 g total) by mouth every 4 (four) hours as needed. Until  constipation is relieved.  . polyethylene glycol-electrolytes (NULYTELY/GOLYTELY) 420 G solution Drink 8 ounces of solution every 8 hours until constipation is relieved     Orders Placed This Encounter  Procedures  . Urine Culture  . Clostridium difficile EIA  . DG Abd 2 Views  . Comprehensive metabolic panel  . CBC with Differential  . Lipase  . Urinalysis, Routine w reflex microscopic  . POCT urinalysis dipstick    No Follow-up on file.

## 2014-07-03 LAB — LACTATE DEHYDROGENASE: LDH: 152 U/L (ref 94–250)

## 2014-07-03 LAB — RHEUMATOID FACTOR: Rhuematoid fact SerPl-aCnc: 38 IU/mL — ABNORMAL HIGH (ref <=14)

## 2014-07-03 LAB — ANA W/REFLEX IF POSITIVE: Anti Nuclear Antibody(ANA): NEGATIVE

## 2014-07-04 ENCOUNTER — Encounter: Payer: Self-pay | Admitting: Internal Medicine

## 2014-07-04 DIAGNOSIS — M130 Polyarthritis, unspecified: Secondary | ICD-10-CM

## 2014-07-04 DIAGNOSIS — M5440 Lumbago with sciatica, unspecified side: Secondary | ICD-10-CM

## 2014-07-04 NOTE — Assessment & Plan Note (Signed)
There is no evidence of UTI on today's UA and 9 days of Septra is over treatment. Plain suggest constipation as cause,  With no signs of partial obstruction.  Will treat with lactulose, given her chronic constipation and rule out c dificile although this is less likely,    If  There is no improvement in 2-3 days , discussed that a CT of abd and pelvis  may be necessary to rule out diverticulitis. Lab Results  Component Value Date   WBC 3.7* 07/02/2014   HGB 13.1 07/02/2014   HCT 40.5 07/02/2014   MCV 86.5 07/02/2014   PLT 129.0* 07/02/2014

## 2014-07-05 ENCOUNTER — Other Ambulatory Visit: Payer: Self-pay | Admitting: Internal Medicine

## 2014-07-05 NOTE — Addendum Note (Signed)
Addended by: Karlene Einstein D on: 07/05/2014 08:34 AM   Modules accepted: Orders

## 2014-07-06 LAB — C. DIFFICILE GDH AND TOXIN A/B
C. DIFF TOXIN A/B: NOT DETECTED
C. difficile GDH: NOT DETECTED

## 2014-07-06 LAB — URINE CULTURE: Colony Count: 70000

## 2014-07-07 ENCOUNTER — Ambulatory Visit (INDEPENDENT_AMBULATORY_CARE_PROVIDER_SITE_OTHER): Payer: 59 | Admitting: *Deleted

## 2014-07-07 ENCOUNTER — Telehealth: Payer: Self-pay | Admitting: Cardiology

## 2014-07-07 ENCOUNTER — Encounter: Payer: Self-pay | Admitting: Internal Medicine

## 2014-07-07 DIAGNOSIS — I493 Ventricular premature depolarization: Secondary | ICD-10-CM

## 2014-07-07 DIAGNOSIS — I469 Cardiac arrest, cause unspecified: Secondary | ICD-10-CM

## 2014-07-07 NOTE — Telephone Encounter (Signed)
Spoke with pt and reminded pt of remote transmission that is due today. Pt verbalized understanding.   

## 2014-07-08 LAB — MDC_IDC_ENUM_SESS_TYPE_REMOTE
Battery Voltage: 3.13 V
Brady Statistic AP VP Percent: 0.02 %
Brady Statistic AP VS Percent: 61.35 %
Brady Statistic AS VP Percent: 0.03 %
Brady Statistic RA Percent Paced: 61.36 %
Brady Statistic RV Percent Paced: 0.04 %
Date Time Interrogation Session: 20151105012551
HIGH POWER IMPEDANCE MEASURED VALUE: 54 Ohm
HIGH POWER IMPEDANCE MEASURED VALUE: 80 Ohm
HighPow Impedance: 456 Ohm
Lead Channel Impedance Value: 456 Ohm
Lead Channel Impedance Value: 494 Ohm
Lead Channel Pacing Threshold Amplitude: 0.5 V
Lead Channel Pacing Threshold Amplitude: 0.875 V
Lead Channel Pacing Threshold Pulse Width: 0.4 ms
Lead Channel Pacing Threshold Pulse Width: 0.4 ms
Lead Channel Sensing Intrinsic Amplitude: 10.125 mV
Lead Channel Sensing Intrinsic Amplitude: 10.125 mV
Lead Channel Sensing Intrinsic Amplitude: 2.625 mV
Lead Channel Sensing Intrinsic Amplitude: 2.625 mV
Lead Channel Setting Pacing Amplitude: 2.5 V
Lead Channel Setting Pacing Pulse Width: 0.4 ms
Lead Channel Setting Sensing Sensitivity: 0.45 mV
MDC IDC SET LEADCHNL RA PACING AMPLITUDE: 2 V
MDC IDC STAT BRADY AS VS PERCENT: 38.61 %
Zone Setting Detection Interval: 300 ms
Zone Setting Detection Interval: 350 ms
Zone Setting Detection Interval: 360 ms
Zone Setting Detection Interval: 450 ms

## 2014-07-08 NOTE — Progress Notes (Signed)
Remote ICD transmission.   

## 2014-07-19 ENCOUNTER — Ambulatory Visit: Payer: 59 | Admitting: Internal Medicine

## 2014-07-28 ENCOUNTER — Encounter: Payer: Self-pay | Admitting: Internal Medicine

## 2014-08-04 ENCOUNTER — Encounter: Payer: Self-pay | Admitting: Cardiology

## 2014-08-09 ENCOUNTER — Encounter: Payer: Self-pay | Admitting: Internal Medicine

## 2014-09-13 ENCOUNTER — Other Ambulatory Visit: Payer: Self-pay | Admitting: Internal Medicine

## 2014-10-07 ENCOUNTER — Ambulatory Visit (INDEPENDENT_AMBULATORY_CARE_PROVIDER_SITE_OTHER): Payer: 59 | Admitting: *Deleted

## 2014-10-07 DIAGNOSIS — I469 Cardiac arrest, cause unspecified: Secondary | ICD-10-CM

## 2014-10-07 NOTE — Progress Notes (Signed)
Remote ICD transmission.   

## 2014-10-12 ENCOUNTER — Ambulatory Visit (INDEPENDENT_AMBULATORY_CARE_PROVIDER_SITE_OTHER): Payer: 59 | Admitting: Nurse Practitioner

## 2014-10-12 ENCOUNTER — Encounter: Payer: Self-pay | Admitting: Nurse Practitioner

## 2014-10-12 VITALS — BP 120/70 | HR 64 | Temp 97.5°F | Resp 12 | Ht 67.0 in | Wt 143.0 lb

## 2014-10-12 DIAGNOSIS — R1011 Right upper quadrant pain: Secondary | ICD-10-CM

## 2014-10-12 NOTE — Assessment & Plan Note (Signed)
Worsening. Pt having frequent episodes since Dec. 2015. We discussed options and the patient was okay with obtaining an updated [place] of abdomen. We will follow up in 3 weeks. May refer to GI if no findings, will refer to general surgery if findings.

## 2014-10-12 NOTE — Progress Notes (Signed)
Pre visit review using our clinic review tool, if applicable. No additional management support is needed unless otherwise documented below in the visit note. 

## 2014-10-12 NOTE — Progress Notes (Signed)
Subjective:    Patient ID: Nicole Swanson, female    DOB: 1956-07-10, 59 y.o.   MRN: [id number]  HPI  Nicole Swanson is a 59 yo female with a CC of gallbladder problems and severe stomach pain last episode 5 days ago.   1) Gallbladder issues for years she reports.  Since New Years eve 3 episodes- episodes are similar- pain and throwing up.  Cuts out fats/dairy and seems to help 30 years ago [place] of abdomen found gallbladder not functioning well. Nothing was done after this.  Last Thursday night (5 days ago) last episode-  Baked potato for lunch with salt and pepper only  Eating makes pt nauseated, little appetite  5:30 pm churning and rolling, N/V lasted most of the night, couldn't lay down, severe pain- located RUQ to back under rib cage and and Bilateral lower abdomen, no shoulder pain, bloating, gas, Lasted all night- only 1-2 hours of rest, Sharp pain went away quickly when it did go away   Soreness persisted and could not work the next day   Fri. Nausea no Vomiting Last abd x-ray 07/02/14- constipation   Apple sauce, oat bran, and prune juice mix at night time- helpful  Miralax- intermittently  Nulytely- Intermittently  Dexilant- every day  Lactulose- not helpful  Colace- helpful   OB/GYN annual visit in Dec. No issues found    Review of Systems  Constitutional: Positive for appetite change. Negative for fever, chills, diaphoresis, fatigue and unexpected weight change.  Eyes: Negative for visual disturbance.  Respiratory: Negative for chest tightness, shortness of breath and wheezing.   Cardiovascular: Negative for chest pain, palpitations and leg swelling.  Gastrointestinal: Positive for nausea, vomiting, abdominal pain, diarrhea, constipation and abdominal distention. Negative for blood in stool, anal bleeding and rectal pain.       1 episode of diarrhea Constipation intermittently  Genitourinary: Negative for dysuria, flank pain, pelvic pain and dyspareunia.    Musculoskeletal: Positive for myalgias.       Plaquenil helpful  Skin: Negative for rash.  Neurological: Negative for dizziness, weakness and headaches.  Hematological: Does not bruise/bleed easily.   Past Medical History  Diagnosis Date  . Aborted cardiac arrest 1994  . DVT (deep vein thrombosis) in pregnancy 1986    had heparin shots followed by coumadin  . Dual implantable cardiac defibrillator     Medtronic initially implanted in 1997  . 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 Irritable bowel syndrome   . PVC's (premature ventricular contractions)   . Ventricular tachycardia   . Family history of seizures     Question neurological versus cardiac    History   Social History  . Marital Status: Widowed    Spouse Name: N/A    Number of Children: N/A  . Years of Education: N/A   Occupational History  . Not on file.   Social History Main Topics  . Smoking status: Former Smoker    Types: Cigarettes    Quit date: 06/04/1995  . Smokeless tobacco: Never Used  . Alcohol Use: Yes     Comment: occasional  . Drug Use: No  . Sexual Activity: Not on file   Other Topics Concern  . Not on file   Social History Narrative    Past Surgical History  Procedure Laterality Date  . Cardiac defibrillator placement  1994    Dual chamber with pacemaker  . Nasal septum surgery  2006    [name]  Family History  Problem Relation Age of Onset  . Heart disease Mother 45    A Fib, CHF  . Stroke Mother   . Heart disease Father   . Stroke Father   . Seizures Brother   . Seizures Daughter     Allergies  Allergen Reactions  . Sulfa Antibiotics Diarrhea  . Penicillins Rash  . Vancomycin Rash    Current Outpatient Prescriptions on File Prior to Visit  Medication Sig Dispense Refill  . aspirin 81 MG tablet Take 81 mg by mouth daily.      . Cetirizine HCl 10 MG CAPS Take 10 mg by mouth daily.    Marland Kitchen DEXILANT 60 MG capsule Take 1 capsule (60 mg total) by  mouth daily. 30 capsule 5  . docusate sodium (COLACE) 250 MG capsule Take 250 mg by mouth 2 (two) times daily.    . fluticasone (FLONASE) 50 MCG/ACT nasal spray Place 1 spray into both nostrils daily. 16 g 5  . Lactulose 20 GM/30ML SOLN Take 30 mLs (20 g total) by mouth every 4 (four) hours as needed. Until constipation is relieved. 240 mL 1  . montelukast (SINGULAIR) 10 MG tablet TAKE 1 TABLET BY MOUTH AT BEDTIME. 90 tablet 1  . polyethylene glycol-electrolytes (NULYTELY/GOLYTELY) 420 G solution Drink 8 ounces of solution every 8 hours until constipation is relieved 4000 mL 0  . sotalol (BETAPACE) 120 MG tablet Take 1 tablet (120 mg total) by mouth 2 (two) times daily. 180 tablet 3  . traMADol (ULTRAM) 50 MG tablet Take 1 tablet (50 mg total) by mouth every 8 (eight) hours as needed. 90 tablet 2   No current facility-administered medications on file prior to visit.       Objective:   Physical Exam  Constitutional: She is oriented to person, place, and time. She appears well-developed and well-nourished. No distress.  BP 120/70 mmHg  Pulse 64  Temp(Src) 97.5 F (36.4 C)  Resp 12  Ht 5\' 7"  (1.702 m)  Wt 143 lb (64.864 kg)  BMI 22.39 kg/m2  SpO2 97%   HENT:  Head: Normocephalic and atraumatic.  Right Ear: External ear normal.  Left Ear: External ear normal.  Eyes: Right eye exhibits no discharge. Left eye exhibits no discharge. No scleral icterus.  Cardiovascular: Normal rate, regular rhythm, normal heart sounds and intact distal pulses.  Exam reveals no gallop and no friction rub.   No murmur heard. Pulmonary/Chest: Effort normal and breath sounds normal. No respiratory distress. She has no wheezes. She has no rales. She exhibits no tenderness.  Abdominal: Soft. She exhibits no shifting dullness, no distension, no pulsatile liver, no abdominal bruit, no pulsatile midline mass and no mass. Bowel sounds are increased. There is no hepatosplenomegaly, splenomegaly or hepatomegaly. There  is tenderness in the epigastric area. There is no rigidity, no rebound, no guarding, no CVA tenderness, no tenderness at McBurney's point and negative Murphy's sign. No hernia. Hernia confirmed negative in the ventral area.  Tender epigastric area and lower abdominal bilaterally. She stated it was sore when I palpated.   Genitourinary:  Annual done by GYN in dec.   Neurological: She is alert and oriented to person, place, and time. No cranial nerve deficit. She exhibits normal muscle tone. Coordination normal.  Skin: Skin is warm and dry. No rash noted. She is not diaphoretic.  Psychiatric: She has a normal mood and affect. Her behavior is normal. Judgment and thought content normal.      Assessment &  Plan:

## 2014-10-12 NOTE — Patient Instructions (Addendum)
We will follow up in 3 weeks.   We will contact you about your appointment for abdominal ultrasound.

## 2014-10-15 ENCOUNTER — Ambulatory Visit: Payer: Self-pay | Admitting: Nurse Practitioner

## 2014-10-15 LAB — MDC_IDC_ENUM_SESS_TYPE_REMOTE
Brady Statistic AP VP Percent: 0.02 %
Brady Statistic AP VS Percent: 69.01 %
Brady Statistic AS VP Percent: 0.02 %
Brady Statistic RA Percent Paced: 69.03 %
Date Time Interrogation Session: 20160204133722
HighPow Impedance: 456 Ohm
HighPow Impedance: 47 Ohm
HighPow Impedance: 62 Ohm
Lead Channel Impedance Value: 494 Ohm
Lead Channel Pacing Threshold Amplitude: 0.875 V
Lead Channel Pacing Threshold Pulse Width: 0.4 ms
Lead Channel Sensing Intrinsic Amplitude: 2.25 mV
Lead Channel Sensing Intrinsic Amplitude: 8.375 mV
Lead Channel Sensing Intrinsic Amplitude: 8.375 mV
Lead Channel Setting Pacing Amplitude: 2 V
Lead Channel Setting Pacing Amplitude: 2.5 V
Lead Channel Setting Pacing Pulse Width: 0.4 ms
Lead Channel Setting Sensing Sensitivity: 0.45 mV
MDC IDC MSMT BATTERY VOLTAGE: 3.1 V
MDC IDC MSMT LEADCHNL RA IMPEDANCE VALUE: 437 Ohm
MDC IDC MSMT LEADCHNL RA PACING THRESHOLD AMPLITUDE: 0.625 V
MDC IDC MSMT LEADCHNL RA SENSING INTR AMPL: 2.25 mV
MDC IDC MSMT LEADCHNL RV PACING THRESHOLD PULSEWIDTH: 0.4 ms
MDC IDC STAT BRADY AS VS PERCENT: 30.95 %
MDC IDC STAT BRADY RV PERCENT PACED: 0.04 %
Zone Setting Detection Interval: 300 ms
Zone Setting Detection Interval: 350 ms
Zone Setting Detection Interval: 360 ms
Zone Setting Detection Interval: 450 ms

## 2014-10-22 ENCOUNTER — Telehealth: Payer: Self-pay | Admitting: Internal Medicine

## 2014-10-22 NOTE — Telephone Encounter (Signed)
Patient called for results of ultra sound performed on 10/15/14, I have faxed for results incase they were not available.

## 2014-10-22 NOTE — Telephone Encounter (Signed)
Patient notified and voiced understanding, patient has ask to wait on referral to see if she has another episode then if another episode occur she will call for referral.

## 2014-10-22 NOTE — Telephone Encounter (Signed)
Normal Ultrasound of abdomen- no gallbladder issues, kidney, pancreas, liver, or abdominal aneurysms (none of any of those found). Let her know. We can refer her to GI if she would like to be worked up further. Thanks!

## 2014-11-01 ENCOUNTER — Encounter: Payer: Self-pay | Admitting: Cardiology

## 2014-11-05 ENCOUNTER — Other Ambulatory Visit: Payer: Self-pay | Admitting: Internal Medicine

## 2014-11-08 ENCOUNTER — Ambulatory Visit: Payer: 59 | Admitting: Internal Medicine

## 2014-11-08 ENCOUNTER — Encounter: Payer: Self-pay | Admitting: Internal Medicine

## 2014-11-15 ENCOUNTER — Encounter: Payer: Self-pay | Admitting: Cardiology

## 2014-12-02 ENCOUNTER — Other Ambulatory Visit: Payer: Self-pay | Admitting: Internal Medicine

## 2014-12-08 ENCOUNTER — Encounter: Payer: Self-pay | Admitting: Internal Medicine

## 2014-12-08 ENCOUNTER — Ambulatory Visit (INDEPENDENT_AMBULATORY_CARE_PROVIDER_SITE_OTHER): Payer: 59 | Admitting: Internal Medicine

## 2014-12-08 VITALS — BP 118/64 | HR 64 | Temp 98.1°F | Resp 16 | Ht 67.0 in | Wt 145.0 lb

## 2014-12-08 DIAGNOSIS — M4806 Spinal stenosis, lumbar region: Secondary | ICD-10-CM | POA: Diagnosis not present

## 2014-12-08 DIAGNOSIS — M5126 Other intervertebral disc displacement, lumbar region: Secondary | ICD-10-CM

## 2014-12-08 DIAGNOSIS — M48062 Spinal stenosis, lumbar region with neurogenic claudication: Secondary | ICD-10-CM

## 2014-12-08 MED ORDER — ONDANSETRON HCL 8 MG PO TABS: 8.0000 mg | ORAL_TABLET | Freq: Three times a day (TID) | ORAL | Status: DC | PRN

## 2014-12-08 MED ORDER — TRAMADOL HCL 50 MG PO TABS: 50.0000 mg | ORAL_TABLET | Freq: Three times a day (TID) | ORAL | Status: DC | PRN

## 2014-12-08 MED ORDER — DIFLUNISAL 500 MG PO TABS: 500.0000 mg | ORAL_TABLET | Freq: Two times a day (BID) | ORAL | Status: DC

## 2014-12-08 MED ORDER — TRAMADOL HCL 50 MG PO TABS: 100.0000 mg | ORAL_TABLET | Freq: Four times a day (QID) | ORAL | Status: DC | PRN

## 2014-12-08 NOTE — Progress Notes (Signed)
Patient ID: Nicole Swanson, female   DOB: 08-26-1956, 59 y.o.   MRN: [id number]  Patient Active Problem List   Diagnosis Date Noted  . Abdominal pain, right upper quadrant 10/12/2014  . Abdominal pain, left lower quadrant 07/02/2014  . Polyarthritis 03/16/2014  . Thrombocytopenia, unspecified 09/11/2013  . Other malaise and fatigue 09/10/2013  . Major depressive disorder, single episode 09/10/2013  . Raynaud phenomenon 04/17/2013  . Low back pain 12/05/2012  . Shortness of breath 06/25/2012  . Aborted cardiac arrest   . ICD-Medtronic   . PVC's / ventricular tachycardia   . 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 Irritable bowel syndrome     Subjective:  CC:   Chief Complaint  Patient presents with  . Back Pain    Continues to have back pain that now is affecting all aspects of quality of life in all aspects.    HPI:   Nicole Swanson is a 59 y.o. female who presents for persistent low back pain radiating to both legs, worse on the left,  All the way to foot.  Having some left foot drop,  Taking tramadol at night and nabumetone during the day,  No relief. There was no evidence of spinal stenosis by Last CT spine 2012 (has pacer) but multilevel degenerative changes and facet hypertrophy was noted.  More recently.  Interval Loss of vertebral space between L2-L3 noted on July 2015 x rays .  She has tried PT,  Chiropractic manipulation with no improvement. Not sleeping well due to pain,.  Has not tried taking 100 mg tramadol at bedtime and does not take it during the day.  History of nausea, severe with Vicodin and percocet. Took diflunisal recently for oral surgery and though  It was more effective than Relafen but it made her dizzy.     Past Medical History  Diagnosis Date  . Aborted cardiac arrest 1994  . DVT (deep vein thrombosis) in pregnancy 1986    had heparin shots followed by coumadin  . Dual implantable cardiac defibrillator     Medtronic initially implanted in  1997  . 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 Irritable bowel syndrome   . PVC's (premature ventricular contractions)   . Ventricular tachycardia   . Family history of seizures     Question neurological versus cardiac    Past Surgical History  Procedure Laterality Date  . Cardiac defibrillator placement  1994    Dual chamber with pacemaker  . Nasal septum surgery  2006    [name]        The following portions of the patient's history were reviewed and updated as appropriate: Allergies, current medications, and problem list.    Review of Systems:   Patient denies headache, fevers, malaise, unintentional weight loss, skin rash, eye pain, sinus congestion and sinus pain, sore throat, dysphagia,  hemoptysis , cough, dyspnea, wheezing, chest pain, palpitations, orthopnea, edema, abdominal pain, nausea, melena, diarrhea, constipation, flank pain, dysuria, hematuria, urinary  Frequency, nocturia, numbness, tingling, seizures,  Focal weakness, Loss of consciousness,  Tremor, insomnia, depression, anxiety, and suicidal ideation.     History   Social History  . Marital Status: Widowed    Spouse Name: N/A  . Number of Children: N/A  . Years of Education: N/A   Occupational History  . Not on file.   Social History Main Topics  . Smoking status: Former Smoker    Types: Cigarettes    Quit  date: 06/04/1995  . Smokeless tobacco: Never Used  . Alcohol Use: Yes     Comment: occasional  . Drug Use: No  . Sexual Activity: Not on file   Other Topics Concern  . Not on file   Social History Narrative    Objective:  Filed Vitals:   12/08/14 0936  BP: 118/64  Pulse: 64  Temp: 98.1 F (36.7 C)  Resp: 16     General appearance: alert, cooperative and appears stated age Ears: normal TM's and external ear canals both ears Throat: lips, mucosa, and tongue normal; teeth and gums normal Neck: no adenopathy, no carotid bruit, supple, symmetrical, trachea  midline and thyroid not enlarged, symmetric, no tenderness/mass/nodules Back: symmetric, no curvature. ROM normal. No CVA tenderness. Lungs: clear to auscultation bilaterally Heart: regular rate and rhythm, S1, S2 normal, no murmur, click, rub or gallop Abdomen: soft, non-tender; bowel sounds normal; no masses,  no organomegaly Pulses: 2+ and symmetric Skin: Skin color, texture, turgor normal. No rashes or lesions Lymph nodes: Cervical, supraclavicular, and axillary nodes normal.  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 He is having persistent pain .  Analgesic regimen outlined,  Referral to Neurosurgery.  Repeat CT ordered.    Updated Medication List Outpatient Encounter Prescriptions as of 12/08/2014  Medication Sig  . aspirin 81 MG tablet Take 81 mg by mouth daily.    . Calcium Carb-Cholecalciferol [phone] MG-UNIT CAPS Take by mouth.  . Cetirizine HCl 10 MG CAPS Take 10 mg by mouth daily.  . Cranberry (THERACRAN PO) Take by mouth.  . DEXILANT 60 MG capsule TAKE 1 CAPSULE BY MOUTH DAILY.  Marland Kitchen docusate sodium (COLACE) 250 MG capsule Take 250 mg by mouth 2 (two) times daily.  . fluticasone (FLONASE) 50 MCG/ACT nasal spray Place 1 spray into both nostrils daily.  . hydroxychloroquine (PLAQUENIL) 200 MG tablet Take 200 mg by mouth daily.  . Lactulose 20 GM/30ML SOLN Take 30 mLs (20 g total) by mouth every 4 (four) hours as needed. Until constipation is relieved.  . Misc Natural Products (OSTEO BI-FLEX TRIPLE STRENGTH PO) Take by mouth.  . montelukast (SINGULAIR) 10 MG tablet TAKE 1 TABLET BY MOUTH AT BEDTIME.  . polyethylene glycol-electrolytes (NULYTELY/GOLYTELY) 420 G solution Drink 8 ounces of solution every 8 hours until constipation is relieved  . sotalol (BETAPACE) 120 MG tablet Take 1 tablet (120 mg total) by mouth 2 (two) times daily.  . traMADol (ULTRAM) 50 MG tablet Take 2 tablets (100 mg  total) by mouth every 6 (six) hours as needed.  . vitamin C (ASCORBIC ACID) 500 MG tablet Take 500 mg by mouth 2 (two) times daily.  . [DISCONTINUED] traMADol (ULTRAM) 50 MG tablet Take 1 tablet (50 mg total) by mouth every 8 (eight) hours as needed.  . [DISCONTINUED] traMADol (ULTRAM) 50 MG tablet Take 1 tablet (50 mg total) by mouth every 8 (eight) hours as needed.  . diflunisal (DOLOBID) 500 MG TABS tablet Take 1 tablet (500 mg total) by mouth 2 (two) times daily.  . ondansetron (ZOFRAN) 8 MG tablet Take 1 tablet (8 mg total) by mouth every 8 (eight) hours as needed for nausea or vomiting.     Orders Placed This Encounter  Procedures  . CT Lumbar Spine Wo Contrast    No Follow-up on file.

## 2014-12-08 NOTE — Progress Notes (Signed)
Pre-visit discussion using our clinic review tool. No additional management support is needed unless otherwise documented below in the visit note.  

## 2014-12-08 NOTE — Patient Instructions (Addendum)
Try taking  tramadol at 8 am . 12, pm  4 pm  And 2 at 9 pm with either diflunisal or nabumetone   You can adjust the tramadol   dose as needed , up to 6 tablets daily   CT of lumbar spine and Neurosurgical consult   You can take up to 4 tylenol daily as well,  With any combination

## 2014-12-11 ENCOUNTER — Encounter: Payer: Self-pay | Admitting: Internal Medicine

## 2014-12-11 NOTE — Assessment & Plan Note (Addendum)
By lumbar CT previously,  given contraindication to MRI. He is having persistent pain .  Analgesic regimen outlined,  Referral to Neurosurgery.  Repeat CT ordered.

## 2014-12-15 ENCOUNTER — Telehealth: Payer: Self-pay | Admitting: Internal Medicine

## 2014-12-15 NOTE — Telephone Encounter (Signed)
Patient called to check on CT referral, Please advise. Patient ask if insurance has approved.

## 2014-12-15 NOTE — Telephone Encounter (Signed)
Nicole Swanson has this been done yet?  If not can you work on it tomorrow?  thanks

## 2014-12-16 NOTE — Telephone Encounter (Signed)
Patient notified and voiced understanding.

## 2014-12-16 NOTE — Telephone Encounter (Signed)
Nicole Swanson see prior message,  Thanks.  [name] tell her we are working on it

## 2014-12-17 ENCOUNTER — Ambulatory Visit
Admit: 2014-12-17 | Disposition: A | Discharge status: Home / Self Care | Payer: Self-pay | Attending: Internal Medicine | Admitting: Internal Medicine

## 2014-12-21 ENCOUNTER — Telehealth: Payer: Self-pay | Admitting: Internal Medicine

## 2014-12-21 DIAGNOSIS — R1011 Right upper quadrant pain: Secondary | ICD-10-CM

## 2014-12-21 DIAGNOSIS — M5126 Other intervertebral disc displacement, lumbar region: Secondary | ICD-10-CM

## 2014-12-21 DIAGNOSIS — K8020 Calculus of gallbladder without cholecystitis without obstruction: Secondary | ICD-10-CM

## 2014-12-21 NOTE — Telephone Encounter (Signed)
Ct results sent via my chart

## 2014-12-21 NOTE — Op Note (Signed)
PATIENT NAME:  Nicole Swanson, Nicole Swanson MR#:  [id number] DATE OF BIRTH:  [birth year]  DATE OF PROCEDURE:  03/24/2012  PREOPERATIVE DIAGNOSIS: Previously implanted ICD; aborted cardiac arrest sinus node dysfunction with previously implanted atrial lead.   POSTOPERATIVE DIAGNOSIS: Previously implanted ICD; aborted cardiac arrest sinus node dysfunction with previously implanted atrial lead.   PROCEDURES:  1. High voltage testing. 2. Defibrillator generator explantation. 3. Defibrillator generator implantation. 4. High voltage defibrillation threshold testing.   SURGEON: Deboraha Sprang, MD   PROCEDURE: On obtaining informed consent, the patient was brought to the operating room and placed on the fluoroscopic table in the supine position. Prior to routine prep and drape, high voltage testing was undertaken to assure the integrity of the high voltage circuit which was 61 years old. A 5 joule shock was delivered through a measured resistance of 51 ohms.   At this point the patient was prepped and draped and thereafter lidocaine was infiltrated just caudal to the line of the previous incision and carried down to the layer of the device pocket using sharp dissection and electrocautery. The pocket was opened. The device was explanted.   Interrogation of the previously implanted Medtronic [phone] lead serial [id number] demonstrated an amplitude of 8.5 with paced beats at 593 and threshold 0.6 and 0.5.   The previously implanted atrial lead Medtronic 5076 serial [id number] demonstrated an amplitude of 3.6 with a paced impedance of 520, threshold of 0.5 and 0.5. Proximal coil and paced distal coil pacing impedances were also in the mid 500 range.   With these acceptable parameters recorded, the leads were attached to a Medtronic [id number], serial #[id number] through the device, bipolar P wave 2.6 with a paced impedance of 437 and threshold volt at 0.4. The R wave was 8.4. The paced impedance was 399 with threshold 0.75  and 0.4. Proximal coil impedance was 65. Distal coil impedance was 53.   Defibrillation threshold testing was then undertaken. Ventricular fibrillation was induced via the T wave shock after a total duration of 9 seconds following a little bit of dropout a 20 joule shock was delivered through a measured resistance of 55 ohms terminating ventricular fibrillation and restoring an AV paced rhythm.   The pocket was copiously irrigated with antibiotic saline solution. Hemostasis was assured. The leads and the pulse generator were placed in the pocket and secured to the prepectoral fascia. The wound was then closed in three layers in the normal fashion. The wound was washed, dried, and a benzoin and Steri-Strip dressing was applied. Needle counts, sponge counts, and instrument counts were correct at the end of the procedure according to the staff. The patient tolerated the procedure without apparent complication.   ____________________________ Deboraha Sprang, MD sck:drc D: 03/24/2012 08:45:48 ET T: 03/24/2012 09:05:22 ET JOB#: [id number]  cc: Deboraha Sprang, MD, <Dictator> Deboraha Sprang MD ELECTRONICALLY SIGNED 04/18/2012 8:41

## 2014-12-22 NOTE — Addendum Note (Signed)
Addended by: [name] on: 12/22/2014 05:23 PM   Modules accepted: Orders

## 2014-12-22 NOTE — Telephone Encounter (Signed)
My Chart message sent

## 2014-12-23 DIAGNOSIS — K8020 Calculus of gallbladder without cholecystitis without obstruction: Secondary | ICD-10-CM | POA: Insufficient documentation

## 2014-12-23 NOTE — Addendum Note (Signed)
Addended by: [name] on: 12/23/2014 12:39 PM   Modules accepted: Orders

## 2014-12-23 NOTE — Telephone Encounter (Signed)
Patient would like HIDA scan .

## 2015-01-04 ENCOUNTER — Encounter: Payer: Self-pay | Admitting: Internal Medicine

## 2015-01-04 ENCOUNTER — Ambulatory Visit (INDEPENDENT_AMBULATORY_CARE_PROVIDER_SITE_OTHER): Payer: 59 | Admitting: Internal Medicine

## 2015-01-04 VITALS — BP 110/70 | HR 80 | Ht 67.0 in | Wt 143.2 lb

## 2015-01-04 DIAGNOSIS — Z95810 Presence of automatic (implantable) cardiac defibrillator: Secondary | ICD-10-CM

## 2015-01-04 DIAGNOSIS — I469 Cardiac arrest, cause unspecified: Secondary | ICD-10-CM

## 2015-01-04 DIAGNOSIS — I4892 Unspecified atrial flutter: Secondary | ICD-10-CM

## 2015-01-04 LAB — CUP PACEART INCLINIC DEVICE CHECK
Battery Voltage: 3.12 V
Brady Statistic AP VP Percent: 0.02 %
Brady Statistic AP VS Percent: 64.97 %
Brady Statistic AS VP Percent: 0.03 %
Brady Statistic AS VS Percent: 34.99 %
Brady Statistic RA Percent Paced: 64.98 %
Brady Statistic RV Percent Paced: 0.05 %
Date Time Interrogation Session: 20160503134133
HIGH POWER IMPEDANCE MEASURED VALUE: 494 Ohm
HIGH POWER IMPEDANCE MEASURED VALUE: 51 Ohm
HIGH POWER IMPEDANCE MEASURED VALUE: 76 Ohm
Lead Channel Impedance Value: 494 Ohm
Lead Channel Impedance Value: 513 Ohm
Lead Channel Pacing Threshold Amplitude: 0.5 V
Lead Channel Pacing Threshold Amplitude: 0.625 V
Lead Channel Pacing Threshold Pulse Width: 0.4 ms
Lead Channel Pacing Threshold Pulse Width: 0.4 ms
Lead Channel Sensing Intrinsic Amplitude: 2.125 mV
Lead Channel Sensing Intrinsic Amplitude: 2.125 mV
Lead Channel Sensing Intrinsic Amplitude: 8.75 mV
Lead Channel Setting Pacing Amplitude: 2 V
Lead Channel Setting Pacing Amplitude: 2.5 V
Lead Channel Setting Pacing Pulse Width: 0.4 ms
Lead Channel Setting Sensing Sensitivity: 0.45 mV
MDC IDC MSMT LEADCHNL RV SENSING INTR AMPL: 8.75 mV
Zone Setting Detection Interval: 300 ms
Zone Setting Detection Interval: 350 ms
Zone Setting Detection Interval: 360 ms
Zone Setting Detection Interval: 450 ms

## 2015-01-04 NOTE — Patient Instructions (Signed)
Medication Instructions: - no changes  Labwork: - Your physician recommends that you have lab work today: bmp/ magnesium  Procedures/Testing: - none  Follow-Up: - Remote monitoring is used to monitor your Pacemaker of ICD from home. This monitoring reduces the number of office visits required to check your device to one time per year. It allows [place] to keep an eye on the functioning of your device to ensure it is working properly. You are scheduled for a device check from home on 04/05/15. You may send your transmission at any time that day. If you have a wireless device, the transmission will be sent automatically. After your physician reviews your transmission, you will receive a postcard with your next transmission date.  - Your physician wants you to follow-up in: 1 year with Dr. [name]. You will receive a reminder letter in the mail two months in advance. If you don't receive a letter, please call our office to schedule the follow-up appointment.   Any Additional Special Instructions Will Be Listed Below (If Applicable). - none

## 2015-01-04 NOTE — Progress Notes (Signed)
Patient Care Team: [name], MD as PCP - General (Internal Medicine)   HPI  Nicole Swanson is a 59 y.o. female Seen in followup for ICD implanted for aborted cardiac arrest. Her care initially had been at [facility]. She is status post dual chamber ICD implantation and generator replacement was recently done by me summer 2013   Echocardiogram 2011 demonstrated normal left ventricular function valve function and dimension sizes   She also has a history of ventricular ectopy for which she has been on sotalol in which Dr. Sharon Seller was going to increase prior to her transferring her care to [facility]   Her husband  Died 2015 .    She denies problems with exercise tolerance at this point; at her last visit we activated/augmented rate response on her device  There is no edema or chest pain.  She has some palpitations but nothing sustained.  Last laboratories were magnesium  4/15-2.4 and potassium 10/15  -4.5 and 0.9 Cr  Past Medical History  Diagnosis Date  . Aborted cardiac arrest 1994  . DVT (deep vein thrombosis) in pregnancy 1986    had heparin shots followed by coumadin  . Dual implantable cardiac defibrillator     Medtronic initially implanted in 1997  . 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 Irritable bowel syndrome   . PVC's (premature ventricular contractions)   . Ventricular tachycardia   . Family history of seizures     Question neurological versus cardiac    Past Surgical History  Procedure Laterality Date  . Cardiac defibrillator placement  1994    Dual chamber with pacemaker  . Nasal septum surgery  2006    Nicole Swanson     Current Outpatient Prescriptions  Medication Sig Dispense Refill  . aspirin 81 MG tablet Take 81 mg by mouth daily.      . Calcium Carb-Cholecalciferol [phone] MG-UNIT CAPS Take by mouth.    . Cetirizine HCl 10 MG CAPS Take 10 mg by mouth daily.    . Cranberry (THERACRAN PO) Take by mouth.    . DEXILANT 60  MG capsule TAKE 1 CAPSULE BY MOUTH DAILY. 30 capsule 5  . diflunisal (DOLOBID) 500 MG TABS tablet Take 1 tablet (500 mg total) by mouth 2 (two) times daily. 60 tablet 0  . docusate sodium (COLACE) 250 MG capsule Take 250 mg by mouth 2 (two) times daily.    . fluticasone (FLONASE) 50 MCG/ACT nasal spray Place 1 spray into both nostrils daily. 16 g 5  . Lactulose 20 GM/30ML SOLN Take 30 mLs (20 g total) by mouth every 4 (four) hours as needed. Until constipation is relieved. 240 mL 1  . Misc Natural Products (OSTEO BI-FLEX TRIPLE STRENGTH PO) Take by mouth.    . montelukast (SINGULAIR) 10 MG tablet TAKE 1 TABLET BY MOUTH AT BEDTIME. 90 tablet 1  . ondansetron (ZOFRAN) 8 MG tablet Take 1 tablet (8 mg total) by mouth every 8 (eight) hours as needed for nausea or vomiting. 20 tablet 0  . polyethylene glycol-electrolytes (NULYTELY/GOLYTELY) 420 G solution Drink 8 ounces of solution every 8 hours until constipation is relieved 4000 mL 0  . sotalol (BETAPACE) 120 MG tablet Take 1 tablet (120 mg total) by mouth 2 (two) times daily. 180 tablet 3  . traMADol (ULTRAM) 50 MG tablet Take 2 tablets (100 mg total) by mouth every 6 (six) hours as needed. 180 tablet 2  . vitamin C (ASCORBIC ACID)  500 MG tablet Take 500 mg by mouth 2 (two) times daily.     No current facility-administered medications for this visit.    Allergies  Allergen Reactions  . Sulfa Antibiotics Diarrhea  . Penicillins Rash  . Vancomycin Rash    Review of Systems negative except from HPI and PMH  Physical Exam BP 110/70 mmHg  Pulse 80  Ht 5\' 7"  (1.702 m)  Wt 143 lb 4 oz (64.978 kg)  BMI 22.43 kg/m2 Well developed and well nourished in no acute distress HENT normal E scleral and icterus clear Neck Supple JVP flat; carotids brisk and full Clear to ausculation Device pocket well healed; without hematoma or erythema.  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  The device was reprogrammed to increase rate sensor.     One of the issues that it is suggested by her ECG is whether   ARVC Is the mechanism of her initial cardiac arrest given her ventricular ectopy. repeat signal average ECG was normal  She is tolerating sotalol and will check surveillance labs

## 2015-01-05 ENCOUNTER — Ambulatory Visit: Payer: 59

## 2015-01-05 LAB — MAGNESIUM: Magnesium: 2.5 mg/dL — ABNORMAL HIGH (ref 1.6–2.3)

## 2015-01-05 LAB — BASIC METABOLIC PANEL
BUN/Creatinine Ratio: 14 (ref 11–26)
BUN: 12 mg/dL (ref 8–27)
CHLORIDE: 103 mmol/L (ref 97–108)
CO2: 24 mmol/L (ref 18–29)
Calcium: 10 mg/dL (ref 8.7–10.3)
Creatinine, Ser: 0.83 mg/dL (ref 0.57–1.00)
GFR calc Af Amer: 88 mL/min/{1.73_m2} (ref >59)
GFR, EST NON AFRICAN AMERICAN: 76 mL/min/{1.73_m2} (ref >59)
Glucose: 95 mg/dL (ref 65–99)
Potassium: 4.8 mmol/L (ref 3.5–5.2)
SODIUM: 145 mmol/L — AB (ref 134–144)

## 2015-01-07 ENCOUNTER — Ambulatory Visit
Admission: RE | Admit: 2015-01-07 | Discharge: 2015-01-07 | Disposition: A | Discharge status: Home / Self Care | Payer: 59 | Source: Ambulatory Visit | Attending: Internal Medicine | Admitting: Internal Medicine

## 2015-01-07 ENCOUNTER — Other Ambulatory Visit: Payer: Self-pay | Admitting: Internal Medicine

## 2015-01-07 DIAGNOSIS — R1011 Right upper quadrant pain: Secondary | ICD-10-CM

## 2015-01-07 DIAGNOSIS — K8020 Calculus of gallbladder without cholecystitis without obstruction: Secondary | ICD-10-CM

## 2015-01-07 MED ORDER — SINCALIDE 5 MCG IJ SOLR
0.0200 ug/kg | Freq: Once | INTRAMUSCULAR | Status: AC
  Administered 2015-01-07: 1.3 ug via INTRAVENOUS

## 2015-01-07 MED ORDER — TECHNETIUM TC 99M MEBROFENIN IV KIT
5.3220 | PACK | Freq: Once | INTRAVENOUS | Status: AC | PRN
  Administered 2015-01-07: 5.322 via INTRAVENOUS

## 2015-01-08 ENCOUNTER — Encounter: Payer: Self-pay | Admitting: Internal Medicine

## 2015-01-09 ENCOUNTER — Encounter: Payer: Self-pay | Admitting: Internal Medicine

## 2015-01-25 ENCOUNTER — Ambulatory Visit: Payer: 59 | Attending: Pain Medicine | Admitting: Pain Medicine

## 2015-01-25 ENCOUNTER — Encounter: Payer: Self-pay | Admitting: Pain Medicine

## 2015-01-25 VITALS — BP 136/56 | HR 62 | Temp 97.7°F | Resp 16 | Ht 67.0 in | Wt 138.0 lb

## 2015-01-25 DIAGNOSIS — M533 Sacrococcygeal disorders, not elsewhere classified: Secondary | ICD-10-CM | POA: Diagnosis not present

## 2015-01-25 DIAGNOSIS — M5416 Radiculopathy, lumbar region: Secondary | ICD-10-CM

## 2015-01-25 DIAGNOSIS — M5136 Other intervertebral disc degeneration, lumbar region: Secondary | ICD-10-CM | POA: Diagnosis not present

## 2015-01-25 DIAGNOSIS — M4806 Spinal stenosis, lumbar region: Secondary | ICD-10-CM | POA: Diagnosis not present

## 2015-01-25 DIAGNOSIS — M545 Low back pain: Secondary | ICD-10-CM | POA: Diagnosis present

## 2015-01-25 DIAGNOSIS — M5126 Other intervertebral disc displacement, lumbar region: Secondary | ICD-10-CM | POA: Insufficient documentation

## 2015-01-25 DIAGNOSIS — M47816 Spondylosis without myelopathy or radiculopathy, lumbar region: Secondary | ICD-10-CM

## 2015-01-25 DIAGNOSIS — M1288 Other specific arthropathies, not elsewhere classified, other specified site: Secondary | ICD-10-CM | POA: Diagnosis not present

## 2015-01-25 DIAGNOSIS — M48062 Spinal stenosis, lumbar region with neurogenic claudication: Secondary | ICD-10-CM

## 2015-01-25 DIAGNOSIS — M79604 Pain in right leg: Secondary | ICD-10-CM | POA: Diagnosis present

## 2015-01-25 DIAGNOSIS — M79605 Pain in left leg: Secondary | ICD-10-CM | POA: Diagnosis present

## 2015-01-25 NOTE — Patient Instructions (Addendum)
Continue present medications.  F/U PCP for evaliation of  BP and general medical  Condition.  Lumbar facet, medial branch blocks, to be performed 02/07/2015  We will need permission from Dr. Derrel Nip or Dr. [name] to hold general aspirin for 5 days prior to the lumbar facet injection  F/U surgical evaluation.  F/U neurological evaluation.  May consider radiofrequency rhizolysis or intraspinal procedures pending response to present treatment and F/U evaluation.  Patient to call [facility] Management Center should patient have concerns prior to scheduled return appointment This is a 24-hour urine test in which the breakdown products of the sex hormones are measured. 17-ketosteroids form when the body breaks down malFacet Blocks Patient Information  Description: The facets are joints in the spine between the vertebrae.  Like any joints in the body, facets can become irritated and painful.  Arthritis can also effect the facets.  By injecting steroids and local anesthetic in and around these joints, we can temporarily block the nerve supply to them.  Steroids act directly on irritated nerves and tissues to reduce selling and inflammation which often leads to decreased pain.  Facet blocks may be done anywhere along the spine from the neck to the low back depending upon the location of your pain.   After numbing the skin with local anesthetic (like Novocaine), a small needle is passed onto the facet joints under x-ray guidance.  You may experience a sensation of pressure while this is being done.  The entire block usually lasts about 15-25 minutes.   Conditions which may be treated by facet blocks:   Low back/buttock pain  Neck/shoulder pain  Certain types of headaches  Preparation for the injection:  1. Do not eat any solid food or dairy products within 6 hours of your appointment. 2. You may drink clear liquid up to 2 hours before appointment.  Clear liquids include water, black coffee, juice or  soda.  No milk or cream please. 3. You may take your regular medication, including pain medications, with a sip of water before your appointment.  Diabetics should hold regular insulin (if taken separately) and take 1/2 normal NPH dose the morning of the procedure.  Carry some sugar containing items with you to your appointment. 4. A driver must accompany you and be prepared to drive you home after your procedure. 5. Bring all your current medications with you. 6. An IV may be inserted and sedation may be given at the discretion of the physician. 7. A blood pressure cuff, EKG and other monitors will often be applied during the procedure.  Some patients may need to have extra oxygen administered for a short period. 8. You will be asked to provide medical information, including your allergies and medications, prior to the procedure.  We must know immediately if you are taking blood thinners (like Coumadin/Warfarin) or if you are allergic to IV iodine contrast (dye).  We must know if you could possible be pregnant.  Possible side-effects:   Bleeding from needle site  Infection (rare, may require surgery)  Nerve injury (rare)  Numbness & tingling (temporary)  Difficulty urinating (rare, temporary)  Spinal headache (a headache worse with upright posture)  Light-headedness (temporary)  Pain at injection site (serveral days)  Decreased blood pressure (rare, temporary)  Weakness in arm/leg (temporary)  Pressure sensation in back/neck (temporary)   Call if you experience:   Fever/chills associated with headache or increased back/neck pain  Headache worsened by an upright position  New onset, weakness or numbness of  an extremity below the injection site  Hives or difficulty breathing (go to the emergency room)  Inflammation or drainage at the injection site(s)  Severe back/neck pain greater than usual  New symptoms which are concerning to you  Please note:  Although the  local anesthetic injected can often make your back or neck feel good for several hours after the injection, the pain will likely return. It takes 3-7 days for steroids to work.  You may not notice any pain relief for at least one week.  If effective, we will often do a series of 2-3 injections spaced 3-6 weeks apart to maximally decrease your pain.  After the initial series, you may be a candidate for a more permanent nerve block of the facets.  If you have any questions, please call #[phone]) [facility]

## 2015-01-25 NOTE — Progress Notes (Signed)
   Subjective:    Patient ID: Nicole Swanson, female    DOB: May 01, 1956, 59 y.o.   MRN: [id number]  HPI    Review of Systems     Objective:   Physical Exam        Assessment & Plan:

## 2015-01-25 NOTE — Progress Notes (Signed)
Ambulatory discharged at 1025am teach back  Times 3 . No meds  Faxed request to stop aspirin to Dr. Derrel Nip

## 2015-01-25 NOTE — Progress Notes (Signed)
   Subjective:    Patient ID: Nicole Swanson, female    DOB: [birth year], 59 y.o.   MRN: [id number]  HPI  Patient is 59 year old female who comes to [facility] management Center at the request of Dr. Derrel Nip for further evaluation and treatment of pain involving the lumbar and lower extremity regions patient states that her pain is been present for approximately 4 years and has recently become increasingly more severe and more incapacitating. The patient described her pain as aching unknowing constant and at times intermittent nagging pulsating tenderness throbbing tingling tiring uncomfortable shooting. The pain awakens patient from sleep and interferes with patient's ability to go to sleep. Pain is associated with weakness tingling numbness in ability to control bladder as well. Pain is increased with bending kneeling lifting and sitting standing squatting stooping walking working. Pain decreased with hot packs Colpacs standing dictations. It is also undergone chiropractic treatment which initially was of benefit and has been without benefit some significant period of time. We discussed patient's prior findings including CT of the lumbar spine and we'll proceed with lumbar facet, medial branch nerve blocks, at time return appointment in attempt to decrease severity of patient's symptoms aggression of patient's symptoms. Knee patient was understanding and in agreement status treatment plan      Review of Systems  Cardiovascular Abnormal heart rhythm daily aspirin intake pacemaker prior heart catheterization  Pulmonary Asthma positive cigarette smoking  Neurological unremarkable  Psychological unremarkable  Gastro-intestinal  GI ulcers Estrasorb reflux disease and irritable bowel syndrome constipation  Genitourinary Recurrent urinary tract infections  Hematological unremarkable  Endocrine unremarkable  Rheumatological Osteoarthritis  Musculoskeletal unremarkable          Objective:    Physical Exam  There was tends to palpation of the splenius capitis and occipitalis muscles of mild degree. Palpation of the acromioclavicular and glenohumeral joint regions reproduced mild discomfort. Patient appeared to be with bilaterally equal grip strength. Tinel and Phalen's maneuver without increased pain of significant degree. Palpation of the thoracic facet thoracic paraspinal musculature region without increased pain of significant degree. No crepitus of the thoracic region noted palpation of the lumbar paraspinal musculature region lumbar facet region associated take to palpation of moderately severe degree." Associated with moderately severe discomfort. Was tends to palpation of the PSIS and PIS and PII S regions of moderate degree severe degree. Leg raising tolerates approximately 30 without an increase of pain with dorsiflexion noted. No definite sensory deficit of dermatomal distribution detected. Native clonus negative Homans abdomen nontender. No costovertebral angle tenderness noted.    Assessment & Plan:    Degenerative disc disease lumbar spine L1-2 minimal right paracentral protrusion, L2-3 L2 foraminal stenosis left greater than the right, L3-4 mild concentric disc bulge with facet hypertrophy, L4-5 mild facet arthropathy, concentric disc bulge at L2-3 and L3-4 and L4-L5  Lumbar facet syndrome  Lumbar radiculopathy  Sacroiliac joint dysfunction   Plan  Continue present medications  Lumbar facet, medial branch nerve blocks, to be performed at time of return appointment pending medical clearance by Dr. Derrel Nip  F/U PCP for evaliation of  BP and general medical  condition.  F/U surgical evaluation.  F/U neurological evaluation.  May consider radiofrequency rhizolysis or intraspinal procedures pending response to present treatment and F/U evaluation.  Patient to call Pain Management Center should patient have concerns prior to scheduled return appointment.

## 2015-02-03 ENCOUNTER — Other Ambulatory Visit: Payer: Self-pay | Admitting: Pain Medicine

## 2015-02-04 ENCOUNTER — Other Ambulatory Visit: Payer: Self-pay | Admitting: Pain Medicine

## 2015-02-04 DIAGNOSIS — M533 Sacrococcygeal disorders, not elsewhere classified: Secondary | ICD-10-CM

## 2015-02-04 DIAGNOSIS — M47817 Spondylosis without myelopathy or radiculopathy, lumbosacral region: Secondary | ICD-10-CM

## 2015-02-04 DIAGNOSIS — D696 Thrombocytopenia, unspecified: Secondary | ICD-10-CM

## 2015-02-04 DIAGNOSIS — M5416 Radiculopathy, lumbar region: Secondary | ICD-10-CM

## 2015-02-04 DIAGNOSIS — M47816 Spondylosis without myelopathy or radiculopathy, lumbar region: Secondary | ICD-10-CM

## 2015-02-04 DIAGNOSIS — M51369 Other intervertebral disc degeneration, lumbar region without mention of lumbar back pain or lower extremity pain: Secondary | ICD-10-CM

## 2015-02-04 DIAGNOSIS — M5136 Other intervertebral disc degeneration, lumbar region: Secondary | ICD-10-CM

## 2015-02-07 ENCOUNTER — Encounter: Payer: Self-pay | Admitting: Pain Medicine

## 2015-02-07 ENCOUNTER — Ambulatory Visit: Payer: 59 | Attending: Pain Medicine | Admitting: Pain Medicine

## 2015-02-07 VITALS — BP 119/76 | HR 59 | Temp 97.7°F | Resp 16 | Ht 67.0 in | Wt 138.0 lb

## 2015-02-07 DIAGNOSIS — M5136 Other intervertebral disc degeneration, lumbar region: Secondary | ICD-10-CM

## 2015-02-07 DIAGNOSIS — M545 Low back pain: Secondary | ICD-10-CM | POA: Diagnosis present

## 2015-02-07 DIAGNOSIS — M5126 Other intervertebral disc displacement, lumbar region: Secondary | ICD-10-CM | POA: Insufficient documentation

## 2015-02-07 DIAGNOSIS — R209 Unspecified disturbances of skin sensation: Secondary | ICD-10-CM | POA: Insufficient documentation

## 2015-02-07 DIAGNOSIS — M47816 Spondylosis without myelopathy or radiculopathy, lumbar region: Secondary | ICD-10-CM | POA: Insufficient documentation

## 2015-02-07 DIAGNOSIS — M79605 Pain in left leg: Secondary | ICD-10-CM | POA: Diagnosis present

## 2015-02-07 DIAGNOSIS — M79604 Pain in right leg: Secondary | ICD-10-CM | POA: Diagnosis present

## 2015-02-07 DIAGNOSIS — M5416 Radiculopathy, lumbar region: Secondary | ICD-10-CM

## 2015-02-07 DIAGNOSIS — M48062 Spinal stenosis, lumbar region with neurogenic claudication: Secondary | ICD-10-CM

## 2015-02-07 DIAGNOSIS — M533 Sacrococcygeal disorders, not elsewhere classified: Secondary | ICD-10-CM

## 2015-02-07 MED ORDER — MIDAZOLAM HCL 5 MG/5ML IJ SOLN
INTRAMUSCULAR | Status: AC
  Administered 2015-02-07: 3 mg via INTRAVENOUS
  Filled 2015-02-07: qty 5

## 2015-02-07 MED ORDER — TRIAMCINOLONE ACETONIDE 40 MG/ML IJ SUSP
INTRAMUSCULAR | Status: AC
  Administered 2015-02-07: 40 mg
  Filled 2015-02-07: qty 1

## 2015-02-07 MED ORDER — BUPIVACAINE HCL (PF) 0.25 % IJ SOLN
INTRAMUSCULAR | Status: AC
  Administered 2015-02-07: 30 mL
  Filled 2015-02-07: qty 30

## 2015-02-07 MED ORDER — FENTANYL CITRATE (PF) 100 MCG/2ML IJ SOLN
INTRAMUSCULAR | Status: AC
  Administered 2015-02-07: 100 ug via INTRAVENOUS
  Filled 2015-02-07: qty 2

## 2015-02-07 MED ORDER — ORPHENADRINE CITRATE 30 MG/ML IJ SOLN
INTRAMUSCULAR | Status: AC
  Administered 2015-02-07: 60 mg
  Filled 2015-02-07: qty 2

## 2015-02-07 MED ORDER — DOXYCYCLINE HYCLATE 100 MG PO TABS
100.0000 mg | ORAL_TABLET | Freq: Two times a day (BID) | ORAL | Status: DC
Start: 2015-02-07 — End: 2015-09-08

## 2015-02-07 NOTE — Progress Notes (Signed)
   Subjective:    Patient ID: Nicole Swanson, female    DOB: 08/16/1956, 59 y.o.   MRN: [id number]  HPI    Review of Systems     Objective:   Physical Exam        Assessment & Plan:

## 2015-02-07 NOTE — Progress Notes (Signed)
Safety precautions to be maintained throughout the outpatient stay will include: orient to surroundings, keep bed in low position, maintain call bell within reach at all times, provide assistance with transfer out of bed and ambulation.  

## 2015-02-07 NOTE — Progress Notes (Signed)
Subjective:    Patient ID: Nicole Swanson, female    DOB: 1956/01/21, 59 y.o.   MRN: [id number]  HPI  PROCEDURE PERFORMED: Lumbar facet (medial branch block)   NOTE: The patient is a 59 y.o. female who returns to [facility] for further evaluation and treatment of pain involving the lumbar and lower extremity region. CT scan  revealed the patient to be with evidence of moderate degenerative changes at L2-3, progressed from 2012. L1 to right paracentral disc protrusion. There is concern regarding degenerative changes of the lumbar spine felt to be contributing to patient's lumbar lower extremity pain and paresthesias which is felt to be reduced to 2 significant component of facet syndrome.. The risks, benefits, and expectations of the procedure have been discussed and explained to the patient who was understanding and in agreement with suggested treatment plan. We will proceed with interventional treatment as discussed and as explained to the patient who was understanding and wished to proceed with procedure as planned.   DESCRIPTION OF PROCEDURE: Lumbar facet (medial branch block) with IV Versed, IV fentanyl conscious sedation, EKG, blood pressure, pulse, and pulse oximetry monitoring. The procedure was performed with the patient in the prone position. Betadine prep of proposed entry site performed.   NEEDLE PLACEMENT AT: Left L 2 lumbar facet (medial branch block). Under fluoroscopic guidance with oblique orientation of 15 degrees, a 22-gauge needle was inserted at the L 2 vertebral body level with needle placed at the targeted area of Burton's Eye or Eye of the Scotty Dog with documentation of needle placement in the superior and lateral border of targeted area of Burton's Eye or Eye of the Scotty Dog with oblique orientation of 15 degrees. Following documentation of needle placement at the L 2 vertebral body level, needle placement was then accomplished at the L 3 vertebral body  level.   Needle placement at L3, L4, and L5 vertebral body levels on the left side Needle placement was accomplished at L3, L4, and L5 vertebral body levels exactly as was performed at the L2 vertebral body level on the left side  NEEDLE PLACEMENT AT THE SACRAL ALA with AP view of the lumbosacral spine. With the patient in the prone position, Betadine prep of proposed entry site accomplished, a 22 gauge needle was inserted in the region of the sacral ala (groove formed by the superior articulating process of S1 and the sacral wing)..      Needle placement was then verified at all levels on lateral view. Following documentation of needle placement at all levels on lateral view and following negative aspiration for heme and CSF, each level was injected with 1 mL of 0.25% bupivacaine with Kenalog.  Lumbar facet, medial branch nerve, blocks performed on the right side The procedure was performed on the right side at L2, L3, L4, L5, and sacral ala on the right side exactly as was performed on the left side     The patient tolerated the procedure well. A total of 40 mg of Kenalog was utilized for the procedure.   PLAN:  1. Medications: The patient will continue presently prescribed medications. 2. May consider modification of treatment regimen at time of return appointment pending response to treatment rendered on today's visit. 3. The patient is to follow-up with primary care physician for further evaluation of blood pressure and general medical condition status post steroid injection performed on today's visit. 4. Surgical follow-up evaluation. 5. Neurological follow-up evaluation. 6. The patient may be candidate  for radiofrequency procedures, implantation type procedures, and other treatment pending response to treatment and follow-up evaluation. 7. The patient has been advised to call the Pain Management Center prior to scheduled return appointment should there be significant change in  condition or should patient have other concerns regarding condition prior to scheduled return appointment.  The patient is understanding and in agreement with suggested treatment plan.   Review of Systems     Objective:   Physical Exam        Assessment & Plan:

## 2015-02-07 NOTE — Patient Instructions (Addendum)
Continue present medications and antibiotics  F/U PCP for evaliation of  BP and general medical  condition.  F/U surgical evaluation.  F/U neurological evaluation.  May consider radiofrequency rhizolysis or intraspinal procedures pending response to present treatment and F/U evaluation.  Patient to call Pain Management Center should patient have concerns prior to scheduled return appointment. Pain Management Discharge Instructions  General Discharge Instructions :  If you need to reach your doctor call: Monday-Friday 8:00 am - 4:00 pm at [phone] or toll free [phone].  After clinic hours [phone] to have operator reach doctor.  Bring all of your medication bottles to all your appointments in the pain clinic.  To cancel or reschedule your appointment with Pain Management please remember to call 24 hours in advance to avoid a fee.  Refer to the educational materials which you have been given on: General Risks, I had my Procedure. Discharge Instructions, Post Sedation.  Post Procedure Instructions:  The drugs you were given will stay in your system until tomorrow, so for the next 24 hours you should not drive, make any legal decisions or drink any alcoholic beverages.  You may eat anything you prefer, but it is better to start with liquids then soups and crackers, and gradually work up to solid foods.  Please notify your doctor immediately if you have any unusual bleeding, trouble breathing or pain that is not related to your normal pain.  Depending on the type of procedure that was done, some parts of your body may feel week and/or numb.  This usually clears up by tonight or the next day.  Walk with the use of an assistive device or accompanied by an adult for the 24 hours.  You may use ice on the affected area for the first 24 hours.  Put ice in a Ziploc bag and cover with a towel and place against area 15 minutes on 15 minutes off.  You may switch to heat after 24  hours.

## 2015-02-08 ENCOUNTER — Telehealth: Payer: Self-pay | Admitting: *Deleted

## 2015-02-08 NOTE — Telephone Encounter (Signed)
No problems upon post procedure phone call.

## 2015-03-08 ENCOUNTER — Ambulatory Visit: Payer: 59 | Attending: Pain Medicine | Admitting: Pain Medicine

## 2015-03-08 ENCOUNTER — Encounter: Payer: Self-pay | Admitting: Pain Medicine

## 2015-03-08 VITALS — BP 124/54 | HR 69 | Temp 97.9°F | Resp 16 | Ht 67.0 in | Wt 135.0 lb

## 2015-03-08 DIAGNOSIS — M48062 Spinal stenosis, lumbar region with neurogenic claudication: Secondary | ICD-10-CM

## 2015-03-08 DIAGNOSIS — M47816 Spondylosis without myelopathy or radiculopathy, lumbar region: Secondary | ICD-10-CM | POA: Diagnosis not present

## 2015-03-08 DIAGNOSIS — M5416 Radiculopathy, lumbar region: Secondary | ICD-10-CM

## 2015-03-08 DIAGNOSIS — M5126 Other intervertebral disc displacement, lumbar region: Secondary | ICD-10-CM | POA: Insufficient documentation

## 2015-03-08 DIAGNOSIS — M5136 Other intervertebral disc degeneration, lumbar region: Secondary | ICD-10-CM | POA: Diagnosis not present

## 2015-03-08 DIAGNOSIS — M533 Sacrococcygeal disorders, not elsewhere classified: Secondary | ICD-10-CM

## 2015-03-08 DIAGNOSIS — M545 Low back pain: Secondary | ICD-10-CM | POA: Diagnosis present

## 2015-03-08 DIAGNOSIS — M4806 Spinal stenosis, lumbar region: Secondary | ICD-10-CM | POA: Insufficient documentation

## 2015-03-08 NOTE — Patient Instructions (Addendum)
Continue present medications  Lumbar epidural steroid injection Monday, 03/28/2015  F/U PCP Dr.Tullo for evaliation of  BP and general medical  condition.  F/U surgical evaluation  F/U neurological evaluation  May consider radiofrequency rhizolysis or intraspinal procedures pending response to present treatment and F/U evaluation.  Patient to call Pain Management Center should patient have concerns prior to scheduled return appointment. GENERAL RISKS AND COMPLICATIONS  What are the risk, side effects and possible complications? Generally speaking, most procedures are safe.  However, with any procedure there are risks, side effects, and the possibility of complications.  The risks and complications are dependent upon the sites that are lesioned, or the type of nerve block to be performed.  The closer the procedure is to the spine, the more serious the risks are.  Great care is taken when placing the radio frequency needles, block needles or lesioning probes, but sometimes complications can occur. 1. Infection: Any time there is an injection through the skin, there is a risk of infection.  This is why sterile conditions are used for these blocks.  There are four possible types of infection. 1. Localized skin infection. 2. Central Nervous System Infection-This can be in the form of Meningitis, which can be deadly. 3. Epidural Infections-This can be in the form of an epidural abscess, which can cause pressure inside of the spine, causing compression of the spinal cord with subsequent paralysis. This would require an emergency surgery to decompress, and there are no guarantees that the patient would recover from the paralysis. 4. Discitis-This is an infection of the intervertebral discs.  It occurs in about 1% of discography procedures.  It is difficult to treat and it may lead to surgery.        2. Pain: the needles have to go through skin and soft tissues, will cause soreness.       3. Damage to  internal structures:  The nerves to be lesioned may be near blood vessels or    other nerves which can be potentially damaged.       4. Bleeding: Bleeding is more common if the patient is taking blood thinners such as  aspirin, Coumadin, Ticiid, Plavix, etc., or if he/she have some genetic predisposition  such as hemophilia. Bleeding into the spinal canal can cause compression of the spinal  cord with subsequent paralysis.  This would require an emergency surgery to  decompress and there are no guarantees that the patient would recover from the  paralysis.       5. Pneumothorax:  Puncturing of a lung is a possibility, every time a needle is introduced in  the area of the chest or upper back.  Pneumothorax refers to free air around the  collapsed lung(s), inside of the thoracic cavity (chest cavity).  Another two possible  complications related to a similar event would include: Hemothorax and Chylothorax.   These are variations of the Pneumothorax, where instead of air around the collapsed  lung(s), you may have blood or chyle, respectively.       6. Spinal headaches: They may occur with any procedures in the area of the spine.       7. Persistent CSF (Cerebro-Spinal Fluid) leakage: This is a rare problem, but may occur  with prolonged intrathecal or epidural catheters either due to the formation of a fistulous  track or a dural tear.       8. Nerve damage: By working so close to the spinal cord, there is always a  possibility of  nerve damage, which could be as serious as a permanent spinal cord injury with  paralysis.       9. Death:  Although rare, severe deadly allergic reactions known as "Anaphylactic  reaction" can occur to any of the medications used.      10. Worsening of the symptoms:  We can always make thing worse.  What are the chances of something like this happening? Chances of any of this occuring are extremely low.  By statistics, you have more of a chance of getting killed in a motor  vehicle accident: while driving to the hospital than any of the above occurring .  Nevertheless, you should be aware that they are possibilities.  In general, it is similar to taking a shower.  Everybody knows that you can slip, hit your head and get killed.  Does that mean that you should not shower again?  Nevertheless always keep in mind that statistics do not mean anything if you happen to be on the wrong side of them.  Even if a procedure has a 1 (one) in a 1,000,000 (million) chance of going wrong, it you happen to be that one..Also, keep in mind that by statistics, you have more of a chance of having something go wrong when taking medications.  Who should not have this procedure? If you are on a blood thinning medication (e.g. Coumadin, Plavix, see list of "Blood Thinners"), or if you have an active infection going on, you should not have the procedure.  If you are taking any blood thinners, please inform your physician.  How should I prepare for this procedure?  Do not eat or drink anything at least six hours prior to the procedure.  Bring a driver with you .  It cannot be a taxi.  Come accompanied by an adult that can drive you back, and that is strong enough to help you if your legs get weak or numb from the local anesthetic.  Take all of your medicines the morning of the procedure with just enough water to swallow them.  If you have diabetes, make sure that you are scheduled to have your procedure done first thing in the morning, whenever possible.  If you have diabetes, take only half of your insulin dose and notify our nurse that you have done so as soon as you arrive at the clinic.  If you are diabetic, but only take blood sugar pills (oral hypoglycemic), then do not take them on the morning of your procedure.  You may take them after you have had the procedure.  Do not take aspirin or any aspirin-containing medications, at least eleven (11) days prior to the procedure.  They may  prolong bleeding.  Wear loose fitting clothing that may be easy to take off and that you would not mind if it got stained with Betadine or blood.  Do not wear any jewelry or perfume  Remove any nail coloring.  It will interfere with some of our monitoring equipment.  NOTE: Remember that this is not meant to be interpreted as a complete list of all possible complications.  Unforeseen problems may occur.  BLOOD THINNERS The following drugs contain aspirin or other products, which can cause increased bleeding during surgery and should not be taken for 2 weeks prior to and 1 week after surgery.  If you should need take something for relief of minor pain, you may take acetaminophen which is found in Tylenol,m Datril, Anacin-3 and Panadol. It  is not blood thinner. The products listed below are.  Do not take any of the products listed below in addition to any listed on your instruction sheet.  A.P.C or A.P.C with Codeine Codeine Phosphate Capsules #3 Ibuprofen Ridaura  ABC compound Congesprin Imuran rimadil  Advil Cope Indocin Robaxisal  Alka-Seltzer Effervescent Pain Reliever and Antacid Coricidin or Coricidin-D  Indomethacin Rufen  Alka-Seltzer plus Cold Medicine Cosprin Ketoprofen S-A-C Tablets  Anacin Analgesic Tablets or Capsules Coumadin Korlgesic Salflex  Anacin Extra Strength Analgesic tablets or capsules CP-2 Tablets Lanoril Salicylate  Anaprox Cuprimine Capsules Levenox Salocol  Anexsia-D Dalteparin Magan Salsalate  Anodynos Darvon compound Magnesium Salicylate Sine-off  Ansaid Dasin Capsules Magsal Sodium Salicylate  Anturane Depen Capsules Marnal Soma  APF Arthritis pain formula Dewitt's Pills Measurin Stanback  Argesic Dia-Gesic Meclofenamic Sulfinpyrazone  Arthritis Bayer Timed Release Aspirin Diclofenac Meclomen Sulindac  Arthritis pain formula Anacin Dicumarol Medipren Supac  Analgesic (Safety coated) Arthralgen Diffunasal Mefanamic Suprofen  Arthritis Strength Bufferin  Dihydrocodeine Mepro Compound Suprol  Arthropan liquid Dopirydamole Methcarbomol with Aspirin Synalgos  ASA tablets/Enseals Disalcid Micrainin Tagament  Ascriptin Doan's Midol Talwin  Ascriptin A/D Dolene Mobidin Tanderil  Ascriptin Extra Strength Dolobid Moblgesic Ticlid  Ascriptin with Codeine Doloprin or Doloprin with Codeine Momentum Tolectin  Asperbuf Duoprin Mono-gesic Trendar  Aspergum Duradyne Motrin or Motrin IB Triminicin  Aspirin plain, buffered or enteric coated Durasal Myochrisine Trigesic  Aspirin Suppositories Easprin Nalfon Trillsate  Aspirin with Codeine Ecotrin Regular or Extra Strength Naprosyn Uracel  Atromid-S Efficin Naproxen Ursinus  Auranofin Capsules Elmiron Neocylate Vanquish  Axotal Emagrin Norgesic Verin  Azathioprine Empirin or Empirin with Codeine Normiflo Vitamin E  Azolid Emprazil Nuprin Voltaren  Bayer Aspirin plain, buffered or children's or timed BC Tablets or powders Encaprin Orgaran Warfarin Sodium  Buff-a-Comp Enoxaparin Orudis Zorpin  Buff-a-Comp with Codeine Equegesic Os-Cal-Gesic   Buffaprin Excedrin plain, buffered or Extra Strength Oxalid   Bufferin Arthritis Strength Feldene Oxphenbutazone   Bufferin plain or Extra Strength Feldene Capsules Oxycodone with Aspirin   Bufferin with Codeine Fenoprofen Fenoprofen Pabalate or Pabalate-SF   Buffets II Flogesic Panagesic   Buffinol plain or Extra Strength Florinal or Florinal with Codeine Panwarfarin   Buf-Tabs Flurbiprofen Penicillamine   Butalbital Compound Four-way cold tablets Penicillin   Butazolidin Fragmin Pepto-Bismol   Carbenicillin Geminisyn Percodan   Carna Arthritis Reliever Geopen Persantine   Carprofen Gold's salt Persistin   Chloramphenicol Goody's Phenylbutazone   Chloromycetin Haltrain Piroxlcam   Clmetidine heparin Plaquenil   Cllnoril Hyco-pap Ponstel   Clofibrate Hydroxy chloroquine Propoxyphen         Before stopping any of these medications, be sure to consult the  physician who ordered them.  Some, such as Coumadin (Warfarin) are ordered to prevent or treat serious conditions such as "deep thrombosis", "pumonary embolisms", and other heart problems.  The amount of time that you may need off of the medication may also vary with the medication and the reason for which you were taking it.  If you are taking any of these medications, please make sure you notify your pain physician before you undergo any procedures.         Epidural Steroid Injection Patient Information  Description: The epidural space surrounds the nerves as they exit the spinal cord.  In some patients, the nerves can be compressed and inflamed by a bulging disc or a tight spinal canal (spinal stenosis).  By injecting steroids into the epidural space, we can bring irritated nerves into direct contact with a  potentially helpful medication.  These steroids act directly on the irritated nerves and can reduce swelling and inflammation which often leads to decreased pain.  Epidural steroids may be injected anywhere along the spine and from the neck to the low back depending upon the location of your pain.   After numbing the skin with local anesthetic (like Novocaine), a small needle is passed into the epidural space slowly.  You may experience a sensation of pressure while this is being done.  The entire block usually last less than 10 minutes.  Conditions which may be treated by epidural steroids:   Low back and leg pain  Neck and arm pain  Spinal stenosis  Post-laminectomy syndrome  Herpes zoster (shingles) pain  Pain from compression fractures  Preparation for the injection:  1. Do not eat any solid food or dairy products within 6 hours of your appointment.  2. You may drink clear liquids up to 2 hours before appointment.  Clear liquids include water, black coffee, juice or soda.  No milk or cream please. 3. You may take your regular medication, including pain medications, with a  sip of water before your appointment  Diabetics should hold regular insulin (if taken separately) and take 1/2 normal NPH dos the morning of the procedure.  Carry some sugar containing items with you to your appointment. 4. A driver must accompany you and be prepared to drive you home after your procedure.  5. Bring all your current medications with your. 6. An IV may be inserted and sedation may be given at the discretion of the physician.   7. A blood pressure cuff, EKG and other monitors will often be applied during the procedure.  Some patients may need to have extra oxygen administered for a short period. 8. You will be asked to provide medical information, including your allergies, prior to the procedure.  We must know immediately if you are taking blood thinners (like Coumadin/Warfarin)  Or if you are allergic to IV iodine contrast (dye). We must know if you could possible be pregnant.  Possible side-effects:  Bleeding from needle site  Infection (rare, may require surgery)  Nerve injury (rare)  Numbness & tingling (temporary)  Difficulty urinating (rare, temporary)  Spinal headache ( a headache worse with upright posture)  Light -headedness (temporary)  Pain at injection site (several days)  Decreased blood pressure (temporary)  Weakness in arm/leg (temporary)  Pressure sensation in back/neck (temporary)  Call if you experience:  Fever/chills associated with headache or increased back/neck pain.  Headache worsened by an upright position.  New onset weakness or numbness of an extremity below the injection site  Hives or difficulty breathing (go to the emergency room)  Inflammation or drainage at the infection site  Severe back/neck pain  Any new symptoms which are concerning to you  Please note:  Although the local anesthetic injected can often make your back or neck feel good for several hours after the injection, the pain will likely return.  It takes 3-7  days for steroids to work in the epidural space.  You may not notice any pain relief for at least that one week.  If effective, we will often do a series of three injections spaced 3-6 weeks apart to maximally decrease your pain.  After the initial series, we generally will wait several months before considering a repeat injection of the same type.  If you have any questions, please call [phone] [facility] discharged home  teach back 3 ambulatory at 929

## 2015-03-08 NOTE — Progress Notes (Signed)
Safety precautions to be maintained throughout the outpatient stay will include: orient to surroundings, keep bed in low position, maintain call bell within reach at all times, provide assistance with transfer out of bed and ambulation.  

## 2015-03-08 NOTE — Progress Notes (Signed)
   Subjective:    Patient ID: Nicole Swanson, female    DOB: [birth year], 59 y.o.   MRN: [id number]  HPI  Patient is 59 year old female returns to [facility] for further evaluation and treatment of pain involving the lower back lower extremity region. Patient's pain is increased with prolonged standing and is associated with weakness of the lower extremity. Patient denies any trauma change in events of daily living the cause change in symptoms. Patient states that the pain becomes more intense as the day progresses. We discussed patient's condition and will consider patient for treatment including interventional treatment return appointment consisting of lumbar epidural steroid injection the patient was understanding and in agreement status treatment plan.     Review of Systems     Objective:   Physical Exam  There was tenderness of the splenius capitis and occipitalis musculature region of mild degree. There was unremarkable Spurling's maneuver. Palpation over the cervical facet and cervical paraspinal musculature region was without increased pain of significant degree there was minimal increase of pain with palpation over the thoracic facet thoracic paraspinal musculature region. No crepitus of the thoracic region was noted. Over the lumbar paraspinal muscles lumbar facet region was a tends to palpation with tenderness over the region of the PSIS and PII S region of moderate degree. Leg raising was tolerated to approximately 20 on the left EHL strength appeared to be decreased. No definite sensory deficit of dermatomal distribution detected. There was negative clonus negative Homans. Abdomen nontender and no costovertebral maintenance was noted.      Assessment & Plan:   Degenerative disc disease lumbar spine Moderate degenerative changes at L2-3 progressed from 2012 L1 right paracentral disc protrusion. Multilevel degenerative changes noted throughout the lumbar spine with areas  of narrowing  Lumbar stenosis with neurogenic claudication  Lumbar facet syndrome   Plan    Continue present medications  Lumbar epidural steroid injection to be performed at time of return appointment  F/U PCP for evaliation of  BP and general medical  condition.  F/U surgical evaluation  F/U neurological evaluation  May consider radiofrequency rhizolysis or intraspinal procedures pending response to present treatment and F/U evaluation.  Patient to call Pain Management Center should patient have concerns prior to scheduled return appointment.

## 2015-03-10 ENCOUNTER — Encounter: Payer: Self-pay | Admitting: Internal Medicine

## 2015-03-11 ENCOUNTER — Encounter: Payer: Self-pay | Admitting: Internal Medicine

## 2015-03-28 ENCOUNTER — Ambulatory Visit: Payer: 59 | Attending: Pain Medicine | Admitting: Pain Medicine

## 2015-03-28 VITALS — BP 111/65 | HR 59 | Temp 96.6°F | Resp 14 | Ht 67.0 in | Wt 134.0 lb

## 2015-03-28 DIAGNOSIS — M47816 Spondylosis without myelopathy or radiculopathy, lumbar region: Secondary | ICD-10-CM | POA: Diagnosis not present

## 2015-03-28 DIAGNOSIS — M545 Low back pain: Secondary | ICD-10-CM | POA: Diagnosis present

## 2015-03-28 DIAGNOSIS — M48062 Spinal stenosis, lumbar region with neurogenic claudication: Secondary | ICD-10-CM

## 2015-03-28 DIAGNOSIS — M5416 Radiculopathy, lumbar region: Secondary | ICD-10-CM

## 2015-03-28 DIAGNOSIS — M79604 Pain in right leg: Secondary | ICD-10-CM | POA: Diagnosis present

## 2015-03-28 DIAGNOSIS — M79605 Pain in left leg: Secondary | ICD-10-CM | POA: Diagnosis present

## 2015-03-28 DIAGNOSIS — M533 Sacrococcygeal disorders, not elsewhere classified: Secondary | ICD-10-CM

## 2015-03-28 DIAGNOSIS — M5136 Other intervertebral disc degeneration, lumbar region: Secondary | ICD-10-CM

## 2015-03-28 MED ORDER — SODIUM CHLORIDE 0.9 % IJ SOLN
INTRAMUSCULAR | Status: AC
  Administered 2015-03-28: 4 mL
  Filled 2015-03-28: qty 20

## 2015-03-28 MED ORDER — MIDAZOLAM HCL 5 MG/5ML IJ SOLN
INTRAMUSCULAR | Status: AC
  Administered 2015-03-28: 2 mg via INTRAVENOUS
  Filled 2015-03-28: qty 5

## 2015-03-28 MED ORDER — ORPHENADRINE CITRATE 30 MG/ML IJ SOLN
INTRAMUSCULAR | Status: AC
  Administered 2015-03-28: 60 mg
  Filled 2015-03-28: qty 2

## 2015-03-28 MED ORDER — FENTANYL CITRATE (PF) 100 MCG/2ML IJ SOLN
INTRAMUSCULAR | Status: AC
  Administered 2015-03-28: 50 ug via INTRAVENOUS
  Filled 2015-03-28: qty 2

## 2015-03-28 MED ORDER — TRIAMCINOLONE ACETONIDE 40 MG/ML IJ SUSP
INTRAMUSCULAR | Status: AC
  Administered 2015-03-28: 40 mg
  Filled 2015-03-28: qty 1

## 2015-03-28 MED ORDER — LIDOCAINE HCL (PF) 1 % IJ SOLN
INTRAMUSCULAR | Status: AC
  Administered 2015-03-28: 3 mL
  Filled 2015-03-28: qty 5

## 2015-03-28 MED ORDER — DOXYCYCLINE HYCLATE 100 MG PO TABS
100.0000 mg | ORAL_TABLET | Freq: Two times a day (BID) | ORAL | Status: DC
Start: 2015-03-28 — End: 2015-09-08

## 2015-03-28 MED ORDER — BUPIVACAINE HCL (PF) 0.25 % IJ SOLN
INTRAMUSCULAR | Status: AC
  Administered 2015-03-28: 10 mL
  Filled 2015-03-28: qty 30

## 2015-03-28 MED ORDER — LIDOCAINE HCL (PF) 2 % IJ SOLN
INTRAMUSCULAR | Status: AC
  Administered 2015-03-28: 3 mL
  Filled 2015-03-28: qty 10

## 2015-03-28 NOTE — Progress Notes (Signed)
Safety precautions to be maintained throughout the outpatient stay will include: orient to surroundings, keep bed in low position, maintain call bell within reach at all times, provide assistance with transfer out of bed and ambulation.  

## 2015-03-28 NOTE — Progress Notes (Signed)
   Subjective:    Patient ID: Nicole Swanson, female    DOB: August 01, 1956, 60 y.o.   MRN: [id number]  HPI  PROCEDURE PERFORMED: Lumbar epidural steroid injection   NOTE: The patient is a 59 y.o. female who returns to [facility] for further evaluation and treatment of pain involving the lumbar and lower extremity region. MRI revealed the patient to be with multilevel degenerative changes of the lumbar spine There is concern regarding intraspinal abnormalities contributing to patient's symptomatology. The risks, benefits, and expectations of the procedure have been discussed and explained to the patient who was understanding and in agreement with suggested treatment plan. We will proceed with lumbar epidural steroid injection as discussed and as explained to the patient who is willing to proceed with procedure as planned.   DESCRIPTION OF PROCEDURE: Lumbar epidural steroid injection with IV Versed, IV fentanyl conscious sedation, EKG, blood pressure, pulse, and pulse oximetry monitoring. The procedure was performed with the patient in the prone position under fluoroscopic guidance. A local anesthetic skin wheal of 1.5% plain lidocaine was accomplished at proposed entry site. An 18-gauge Tuohy epidural needle was inserted at the L 4 vertebral body level left of the midline via loss-of-resistance technique with negative heme and negative CSF return. A total of 4 mL of Preservative-Free normal saline with 40 mg of Kenalog injected incrementally via epidurally placed needle. Needle was removed.  Myoneural block injections of the gluteal musculature region Following Betadine prep of proposed entry site a 22-gauge needle was inserted in the gluteal musculature region and following negative aspiration 2 cc of 0.25% bupivacaine with Norflex was injected for myoneural block injections musculature region performed 4    A total of 40 mg of Kenalog was utilized for the procedure.   The patient  tolerated the injection well.    PLAN:   1. Medications: We will continue presently prescribed medications. 2. Will consider modification of treatment regimen pending response to treatment rendered on today's visit and follow-up evaluation. 3. The patient is to follow-up with primary care physician Dr. Derrel Nip regarding blood pressure and general medical condition status post lumbar epidural steroid injection performed on today's visit. 4. Surgical evaluation. 5. Neurological evaluation. 6. The patient may be a candidate for radiofrequency procedures, implantation device, and other treatment pending response to treatment and follow-up evaluation. 7. The patient has been advised to adhere to proper body mechanics and avoid activities which appear to aggravate condition. 8. The patient has been advised to call the Pain Management Center prior to scheduled return appointment should there be significant change in condition or should there be sign  The patient is understanding and agrees with the suggested  treatment plan      Review of Systems     Objective:   Physical Exam        Assessment & Plan:

## 2015-03-28 NOTE — Patient Instructions (Addendum)
Continue present medication and began taking your antibiotic Doxycycline today  F/U PCP Dr Derrel Nip  for evaliation of  BP and general medical  condition.  F/U surgical evaluation  F/U neurological evaluation  May consider radiofrequency rhizolysis or intraspinal procedures pending response to present treatment and F/U evaluation.  Patient to call [facility] should patient have concerns prior to scheduled return appointment. Pain Management Discharge Instructions  General Discharge Instructions :  If you need to reach your doctor call: Monday-Friday 8:00 am - 4:00 pm at [phone] or toll free [phone].  After clinic hours [phone] to have operator reach doctor.  Bring all of your medication bottles to all your appointments in the pain clinic.  To cancel or reschedule your appointment with Pain Management please remember to call 24 hours in advance to avoid a fee.  Refer to the educational materials which you have been given on: General Risks, I had my Procedure. Discharge Instructions, Post Sedation.  Post Procedure Instructions:  The drugs you were given will stay in your system until tomorrow, so for the next 24 hours you should not drive, make any legal decisions or drink any alcoholic beverages.  You may eat anything you prefer, but it is better to start with liquids then soups and crackers, and gradually work up to solid foods.  Please notify your doctor immediately if you have any unusual bleeding, trouble breathing or pain that is not related to your normal pain.  Depending on the type of procedure that was done, some parts of your body may feel week and/or numb.  This usually clears up by tonight or the next day.  Walk with the use of an assistive device or accompanied by an adult for the 24 hours.  You may use ice on the affected area for the first 24 hours.  Put ice in a Ziploc bag and cover with a towel and place against area 15 minutes on 15 minutes  off.  You may switch to heat after 24 hours.Epidural Steroid Injection Patient Information  Description: The epidural space surrounds the nerves as they exit the spinal cord.  In some patients, the nerves can be compressed and inflamed by a bulging disc or a tight spinal canal (spinal stenosis).  By injecting steroids into the epidural space, we can bring irritated nerves into direct contact with a potentially helpful medication.  These steroids act directly on the irritated nerves and can reduce swelling and inflammation which often leads to decreased pain.  Epidural steroids may be injected anywhere along the spine and from the neck to the low back depending upon the location of your pain.   After numbing the skin with local anesthetic (like Novocaine), a small needle is passed into the epidural space slowly.  You may experience a sensation of pressure while this is being done.  The entire block usually last less than 10 minutes.  Conditions which may be treated by epidural steroids:   Low back and leg pain  Neck and arm pain  Spinal stenosis  Post-laminectomy syndrome  Herpes zoster (shingles) pain  Pain from compression fractures  Preparation for the injection:  1. Do not eat any solid food or dairy products within 6 hours of your appointment.  2. You may drink clear liquids up to 2 hours before appointment.  Clear liquids include water, black coffee, juice or soda.  No milk or cream please. 3. You may take your regular medication, including pain medications, with a sip of water  before your appointment  Diabetics should hold regular insulin (if taken separately) and take 1/2 normal NPH dos the morning of the procedure.  Carry some sugar containing items with you to your appointment. 4. A driver must accompany you and be prepared to drive you home after your procedure.  5. Bring all your current medications with your. 6. An IV may be inserted and sedation may be given at the discretion  of the physician.   7. A blood pressure cuff, EKG and other monitors will often be applied during the procedure.  Some patients may need to have extra oxygen administered for a short period. 8. You will be asked to provide medical information, including your allergies, prior to the procedure.  We must know immediately if you are taking blood thinners (like Coumadin/Warfarin)  Or if you are allergic to IV iodine contrast (dye). We must know if you could possible be pregnant.  Possible side-effects:  Bleeding from needle site  Infection (rare, may require surgery)  Nerve injury (rare)  Numbness & tingling (temporary)  Difficulty urinating (rare, temporary)  Spinal headache ( a headache worse with upright posture)  Light -headedness (temporary)  Pain at injection site (several days)  Decreased blood pressure (temporary)  Weakness in arm/leg (temporary)  Pressure sensation in back/neck (temporary)  Call if you experience:  Fever/chills associated with headache or increased back/neck pain.  Headache worsened by an upright position.  New onset weakness or numbness of an extremity below the injection site  Hives or difficulty breathing (go to the emergency room)  Inflammation or drainage at the infection site  Severe back/neck pain  Any new symptoms which are concerning to you  Please note:  Although the local anesthetic injected can often make your back or neck feel good for several hours after the injection, the pain will likely return.  It takes 3-7 days for steroids to work in the epidural space.  You may not notice any pain relief for at least that one week.  If effective, we will often do a series of three injections spaced 3-6 weeks apart to maximally decrease your pain.  After the initial series, we generally will wait several months before considering a repeat injection of the same type.  If you have any questions, please call [phone] [facility]

## 2015-03-29 ENCOUNTER — Telehealth: Payer: Self-pay | Admitting: *Deleted

## 2015-03-29 NOTE — Telephone Encounter (Signed)
No message

## 2015-04-04 ENCOUNTER — Telehealth: Payer: Self-pay | Admitting: Internal Medicine

## 2015-04-04 ENCOUNTER — Encounter: Payer: Self-pay | Admitting: Internal Medicine

## 2015-04-04 ENCOUNTER — Telehealth: Payer: Self-pay | Admitting: *Deleted

## 2015-04-04 ENCOUNTER — Ambulatory Visit (INDEPENDENT_AMBULATORY_CARE_PROVIDER_SITE_OTHER): Payer: 59 | Admitting: Internal Medicine

## 2015-04-04 ENCOUNTER — Other Ambulatory Visit: Payer: Self-pay | Admitting: Pain Medicine

## 2015-04-04 VITALS — BP 110/62 | HR 64 | Temp 97.7°F | Resp 14 | Ht 67.0 in | Wt 134.8 lb

## 2015-04-04 DIAGNOSIS — M4806 Spinal stenosis, lumbar region: Secondary | ICD-10-CM

## 2015-04-04 DIAGNOSIS — M48062 Spinal stenosis, lumbar region with neurogenic claudication: Secondary | ICD-10-CM

## 2015-04-04 MED ORDER — HYDROCODONE-ACETAMINOPHEN 10-325 MG PO TABS: 1.0000 | ORAL_TABLET | Freq: Three times a day (TID) | ORAL | Status: DC | PRN

## 2015-04-04 NOTE — Patient Instructions (Signed)
Please return for fasting labs Asap  I am recommending neurosurgical referral and use of FMLA while we get your pain under control  Trial of generic Vicodin 10/325  1/2 to 1 tablet every 8 hours (max 3 daily)   Use Sennakot or Dulcolax if it makes you constipated

## 2015-04-04 NOTE — Progress Notes (Signed)
Pre-visit discussion using our clinic review tool. No additional management support is needed unless otherwise documented below in the visit note.  

## 2015-04-04 NOTE — Telephone Encounter (Signed)
FMLA paperwork in red folder

## 2015-04-04 NOTE — Progress Notes (Signed)
Subjective:  Patient ID: Nicole Swanson, female    DOB: May 24, 1956  Age: 59 y.o. MRN: [id number]  CC: The encounter diagnosis was Spinal stenosis, lumbar region, with neurogenic claudication.  HPI Nicole Swanson presents for management of persistent low back pain secondary to spinal stenosis by prior CT.   Patient has undergone mulitple facet injections ,  Which provided only 10 days of relief,  Followed by an ESI which provided only 2 days of relief  .  The injections were done by Dr. Mohammed Kindle.    She states that pain vecame more severe after receiving the ESI and now radiates to the top of her right foot.  She has not notified Dr Primus Bravo of this development, and her next follow up appt with him  Is in 3 weeks.,    Pain  started 4 years ago,  Has tried PT,,  4 years of manipulation,  Followed by referral to Pain clinic. Cannot tolerate gabapetin due to prior trilas which caused dizziness  Tramadol takes the edge off but casues decreased alertness so she cannot take it and work .  She has onset of pain after sitting for more than 20 minutes ,  Lying down feels better. 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      Outpatient Prescriptions Prior to Visit  Medication Sig Dispense Refill  . aspirin 81 MG tablet Take 81 mg by mouth daily.      . Calcium Carb-Cholecalciferol (CALCIUM PLUS VITAMIN D3 PO) Take by mouth daily.     . Calcium Carb-Cholecalciferol [phone] MG-UNIT CAPS Take by mouth.    . Cetirizine HCl 10 MG CAPS Take 10 mg by mouth daily.    . Cranberry (THERACRAN PO) Take 180 mg by mouth 2 (two) times daily.     Marland Kitchen DEXILANT 60 MG capsule TAKE 1 CAPSULE BY MOUTH DAILY. 30 capsule 5  . docusate sodium (COLACE) 250 MG capsule Take 250 mg by mouth 2 (two) times daily.    Marland Kitchen doxycycline (VIBRA-TABS) 100 MG tablet Take 1 tablet (100 mg total) by mouth 2 (two) times daily. 14 tablet Eval  . doxycycline (VIBRA-TABS) 100 MG tablet Take 1  tablet (100 mg total) by mouth 2 (two) times daily. 14 tablet Eval  . fexofenadine (ALLEGRA) 180 MG tablet Take 180 mg by mouth as needed for allergies or rhinitis.    . fluticasone (FLONASE) 50 MCG/ACT nasal spray Place 1 spray into both nostrils daily. 16 g 5  . Lactulose 20 GM/30ML SOLN Take 30 mLs (20 g total) by mouth every 4 (four) hours as needed. Until constipation is relieved. 240 mL 1  . Misc Natural Products (OSTEO BI-FLEX TRIPLE STRENGTH PO) Take by mouth 2 (two) times daily.     . montelukast (SINGULAIR) 10 MG tablet TAKE 1 TABLET BY MOUTH AT BEDTIME. 90 tablet 1  . ondansetron (ZOFRAN) 8 MG tablet Take 1 tablet (8 mg total) by mouth every 8 (eight) hours as needed for nausea or vomiting. 20 tablet 0  . polyethylene glycol-electrolytes (NULYTELY/GOLYTELY) 420 G solution Drink 8 ounces of solution every 8 hours until constipation is relieved 4000 mL 0  . sotalol (BETAPACE) 120 MG tablet Take 1 tablet (120 mg total) by mouth 2 (two) times daily. 180 tablet 3  . traMADol (ULTRAM) 50 MG tablet Take 2 tablets (100 mg total) by mouth every 6 (six) hours as needed. 180 tablet 2  . vitamin  C (ASCORBIC ACID) 500 MG tablet Take 500 mg by mouth 2 (two) times daily.    . diflunisal (DOLOBID) 500 MG TABS tablet Take 1 tablet (500 mg total) by mouth 2 (two) times daily. (Patient not taking: Reported on 04/04/2015) 60 tablet 0   No facility-administered medications prior to visit.    Review of Systems;  Patient denies headache, fevers, malaise, unintentional weight loss, skin rash, eye pain, sinus congestion and sinus pain, sore throat, dysphagia,  hemoptysis , cough, dyspnea, wheezing, chest pain, palpitations, orthopnea, edema, abdominal pain, nausea, melena, diarrhea, constipation, flank pain, dysuria, hematuria, urinary  Frequency, nocturia, numbness, tingling, seizures,  Focal weakness, Loss of consciousness,  Tremor, insomnia, depression, anxiety, and suicidal ideation.      Objective:  BP  110/62 mmHg  Pulse 64  Temp(Src) 97.7 F (36.5 C) (Oral)  Resp 14  Ht 5\' 7"  (1.702 m)  Wt 134 lb 12 oz (61.122 kg)  BMI 21.10 kg/m2  SpO2 98%  BP Readings from Last 3 Encounters:  04/04/15 110/62  03/28/15 111/65  03/08/15 124/54    Wt Readings from Last 3 Encounters:  04/04/15 134 lb 12 oz (61.122 kg)  03/28/15 134 lb (60.782 kg)  03/08/15 135 lb (61.236 kg)    General appearance: alert, cooperative and appears stated age Ears: normal TM's and external ear canals both ears Throat: lips, mucosa, and tongue normal; teeth and gums normal Neck: no adenopathy, no carotid bruit, supple, symmetrical, trachea midline and thyroid not enlarged, symmetric, no tenderness/mass/nodules Back: symmetric, no curvature. ROM normal. No CVA tenderness. Lungs: clear to auscultation bilaterally Heart: regular rate and rhythm, S1, S2 normal, no murmur, click, rub or gallop Abdomen: soft, non-tender; bowel sounds normal; no masses,  no organomegaly Pulses: 2+ and symmetric Skin: Skin color, texture, turgor normal. No rashes or lesions Lymph nodes: Cervical, supraclavicular, and axillary nodes normal.  No results found for: HGBA1C  Lab Results  Component Value Date   CREATININE 0.83 01/04/2015   CREATININE 0.9 07/02/2014   CREATININE 0.85 12/27/2013    Lab Results  Component Value Date   WBC 3.7* 07/02/2014   HGB 13.1 07/02/2014   HCT 40.5 07/02/2014   PLT 129.0* 07/02/2014   GLUCOSE 95 01/04/2015   CHOL 258* 07/02/2014   TRIG 86.0 07/02/2014   HDL 79.30 07/02/2014   LDLCALC 162* 07/02/2014   ALT 25 07/02/2014   AST 28 07/02/2014   NA 145* 01/04/2015   K 4.8 01/04/2015   CL 103 01/04/2015   CREATININE 0.83 01/04/2015   BUN 12 01/04/2015   CO2 24 01/04/2015   TSH 1.13 09/10/2013   INR 0.9 03/18/2012    Nm Hepato W/eject Fract  01/07/2015   CLINICAL DATA:  Abdominal pain.  EXAM: NUCLEAR MEDICINE HEPATOBILIARY IMAGING WITH GALLBLADDER EF  TECHNIQUE: Sequential images of the  abdomen were obtained out to 60 minutes following intravenous administration of radiopharmaceutical. After slow intravenous infusion of 1.3 micrograms Cholecystokinin, gallbladder ejection fraction was determined.  RADIOPHARMACEUTICALS:  5.3 mCi Technetium-7m Choletec IV  COMPARISON:  Ultrasound 10/15/2014  FINDINGS: Liver, biliary system, gallbladder, bowel visualize normally. Gallbladder ejection fraction at 45 min is 78%. This is normal. At 45 min, normal ejection fraction is greater than 40%.  IMPRESSION: Normal exam.   Electronically Signed   By: Marcello Moores  Register   On: 01/07/2015 10:27    Assessment & Plan:   Problem List Items Addressed This Visit      Unprioritized   Spinal stenosis, lumbar region, with neurogenic  claudication - Primary    secondary to DDD lumbar spine , failing conversative treatment .  Rerral to Neurosurgery. FMLA forms requested.  to be filled out .  I am prescribing vicodin 10/325 tid for pain control . Bowel regimen discussed.       Relevant Orders   Ambulatory referral to Neurosurgery      I have discontinued Ms. Buescher's diflunisal. I am also having her start on HYDROcodone-acetaminophen. Additionally, I am having her maintain her aspirin, docusate sodium, Cetirizine HCl, polyethylene glycol-electrolytes, sotalol, Lactulose, fluticasone, Misc Natural Products (OSTEO BI-FLEX TRIPLE STRENGTH PO), vitamin C, Cranberry (THERACRAN PO), Calcium Carb-Cholecalciferol, montelukast, DEXILANT, ondansetron, traMADol, Calcium Carb-Cholecalciferol (CALCIUM PLUS VITAMIN D3 PO), fexofenadine, doxycycline, and doxycycline.  Meds ordered this encounter  Medications  . HYDROcodone-acetaminophen (NORCO) 10-325 MG per tablet    Sig: Take 1 tablet by mouth every 8 (eight) hours as needed.    Dispense:  90 tablet    Refill:  0    Medications Discontinued During This Encounter  Medication Reason  . diflunisal (DOLOBID) 500 MG TABS tablet Patient Preference    Follow-up: No  Follow-up on file.   [name], MD

## 2015-04-04 NOTE — Telephone Encounter (Signed)
Pt called stated" I have been having problems with my procedure since Monday." "I had relief the first day,and then Tuesday it came back with a vengenance." I saw Dr. Derrel Nip thisi morning and she gave me oxycodone number 90 10/325mg  one q 8 hours." "She has scheduled me with a neurosurgeon in [place]." " The right foot and front of my leg hurts also, and I can't put weight on my leg."

## 2015-04-04 NOTE — Telephone Encounter (Signed)
Nurses and Caryl Pina Please call patient and schedule patient for lumbar epidural steroid injection for Monday, 04/11/2015 Nurses please call patient to review medications for aspirin and other anticoagulant medications and discuss with me We will need permission of Dr. to low to hold patient's anticoagulant medications or 3 days prior to lumbar epidural steroid injection

## 2015-04-04 NOTE — Assessment & Plan Note (Addendum)
secondary to DDD lumbar spine , failing conversative treatment .  Rerral to Neurosurgery.  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  FMLA forms requested  to be filled out .  I am prescribing vicodin 10/325 tid for pain control . Bowel regimen discussed.

## 2015-04-05 ENCOUNTER — Ambulatory Visit (INDEPENDENT_AMBULATORY_CARE_PROVIDER_SITE_OTHER): Payer: 59 | Admitting: *Deleted

## 2015-04-05 DIAGNOSIS — I469 Cardiac arrest, cause unspecified: Secondary | ICD-10-CM | POA: Diagnosis not present

## 2015-04-05 NOTE — Telephone Encounter (Signed)
LVM for patient to call us back re: lesi that Dr. Primus Bravo wants to schedule her for. Instructed in VM for patient to call us back.

## 2015-04-05 NOTE — Telephone Encounter (Signed)
appt scheduled

## 2015-04-05 NOTE — Telephone Encounter (Signed)
patient returned call. States she will call and schedule appointment for Monday or Wednesday of next week for LESI. States not taking any anticoagulants at this time. Pre procedure instructions given with patients understanding.

## 2015-04-06 ENCOUNTER — Other Ambulatory Visit (INDEPENDENT_AMBULATORY_CARE_PROVIDER_SITE_OTHER): Payer: 59

## 2015-04-06 ENCOUNTER — Telehealth: Payer: Self-pay | Admitting: Internal Medicine

## 2015-04-06 DIAGNOSIS — E785 Hyperlipidemia, unspecified: Secondary | ICD-10-CM | POA: Diagnosis not present

## 2015-04-06 DIAGNOSIS — E559 Vitamin D deficiency, unspecified: Secondary | ICD-10-CM | POA: Diagnosis not present

## 2015-04-06 DIAGNOSIS — R1030 Lower abdominal pain, unspecified: Secondary | ICD-10-CM

## 2015-04-06 DIAGNOSIS — R5383 Other fatigue: Secondary | ICD-10-CM | POA: Diagnosis not present

## 2015-04-06 DIAGNOSIS — Z7689 Persons encountering health services in other specified circumstances: Secondary | ICD-10-CM

## 2015-04-06 DIAGNOSIS — D696 Thrombocytopenia, unspecified: Secondary | ICD-10-CM

## 2015-04-06 LAB — CBC WITH DIFFERENTIAL/PLATELET
BASOS ABS: 0 10*3/uL (ref 0.0–0.1)
BASOS PCT: 0.7 % (ref 0.0–3.0)
Basophils Absolute: 0 10*3/uL (ref 0.0–0.1)
Basophils Relative: 0.7 % (ref 0.0–3.0)
EOS PCT: 1.3 % (ref 0.0–5.0)
Eosinophils Absolute: 0.1 10*3/uL (ref 0.0–0.7)
Eosinophils Absolute: 0.1 10*3/uL (ref 0.0–0.7)
Eosinophils Relative: 1.4 % (ref 0.0–5.0)
HCT: 39.5 % (ref 36.0–46.0)
HEMATOCRIT: 39.8 % (ref 36.0–46.0)
HEMOGLOBIN: 13.2 g/dL (ref 12.0–15.0)
HEMOGLOBIN: 13.2 g/dL (ref 12.0–15.0)
LYMPHS ABS: 1.2 10*3/uL (ref 0.7–4.0)
LYMPHS PCT: 29.2 % (ref 12.0–46.0)
Lymphocytes Relative: 29.4 % (ref 12.0–46.0)
Lymphs Abs: 1.2 10*3/uL (ref 0.7–4.0)
MCHC: 33.3 g/dL (ref 30.0–36.0)
MCHC: 33.5 g/dL (ref 30.0–36.0)
MCV: 88.1 fl (ref 78.0–100.0)
MCV: 87.6 fl (ref 78.0–100.0)
MONO ABS: 0.4 10*3/uL (ref 0.1–1.0)
MONO ABS: 0.4 10*3/uL (ref 0.1–1.0)
MONOS PCT: 8.9 % (ref 3.0–12.0)
MONOS PCT: 9.5 % (ref 3.0–12.0)
NEUTROS ABS: 2.4 10*3/uL (ref 1.4–7.7)
NEUTROS PCT: 59.1 % (ref 43.0–77.0)
NEUTROS PCT: 59.8 % (ref 43.0–77.0)
Neutro Abs: 2.4 10*3/uL (ref 1.4–7.7)
PLATELETS: 156 10*3/uL (ref 150.0–400.0)
PLATELETS: 157 10*3/uL (ref 150.0–400.0)
RBC: 4.51 Mil/uL (ref 3.87–5.11)
RBC: 4.51 Mil/uL (ref 3.87–5.11)
RDW: 13.9 % (ref 11.5–15.5)
RDW: 13.7 % (ref 11.5–15.5)
WBC: 4.1 10*3/uL (ref 4.0–10.5)
WBC: 4.1 10*3/uL (ref 4.0–10.5)

## 2015-04-06 LAB — COMPREHENSIVE METABOLIC PANEL
ALBUMIN: 4.4 g/dL (ref 3.5–5.2)
ALT: 24 U/L (ref 0–35)
AST: 21 U/L (ref 0–37)
Alkaline Phosphatase: 63 U/L (ref 39–117)
BUN: 16 mg/dL (ref 6–23)
CALCIUM: 9.9 mg/dL (ref 8.4–10.5)
CO2: 27 meq/L (ref 19–32)
Chloride: 104 mEq/L (ref 96–112)
Creatinine, Ser: 0.8 mg/dL (ref 0.40–1.20)
GFR: 77.43 mL/min (ref >60.00)
Glucose, Bld: 97 mg/dL (ref 70–99)
Potassium: 4.4 mEq/L (ref 3.5–5.1)
Sodium: 139 mEq/L (ref 135–145)
TOTAL PROTEIN: 6.9 g/dL (ref 6.0–8.3)
Total Bilirubin: 0.9 mg/dL (ref 0.2–1.2)

## 2015-04-06 LAB — LIPID PANEL
CHOL/HDL RATIO: 3
Cholesterol: 236 mg/dL — ABNORMAL HIGH (ref 0–200)
HDL: 89.2 mg/dL (ref >39.00)
LDL Cholesterol: 134 mg/dL — ABNORMAL HIGH (ref 0–99)
NONHDL: 147.23
Triglycerides: 67 mg/dL (ref 0.0–149.0)
VLDL: 13.4 mg/dL (ref 0.0–40.0)

## 2015-04-06 LAB — VITAMIN D 25 HYDROXY (VIT D DEFICIENCY, FRACTURES): VITD: 18.68 ng/mL — ABNORMAL LOW (ref 30.00–100.00)

## 2015-04-06 NOTE — Telephone Encounter (Signed)
Patient states she has not taken ASA since May, 2016. Advised to not take it before the procedure.

## 2015-04-06 NOTE — Telephone Encounter (Signed)
Pended fasting drew regular tubes what DX and anything else?

## 2015-04-06 NOTE — Telephone Encounter (Signed)
FMLA form has been completed and returned to you in red folder.  Please make copy for chart,   , patient to pick up form and to be responsible for submitting it to employer,  The charge is $50

## 2015-04-06 NOTE — Progress Notes (Signed)
Remote ICD transmission.   

## 2015-04-06 NOTE — Telephone Encounter (Signed)
D69.6 for CBC ,  Vit D def  E55.9   , hyperlipidemia  E78.5  and "other fatigue"   R53.83    It's easier not toanticipate what I need and pend them,  bc I don't know how to get to them once they are pended,  and it takes mores steps for me to find the codes. To give to you

## 2015-04-06 NOTE — Telephone Encounter (Signed)
-----   Message from Mohammed Kindle, MD sent at 04/05/2015  7:56 AM EDT ----- Regarding: Please schedule LESI for Monday or Wednesday, August 8 or August 10 Beverley Fiedler, and Nurses,  Please schedule patient for lumbar epidural steroid injection for Monday or Wednesday, August 8 or 04/13/2015 Nurses, Please check for aspirin or similar medications and discussed with me. We will need to hold aspirin for 5 days if patient is taking aspirin. Please discussed with me

## 2015-04-06 NOTE — Telephone Encounter (Signed)
Labs entered.

## 2015-04-07 NOTE — Telephone Encounter (Signed)
I would wait, since the last one increased her pain,  Unless Dr Primus Bravo is able to reassure her that the last one did not CAUSE the new onset pain radiating to her foot

## 2015-04-07 NOTE — Telephone Encounter (Signed)
Called to advise patient FMLA completed and copy sent to scan, billing and faxed to [employer] as requested, patient requested original be mailed to her and request has been completed. Patient ask on phone if MD aware Dr. Primus Bravo scheduled another injection for pain management until consult with Neurosurgeon? Patient would like to know if MD agrees with another injection.

## 2015-04-07 NOTE — Telephone Encounter (Signed)
Patient notified and voiced That she will wait until she sees the neurosurgeon.

## 2015-04-10 ENCOUNTER — Encounter: Payer: Self-pay | Admitting: Internal Medicine

## 2015-04-10 ENCOUNTER — Other Ambulatory Visit: Payer: Self-pay | Admitting: Internal Medicine

## 2015-04-10 MED ORDER — ERGOCALCIFEROL 1.25 MG (50000 UT) PO CAPS: 50000.0000 [IU] | ORAL_CAPSULE | ORAL | Status: DC

## 2015-04-11 LAB — CUP PACEART REMOTE DEVICE CHECK
Battery Voltage: 3.08 V
Brady Statistic AP VP Percent: 0.02 %
Brady Statistic AP VS Percent: 70.7 %
Brady Statistic AS VP Percent: 0.03 %
Date Time Interrogation Session: 20160802073429
HIGH POWER IMPEDANCE MEASURED VALUE: 399 Ohm
HighPow Impedance: 50 Ohm
HighPow Impedance: 66 Ohm
Lead Channel Impedance Value: 399 Ohm
Lead Channel Impedance Value: 456 Ohm
Lead Channel Pacing Threshold Amplitude: 0.625 V
Lead Channel Pacing Threshold Amplitude: 0.625 V
Lead Channel Pacing Threshold Pulse Width: 0.4 ms
Lead Channel Pacing Threshold Pulse Width: 0.4 ms
Lead Channel Sensing Intrinsic Amplitude: 2.75 mV
Lead Channel Setting Pacing Amplitude: 2 V
Lead Channel Setting Pacing Amplitude: 2.5 V
Lead Channel Setting Pacing Pulse Width: 0.4 ms
MDC IDC MSMT LEADCHNL RA SENSING INTR AMPL: 2.75 mV
MDC IDC MSMT LEADCHNL RV SENSING INTR AMPL: 9.375 mV
MDC IDC MSMT LEADCHNL RV SENSING INTR AMPL: 9.375 mV
MDC IDC SET LEADCHNL RV SENSING SENSITIVITY: 0.45 mV
MDC IDC SET ZONE DETECTION INTERVAL: 350 ms
MDC IDC STAT BRADY AS VS PERCENT: 29.25 %
MDC IDC STAT BRADY RA PERCENT PACED: 70.72 %
MDC IDC STAT BRADY RV PERCENT PACED: 0.05 %
Zone Setting Detection Interval: 300 ms
Zone Setting Detection Interval: 360 ms
Zone Setting Detection Interval: 450 ms

## 2015-04-13 ENCOUNTER — Ambulatory Visit: Payer: 59 | Admitting: Pain Medicine

## 2015-04-28 ENCOUNTER — Ambulatory Visit: Payer: 59 | Admitting: Pain Medicine

## 2015-05-05 ENCOUNTER — Encounter: Payer: Self-pay | Admitting: Cardiology

## 2015-05-10 ENCOUNTER — Other Ambulatory Visit: Payer: Self-pay | Admitting: Neurosurgery

## 2015-05-10 DIAGNOSIS — M5136 Other intervertebral disc degeneration, lumbar region: Secondary | ICD-10-CM

## 2015-05-10 DIAGNOSIS — G959 Disease of spinal cord, unspecified: Secondary | ICD-10-CM

## 2015-05-18 ENCOUNTER — Other Ambulatory Visit: Payer: Self-pay | Admitting: Orthopedic Surgery

## 2015-05-19 ENCOUNTER — Ambulatory Visit
Admission: RE | Admit: 2015-05-19 | Discharge: 2015-05-19 | Disposition: A | Discharge status: Home / Self Care | Payer: 59 | Source: Ambulatory Visit | Attending: Neurosurgery | Admitting: Neurosurgery

## 2015-05-19 ENCOUNTER — Encounter: Payer: Self-pay | Admitting: Internal Medicine

## 2015-05-19 VITALS — BP 117/62 | HR 60

## 2015-05-19 DIAGNOSIS — M51369 Other intervertebral disc degeneration, lumbar region without mention of lumbar back pain or lower extremity pain: Secondary | ICD-10-CM

## 2015-05-19 DIAGNOSIS — G959 Disease of spinal cord, unspecified: Secondary | ICD-10-CM

## 2015-05-19 DIAGNOSIS — M47816 Spondylosis without myelopathy or radiculopathy, lumbar region: Secondary | ICD-10-CM

## 2015-05-19 DIAGNOSIS — M5136 Other intervertebral disc degeneration, lumbar region: Secondary | ICD-10-CM

## 2015-05-19 DIAGNOSIS — M5416 Radiculopathy, lumbar region: Secondary | ICD-10-CM

## 2015-05-19 DIAGNOSIS — M48062 Spinal stenosis, lumbar region with neurogenic claudication: Secondary | ICD-10-CM

## 2015-05-19 DIAGNOSIS — M5126 Other intervertebral disc displacement, lumbar region: Secondary | ICD-10-CM

## 2015-05-19 DIAGNOSIS — M533 Sacrococcygeal disorders, not elsewhere classified: Secondary | ICD-10-CM

## 2015-05-19 MED ORDER — ONDANSETRON HCL 4 MG/2ML IJ SOLN
4.0000 mg | Freq: Once | INTRAMUSCULAR | Status: AC
  Administered 2015-05-19: 4 mg via INTRAMUSCULAR

## 2015-05-19 MED ORDER — DIAZEPAM 5 MG PO TABS
10.0000 mg | ORAL_TABLET | Freq: Once | ORAL | Status: AC
  Administered 2015-05-19: 10 mg via ORAL

## 2015-05-19 MED ORDER — IOHEXOL 300 MG/ML  SOLN
10.0000 mL | Freq: Once | INTRAMUSCULAR | Status: DC | PRN
  Administered 2015-05-19: 10 mL via INTRATHECAL

## 2015-05-19 MED ORDER — MEPERIDINE HCL 100 MG/ML IJ SOLN
75.0000 mg | Freq: Once | INTRAMUSCULAR | Status: AC
  Administered 2015-05-19: 75 mg via INTRAMUSCULAR

## 2015-05-19 NOTE — Discharge Instructions (Signed)

## 2015-05-23 ENCOUNTER — Other Ambulatory Visit: Payer: Self-pay | Admitting: Internal Medicine

## 2015-05-30 ENCOUNTER — Other Ambulatory Visit: Payer: Self-pay | Admitting: Internal Medicine

## 2015-06-20 ENCOUNTER — Telehealth: Payer: Self-pay | Admitting: Internal Medicine

## 2015-06-20 NOTE — Telephone Encounter (Signed)
Call returned,  spoke with Dr [name] to confirm inability to work due to pain and sedation from the medications used to treat her pain

## 2015-06-20 NOTE — Telephone Encounter (Signed)
DR. [name] called from [facility] regarding pt ability to work. Land line [place] or cell [phone] available til 2pm [place]. Thank You!

## 2015-06-20 NOTE — Telephone Encounter (Signed)
Ok. Thank You! °

## 2015-07-07 ENCOUNTER — Ambulatory Visit (INDEPENDENT_AMBULATORY_CARE_PROVIDER_SITE_OTHER): Payer: 59 | Admitting: *Deleted

## 2015-07-07 DIAGNOSIS — I469 Cardiac arrest, cause unspecified: Secondary | ICD-10-CM | POA: Diagnosis not present

## 2015-07-08 NOTE — Progress Notes (Signed)
Remote ICD transmission.   

## 2015-07-25 ENCOUNTER — Telehealth: Payer: Self-pay | Admitting: Internal Medicine

## 2015-07-25 NOTE — Telephone Encounter (Signed)
Informed pt that her transmission was received. Pt verbalized understanding.  

## 2015-07-25 NOTE — Telephone Encounter (Signed)
°  1. Has your device fired? No      2. Is you device beeping? no  3. Are you experiencing draining or swelling at device site? no  4. Are you calling to see if we received your device transmission? Yes for 11.3.16  5. Have you passed out? no

## 2015-07-27 ENCOUNTER — Encounter: Payer: Self-pay | Admitting: Cardiology

## 2015-07-27 LAB — CUP PACEART REMOTE DEVICE CHECK
Brady Statistic RA Percent Paced: 70.41 %
Date Time Interrogation Session: 20161103073329
HIGH POWER IMPEDANCE MEASURED VALUE: 399 Ohm
HighPow Impedance: 46 Ohm
HighPow Impedance: 66 Ohm
Implantable Lead Implant Date: 19970917
Implantable Lead Implant Date: 20070119
Implantable Lead Location: 753860
Implantable Lead Model: 5076
Implantable Lead Model: 6942
Lead Channel Impedance Value: 399 Ohm
Lead Channel Pacing Threshold Amplitude: 0.625 V
Lead Channel Pacing Threshold Pulse Width: 0.4 ms
Lead Channel Sensing Intrinsic Amplitude: 10 mV
Lead Channel Sensing Intrinsic Amplitude: 2.25 mV
Lead Channel Sensing Intrinsic Amplitude: 2.25 mV
Lead Channel Setting Sensing Sensitivity: 0.45 mV
MDC IDC LEAD LOCATION: 753859
MDC IDC MSMT BATTERY VOLTAGE: 3.1 V
MDC IDC MSMT LEADCHNL RA IMPEDANCE VALUE: 437 Ohm
MDC IDC MSMT LEADCHNL RV PACING THRESHOLD AMPLITUDE: 0.75 V
MDC IDC MSMT LEADCHNL RV PACING THRESHOLD PULSEWIDTH: 0.4 ms
MDC IDC MSMT LEADCHNL RV SENSING INTR AMPL: 10 mV
MDC IDC SET LEADCHNL RA PACING AMPLITUDE: 2 V
MDC IDC SET LEADCHNL RV PACING AMPLITUDE: 2.5 V
MDC IDC SET LEADCHNL RV PACING PULSEWIDTH: 0.4 ms
MDC IDC STAT BRADY AP VP PERCENT: 0.02 %
MDC IDC STAT BRADY AP VS PERCENT: 70.39 %
MDC IDC STAT BRADY AS VP PERCENT: 0.03 %
MDC IDC STAT BRADY AS VS PERCENT: 29.56 %
MDC IDC STAT BRADY RV PERCENT PACED: 0.05 %

## 2015-08-08 ENCOUNTER — Telehealth: Payer: Self-pay | Admitting: *Deleted

## 2015-08-08 NOTE — Telephone Encounter (Signed)
Pt calling stating she is not feeling well.  She is saying she does not have any energy like she used to.  Gets more SOB more often.  Feels her Heart Beat more, when laying down. Like it is pulsing through her body.  Would like to know if she can come in and see [place] or adjust some setting.  She is not happy with how she is feeling Please advise.

## 2015-08-08 NOTE — Telephone Encounter (Signed)
Left message on machine for patient to contact the office.   

## 2015-08-09 NOTE — Telephone Encounter (Signed)
S/w pt who reports increased SOB since October. Noticing PVCs more, discomfort in chest.  States she saw [facility] pulmonologist in Oct and had a breathing test. Reports no change from test 6-9 months ago.  Denies chest pain, nauseated, diaphoresis, left arm pain. States when she lays down at night, her entire body pulsates along w/her heart beat. Pt has ICD. Suggested pt send transmission to device clinic for review. Pt agreeable w/plan and states she will send transmission when she gets home this evening.  Message sent to [facility]

## 2015-08-10 NOTE — Telephone Encounter (Signed)
Reviewed [facility] transmission.  Thoracic impedance abnormal.  Most recent echo in 2011.  PVC counters indicate decrease from 2.3 per hour prior to last session to 1.5 per hour this session.  Will address with Dr. [name] when he is back in the office.

## 2015-08-10 NOTE — Telephone Encounter (Signed)
Forward to [place] 

## 2015-08-11 NOTE — Telephone Encounter (Signed)
Nicole Swanson, can you document what the conversation was you had with Dr. [name] about this patient please.

## 2015-08-12 NOTE — Telephone Encounter (Signed)
Late entry from 08/11/15:  Dr. [name] called and discussed with patient various options for additional testing regarding her shortness of breath and patient wishes to wait until after the holidays to make a decision.

## 2015-09-05 ENCOUNTER — Other Ambulatory Visit: Payer: Self-pay | Admitting: Internal Medicine

## 2015-09-08 ENCOUNTER — Ambulatory Visit (INDEPENDENT_AMBULATORY_CARE_PROVIDER_SITE_OTHER): Payer: 59 | Admitting: Nurse Practitioner

## 2015-09-08 VITALS — BP 122/88 | HR 72 | Ht 67.0 in | Wt 138.0 lb

## 2015-09-08 DIAGNOSIS — R350 Frequency of micturition: Secondary | ICD-10-CM

## 2015-09-08 DIAGNOSIS — N8110 Cystocele, unspecified: Secondary | ICD-10-CM

## 2015-09-08 LAB — POCT URINALYSIS DIPSTICK
BILIRUBIN UA: NEGATIVE
Glucose, UA: NEGATIVE
KETONES UA: NEGATIVE
LEUKOCYTES UA: NEGATIVE
NITRITE UA: NEGATIVE
PH UA: 7
Protein, UA: NEGATIVE
Spec Grav, UA: 1.02
Urobilinogen, UA: 0.2

## 2015-09-08 NOTE — Progress Notes (Signed)
Patient ID: ROSE-MARIE KRONENWETTER, female    DOB: 08-16-1956  Age: 60 y.o. MRN: [id number]  CC: Abdominal Pain   HPI Nicole Swanson presents for CC of lower abdominal and back pain.   1) Pt reports back pain Dr. Annette Swanson is her neurosurgeon Nicole Swanson to pain management- Dr. Primus Swanson  "Dealing with back pain"  Dr. Jefm Swanson for arthritis- gastritis   OA and DJD   2) Pt has had a hysterectomy   Low and cramping for 1 month ago, bulging tissue from vaginal vault  Frequency with decreased amount  Smells urine all of the time, urine stream is "all over the place" Leaking- cough, sneezing, bearing down, after using restroom  Few spots of brownish in underwear   Every few hours, once at night, darker than usual  Drinking water 2-  8 oz glasses daily, 1 cup coffee, water or lemonade before bedtime   History Nicole Swanson has a past medical history of Aborted cardiac arrest (1994); DVT (deep vein thrombosis) in pregnancy (1986); Dual implantable cardiac defibrillator; 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   She has past surgical history that includes Cardiac defibrillator placement (1994); Nasal septum surgery (2006); Abdominal hysterectomy; Carpal tunnel release (Right); Laparoscopic lysis of adhesions; Appendectomy; Pacemaker insertion; and Oophorectomy.   Her family history includes Heart disease in her father; Heart disease (age of onset: 24) in her mother; Seizures in her brother and daughter; Stroke in her father and mother.She reports that she quit smoking about 20 years ago. Her smoking use included Cigarettes. She has never used smokeless tobacco. She reports that she drinks alcohol. She reports that she does not use illicit drugs.  Outpatient Prescriptions Prior to Visit  Medication Sig Dispense Refill  . aspirin 81 MG tablet Take 81 mg by mouth daily.       . Calcium Carb-Cholecalciferol [phone] MG-UNIT CAPS Take by mouth.    . Cetirizine HCl 10 MG CAPS Take 10 mg by mouth daily.    Marland Kitchen DEXILANT 60 MG capsule TAKE 1 CAPSULE BY MOUTH DAILY. 30 capsule 7  . docusate sodium (COLACE) 250 MG capsule Take 250 mg by mouth 2 (two) times daily.    . fexofenadine (ALLEGRA) 180 MG tablet Take 180 mg by mouth as needed for allergies or rhinitis.    . fluticasone (FLONASE) 50 MCG/ACT nasal spray USE 1 SPRAY IN EACH NOSTRIL DAILY 16 g 5  . HYDROcodone-acetaminophen (NORCO) 10-325 MG per tablet Take 1 tablet by mouth every 8 (eight) hours as needed. 90 tablet 0  . Lactulose 20 GM/30ML SOLN Take 30 mLs (20 g total) by mouth every 4 (four) hours as needed. Until constipation is relieved. 240 mL 1  . Misc Natural Products (OSTEO BI-FLEX TRIPLE STRENGTH PO) Take by mouth 2 (two) times daily.     . montelukast (SINGULAIR) 10 MG tablet TAKE 1 TABLET BY MOUTH AT BEDTIME. 90 tablet 1  . polyethylene glycol-electrolytes (NULYTELY/GOLYTELY) 420 G solution Drink 8 ounces of solution every 8 hours until constipation is relieved 4000 mL 0  . sotalol (BETAPACE) 120 MG tablet TAKE 1 TABLET BY MOUTH 2 TIMES DAILY. 180 tablet 3  . traMADol (ULTRAM) 50 MG tablet Take 2 tablets (100 mg total) by mouth every 6 (six) hours as needed. 180 tablet 2  . vitamin C (ASCORBIC ACID) 500 MG tablet Take 500 mg by mouth 2 (two) times daily.    Marland Kitchen  doxycycline (VIBRA-TABS) 100 MG tablet Take 1 tablet (100 mg total) by mouth 2 (two) times daily. 14 tablet Eval  . ondansetron (ZOFRAN) 8 MG tablet Take 1 tablet (8 mg total) by mouth every 8 (eight) hours as needed for nausea or vomiting. 20 tablet 0  . Calcium Carb-Cholecalciferol (CALCIUM PLUS VITAMIN D3 PO) Take by mouth daily. Reported on 09/08/2015    . Cranberry (THERACRAN PO) Take 180 mg by mouth 2 (two) times daily. Reported on 09/08/2015    . doxycycline (VIBRA-TABS) 100 MG tablet Take 1 tablet (100 mg total) by mouth 2 (two) times daily.  (Patient not taking: Reported on 09/08/2015) 14 tablet Eval  . ergocalciferol (DRISDOL) 50000 UNITS capsule Take 1 capsule (50,000 Units total) by mouth once a week. (Patient not taking: Reported on 09/08/2015) 4 capsule 0   No facility-administered medications prior to visit.    ROS Review of Systems  Constitutional: Negative for fever, chills, diaphoresis and fatigue.  Gastrointestinal: Positive for abdominal pain. Negative for nausea, vomiting, diarrhea, constipation, blood in stool and abdominal distention.       Lower abdomen  Genitourinary: Positive for dysuria, urgency and frequency. Negative for hematuria, flank pain, decreased urine volume, difficulty urinating and pelvic pain.  Musculoskeletal: Positive for myalgias and back pain.  Skin: Negative for rash.    Objective:  BP 122/88 mmHg  Pulse 72  Ht 5\' 7"  (1.702 m)  Wt 138 lb (62.596 kg)  BMI 21.61 kg/m2  SpO2 97%  Physical Exam  Constitutional: She is oriented to person, place, and time. She appears well-developed and well-nourished. No distress.  HENT:  Head: Normocephalic and atraumatic.  Right Ear: External ear normal.  Left Ear: External ear normal.  Pulmonary/Chest: Effort normal.  Abdominal: Soft. Bowel sounds are normal. She exhibits no distension and no mass. There is tenderness. There is no rebound and no guarding.  Lower abdominal mild tenderness with deep palpation  Genitourinary: No vaginal discharge found.  Bladder has prolapsed into the vaginal vault and is apparent immediately, easily retracted with speculum, no sign of discharge or bleeding, vaginal lubrication adequate  Neurological: She is alert and oriented to person, place, and time. No cranial nerve deficit. She exhibits normal muscle tone. Coordination normal.  Skin: Skin is warm and dry. No rash noted. She is not diaphoretic.  Psychiatric: She has a normal mood and affect. Her behavior is normal. Judgment and thought content normal.   Assessment &  Plan:   Nicole Swanson was seen today for abdominal pain.  Diagnoses and all orders for this visit:  Urinary frequency -     POCT Urinalysis Dipstick -     Ambulatory referral to Urology  Bladder prolapse, female, acquired -     Ambulatory referral to Urology   I have discontinued Ms. Olivera's doxycycline, doxycycline, and ergocalciferol. I am also having her maintain her aspirin, docusate sodium, Cetirizine HCl, polyethylene glycol-electrolytes, Lactulose, Misc Natural Products (OSTEO BI-FLEX TRIPLE STRENGTH PO), vitamin C, Cranberry (THERACRAN PO), Calcium Carb-Cholecalciferol, traMADol, Calcium Carb-Cholecalciferol (CALCIUM PLUS VITAMIN D3 PO), fexofenadine, HYDROcodone-acetaminophen, sotalol, montelukast, DEXILANT, and fluticasone.  No orders of the defined types were placed in this encounter.     Follow-up: Return if symptoms worsen or fail to improve.

## 2015-09-08 NOTE — Patient Instructions (Addendum)
We will contact you about your referral to urology for further care.   Work on [employer]  to strengthen lower pelvic muscles and help with leaking.

## 2015-09-15 ENCOUNTER — Other Ambulatory Visit: Payer: Self-pay | Admitting: Internal Medicine

## 2015-09-15 NOTE — Telephone Encounter (Signed)
Nicole Swanson,  Your office visit from jan 5 is incomplete:  No physical exam,  No A & P,  And I have been asked  to refill her zofran . Please finsih you note and tell me what your thoughts are.

## 2015-09-15 NOTE — Telephone Encounter (Signed)
Pt wants a new Rx for Zofran 8mg  for stomach pain and nausea, I spoke with pt in which she stated that she just saw Lorane Gell last week and is still having stomach pain and nausea which she believes is related to bladder issues. Please advise./tvw

## 2015-09-16 ENCOUNTER — Ambulatory Visit (INDEPENDENT_AMBULATORY_CARE_PROVIDER_SITE_OTHER): Payer: 59 | Admitting: Urology

## 2015-09-16 ENCOUNTER — Telehealth: Payer: Self-pay | Admitting: Urology

## 2015-09-16 ENCOUNTER — Encounter: Payer: Self-pay | Admitting: Urology

## 2015-09-16 ENCOUNTER — Encounter: Payer: Self-pay | Admitting: Nurse Practitioner

## 2015-09-16 ENCOUNTER — Other Ambulatory Visit: Payer: Self-pay

## 2015-09-16 VITALS — BP 125/74 | HR 65 | Ht 67.0 in | Wt 137.0 lb

## 2015-09-16 DIAGNOSIS — N8110 Cystocele, unspecified: Secondary | ICD-10-CM | POA: Diagnosis not present

## 2015-09-16 DIAGNOSIS — R350 Frequency of micturition: Secondary | ICD-10-CM | POA: Insufficient documentation

## 2015-09-16 LAB — URINALYSIS, COMPLETE
BILIRUBIN UA: NEGATIVE
Glucose, UA: NEGATIVE
KETONES UA: NEGATIVE
Nitrite, UA: NEGATIVE
PROTEIN UA: NEGATIVE
RBC UA: NEGATIVE
SPEC GRAV UA: 1.025 (ref 1.005–1.030)
UUROB: 2 mg/dL — AB (ref 0.2–1.0)
pH, UA: 7 (ref 5.0–7.5)

## 2015-09-16 LAB — MICROSCOPIC EXAMINATION: RBC, UA: NONE SEEN /hpf (ref 0–?)

## 2015-09-16 LAB — BLADDER SCAN AMB NON-IMAGING: SCAN RESULT: 85

## 2015-09-16 NOTE — Telephone Encounter (Signed)
Faxed office  Notes to [facility] on 09-16-15 for the UDS she has scheduled on 10-25-15 @ 2:15  She will follow up with him here in [place] on 11-11-15 @ 11:00  Patient has been notified of all the appointments   Sharyn Lull

## 2015-09-16 NOTE — Progress Notes (Signed)
09/16/2015 12:10 PM   Nicole Swanson 1955/12/03 [id number]  Referring provider: [name], MD [address], [place] [address]  Chief Complaint  Patient presents with  . New Patient (Initial Visit)    URINARY FREQUENCY, BLADDER PROLAPSE     HPI: The patient is a new patient with mild mixed stress and urge incontinence. She sometimes leaks with coughing sneezing bending lifting. She sometimes has urge incontinence and postvoid dribbling. She denies enuresis. She does not wear pads. She sometimes gets up once a night and voids every 2-3 hours.  She denies a history of previous GU surgery. She is on cranberry and no longer get urinary tract infections. She is on Estrace more than a year ago. She has no neurological resection symptoms. Bowel movements are normal. She has a pacemaker  Modifying factors: There are no other modifying factors  Associated signs and symptoms: There are no other associated signs and symptoms Aggravating and relieving factors: There are no other aggravating or relieving factors Severity: Mild Duration: Persistent     PMH: Past Medical History  Diagnosis Date  . Aborted cardiac arrest 1994  . DVT (deep vein thrombosis) in pregnancy 1986    had heparin shots followed by coumadin  . Dual implantable cardiac defibrillator     Medtronic initially implanted in 1997  . 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 Irritable bowel syndrome   . PVC's (premature ventricular contractions)   . Ventricular tachycardia ([place])   . Family history of seizures     Question neurological versus cardiac  . Allergy   . Heart murmur     Surgical History: Past Surgical History  Procedure Laterality Date  . Cardiac defibrillator placement  1994    Dual chamber with pacemaker  . Nasal septum surgery  [facility]    [facility]   . Abdominal hysterectomy    . Carpal tunnel release Right   . Laparoscopic lysis of adhesions    .  Appendectomy    . Pacemaker insertion      5 different devices  . Oophorectomy      Home Medications:    Medication List       This list is accurate as of: 09/16/15 12:10 PM.  Always use your most recent med list.               aspirin 81 MG tablet  Take 81 mg by mouth daily.     Calcium Carb-Cholecalciferol [phone] MG-UNIT Caps  Take by mouth.     CALCIUM PLUS VITAMIN D3 PO  Take by mouth daily. Reported on 09/08/2015     Cetirizine HCl 10 MG Caps  Take 10 mg by mouth daily.     DEXILANT 60 MG capsule  Generic drug:  dexlansoprazole  TAKE 1 CAPSULE BY MOUTH DAILY.     docusate sodium 250 MG capsule  Commonly known as:  COLACE  Take 250 mg by mouth 2 (two) times daily.     fexofenadine 180 MG tablet  Commonly known as:  ALLEGRA  Take 180 mg by mouth as needed for allergies or rhinitis.     fluticasone 50 MCG/ACT nasal spray  Commonly known as:  FLONASE  USE 1 SPRAY IN EACH NOSTRIL DAILY     HM GLUCOSAMINE & VITAMIN D3 Tabs  Take by mouth.     HYDROcodone-acetaminophen 10-325 MG tablet  Commonly known as:  NORCO  Take 1 tablet by mouth every 8 (  eight) hours as needed.     Lactulose 20 GM/30ML Soln  Take 30 mLs (20 g total) by mouth every 4 (four) hours as needed. Until constipation is relieved.     montelukast 10 MG tablet  Commonly known as:  SINGULAIR  TAKE 1 TABLET BY MOUTH AT BEDTIME.     ondansetron 8 MG tablet  Commonly known as:  ZOFRAN  TAKE 1 TABLET BY MOUTH EVERY 8 HOURS AS NEEDED FOR NAUSEA OR VOMITING.     OSTEO BI-FLEX TRIPLE STRENGTH PO  Take by mouth 2 (two) times daily.     polyethylene glycol-electrolytes 420 g solution  Commonly known as:  NuLYTELY/GoLYTELY  Drink 8 ounces of solution every 8 hours until constipation is relieved     sotalol 120 MG tablet  Commonly known as:  BETAPACE  TAKE 1 TABLET BY MOUTH 2 TIMES DAILY.     THERACRAN PO  Take 180 mg by mouth 2 (two) times daily. Reported on 09/08/2015     tiZANidine 4 MG  tablet  Commonly known as:  ZANAFLEX  Take by mouth. Reported on 09/16/2015     traMADol 50 MG tablet  Commonly known as:  ULTRAM  Take 2 tablets (100 mg total) by mouth every 6 (six) hours as needed.     vitamin C 500 MG tablet  Commonly known as:  ASCORBIC ACID  Take 500 mg by mouth 2 (two) times daily.        Allergies:  Allergies  Allergen Reactions  . Sulfa Antibiotics Diarrhea  . Penicillins Rash  . Vancomycin Rash    Family History: Family History  Problem Relation Age of Onset  . Heart disease Mother 73    A Fib, CHF  . Stroke Mother   . Heart disease Father   . Stroke Father   . Seizures Brother   . Seizures Daughter     Social History:  reports that she quit smoking about 20 years ago. Her smoking use included Cigarettes. She has never used smokeless tobacco. She reports that she drinks alcohol. She reports that she does not use illicit drugs.  ROS: UROLOGY Frequent Urination?: Yes Hard to postpone urination?: Yes Burning/pain with urination?: No Get up at night to urinate?: Yes Leakage of urine?: Yes Urine stream starts and stops?: Yes Trouble starting stream?: No Do you have to strain to urinate?: No Blood in urine?: No Urinary tract infection?: No Sexually transmitted disease?: No Injury to kidneys or bladder?: No Painful intercourse?: No Weak stream?: No Currently pregnant?: No Vaginal bleeding?: No Last menstrual period?: HYSTERECTOMY 1989  Gastrointestinal Nausea?: Yes Vomiting?: Yes Indigestion/heartburn?: Yes Diarrhea?: No Constipation?: Yes  Constitutional Fever: No Night sweats?: Yes Weight loss?: Yes Fatigue?: Yes  Skin Skin rash/lesions?: No Itching?: No  Eyes Blurred vision?: No Double vision?: No  Ears/Nose/Throat Sore throat?: No Sinus problems?: No  Hematologic/Lymphatic Swollen glands?: No Easy bruising?: Yes  Cardiovascular Leg swelling?: No Chest pain?: No  Respiratory Cough?: No Shortness of  breath?: Yes  Endocrine Excessive thirst?: No  Musculoskeletal Back pain?: Yes Joint pain?: Yes  Neurological Headaches?: No Dizziness?: No  Psychologic Depression?: No Anxiety?: No  Physical Exam: BP 125/74 mmHg  Pulse 65  Ht 5\' 7"  (1.702 m)  Wt 62.143 kg (137 lb)  BMI 21.45 kg/m2  Constitutional:  Alert and oriented, No acute distress. HEENT: [place] AT, moist mucus membranes.  Trachea midline, no masses. Cardiovascular: No clubbing, cyanosis, or edema. Respiratory: Normal respiratory effort, no increased work of breathing.  GI: Abdomen is soft, nontender, nondistended, no abdominal masses GU: . On pelvic examination the patient had mild hypermobility of the bladder neck no stress incontinence. She had a high grade 2 cystocele with a modest central defect Skin: No rashes, bruises or suspicious lesions. Lymph: No cervical or inguinal adenopathy. Neurologic: Grossly intact, no focal deficits, moving all 4 extremities. Psychiatric: Normal mood and affect.  Laboratory Data: Lab Results  Component Value Date   WBC 4.1 04/06/2015   WBC 4.1 04/06/2015   HGB 13.2 04/06/2015   HGB 13.2 04/06/2015   HCT 39.8 04/06/2015   HCT 39.5 04/06/2015   MCV 88.1 04/06/2015   MCV 87.6 04/06/2015   PLT 157.0 04/06/2015   PLT 156.0 04/06/2015    Lab Results  Component Value Date   CREATININE 0.80 04/06/2015    No results found for: PSA  No results found for: TESTOSTERONE  No results found for: HGBA1C  Urinalysis    Component Value Date/Time   COLORURINE YELLOW 07/02/2014 0945   APPEARANCEUR CLEAR 07/02/2014 0945   LABSPEC >=1.030* 07/02/2014 0945   PHURINE 5.5 07/02/2014 0945   GLUCOSEU NEGATIVE 07/02/2014 0945   HGBUR NEGATIVE 07/02/2014 0945   BILIRUBINUR neg 09/08/2015 1131   BILIRUBINUR NEGATIVE 07/02/2014 0945   KETONESUR NEGATIVE 07/02/2014 0945   PROTEINUR neg 09/08/2015 1131   UROBILINOGEN 0.2 09/08/2015 1131   UROBILINOGEN 0.2 07/02/2014 0945   NITRITE neg  09/08/2015 1131   NITRITE NEGATIVE 07/02/2014 0945   LEUKOCYTESUR Negative 09/08/2015 1131    Pertinent Imaging: None  Assessment & Plan:  The patient has mild mixed stress and urge incontinence. She has mild frequency. The patient feels mild vaginal bulging. Picture drawn. Conservative therapy discussed. Role urodynamics discussed.  The patient has some crampy feeling in the lower abdomen as her primary problem and she understands after drying her a picture better prolapse would not be the cause of this. She is also bothered by a tampon effect and her incontinence is minimal. I talked about physical therapy but she like to consider other options for her prolapse and urodynamics were ordered. She accordingly. I do not believe she has interstitial cystitis.  1. Urinary frequency 2. Mixed stress and urge incontinence - Urinalysis, Complete - Bladder Scan (Post Void Residual) in office  2. Bladder prolapse, female, acquired  - Urinalysis, Complete - Bladder Scan (Post Void Residual) in office    Reece Packer, MD  [facility] [address], [place], [place] [address] [phone]

## 2015-09-16 NOTE — Assessment & Plan Note (Addendum)
POCT urine Normal except for trace blood- did not get sent for culture Referred to urology for care due to symptoms and bladder prolapse

## 2015-09-16 NOTE — Telephone Encounter (Signed)
Please call pt and ask what is happening with her lower abdominal pain and the nausea. Any Vom/Diarrhea? Anything new? Keeping food and water down?

## 2015-09-16 NOTE — Assessment & Plan Note (Signed)
New onset Bladder has prolapsed and is causing pt discomfort and symptoms Kegel exercise sheet given to pt Referred to Urology

## 2015-09-16 NOTE — Progress Notes (Signed)
Bladder Scan Patient} void: 36ml Performed By: Raliegh Ip.Leshonda Galambos,CMA

## 2015-09-19 NOTE — Telephone Encounter (Signed)
Patient notified to pick up Rx.

## 2015-09-23 ENCOUNTER — Other Ambulatory Visit: Payer: Self-pay

## 2015-09-23 DIAGNOSIS — N3946 Mixed incontinence: Secondary | ICD-10-CM

## 2015-09-23 DIAGNOSIS — N8110 Cystocele, unspecified: Secondary | ICD-10-CM

## 2015-10-03 ENCOUNTER — Ambulatory Visit: Payer: 59 | Attending: Urology | Admitting: Physical Therapy

## 2015-10-03 VITALS — BP 130/80

## 2015-10-03 DIAGNOSIS — R293 Abnormal posture: Secondary | ICD-10-CM | POA: Diagnosis not present

## 2015-10-03 DIAGNOSIS — M79672 Pain in left foot: Secondary | ICD-10-CM | POA: Diagnosis not present

## 2015-10-03 DIAGNOSIS — M79671 Pain in right foot: Secondary | ICD-10-CM | POA: Diagnosis not present

## 2015-10-03 DIAGNOSIS — N8189 Other female genital prolapse: Secondary | ICD-10-CM | POA: Insufficient documentation

## 2015-10-03 DIAGNOSIS — M629 Disorder of muscle, unspecified: Secondary | ICD-10-CM | POA: Diagnosis not present

## 2015-10-03 DIAGNOSIS — R279 Unspecified lack of coordination: Secondary | ICD-10-CM | POA: Insufficient documentation

## 2015-10-03 NOTE — Patient Instructions (Signed)
   Diaphragmatic breathing inhale 1-2-3 pause, exhale 3-2-1 pause (10 reps)  You are now ready to begin training the deep core muscles system: diaphragm, transverse abdominis, pelvic floor . These muscles must work together as a team.     _  _________________________________________________________________   Technique for Scar Tissue Massage  As soon as the wound is knitted, massage therapy can be performed. You will be most successful if you work on scar tissue yourself (if you can reach it). During the initial immature phrase, it is important that a gentle approach be taken. The following  techniques are well-known ways that can improve scar tissue: 1. Myofascial Release helps ease constriction of the affected tissue.  Use olive oil / coconut oil - 58min / each night after diaphragmatic breathing exercise       Stretch the skin next to the scar. Place two or three fingers at the beginning of the scar and stretch the skin above the scar in a perpendicular direction. Then move the fingers 6 mm further along the scar, and repeat the stretch all the way along the scar.  ______________________________________________________________                                                [facility] the function of your pelvic floor, abdomen, and back.              Avoid decreased straining of abdominal/pelvic floor muscles with less  slouching,  holding your breath with lifting/bowel movements)                                                     FUNCTIONAL POSTURES

## 2015-10-04 ENCOUNTER — Ambulatory Visit (INDEPENDENT_AMBULATORY_CARE_PROVIDER_SITE_OTHER): Payer: 59 | Admitting: Sports Medicine

## 2015-10-04 ENCOUNTER — Ambulatory Visit (INDEPENDENT_AMBULATORY_CARE_PROVIDER_SITE_OTHER): Payer: 59

## 2015-10-04 ENCOUNTER — Encounter: Payer: Self-pay | Admitting: Sports Medicine

## 2015-10-04 DIAGNOSIS — M79672 Pain in left foot: Secondary | ICD-10-CM

## 2015-10-04 DIAGNOSIS — M21619 Bunion of unspecified foot: Secondary | ICD-10-CM | POA: Diagnosis not present

## 2015-10-04 DIAGNOSIS — R29898 Other symptoms and signs involving the musculoskeletal system: Secondary | ICD-10-CM | POA: Diagnosis not present

## 2015-10-04 DIAGNOSIS — M779 Enthesopathy, unspecified: Secondary | ICD-10-CM | POA: Diagnosis not present

## 2015-10-04 DIAGNOSIS — L909 Atrophic disorder of skin, unspecified: Secondary | ICD-10-CM

## 2015-10-04 MED ORDER — TRIAMCINOLONE ACETONIDE 10 MG/ML IJ SUSP: 10.0000 mg | Freq: Once | INTRAMUSCULAR | Status: DC

## 2015-10-04 MED ORDER — MELOXICAM 15 MG PO TABS: 15.0000 mg | ORAL_TABLET | Freq: Every day | ORAL | Status: DC

## 2015-10-04 NOTE — Therapy (Addendum)
[place] MAIN [facility] SERVICES [address] [place], [place], [address] Phone: [phone]   Fax:  [phone]  Physical Therapy Evaluation  Patient Details  Name: Nicole Swanson MRN: [id number] Date of Birth: 1956-01-22 Referring Provider: Matilde Sprang  Encounter Date: 10/03/2015      PT End of Session - 10/05/15 1243    Visit Number 1   Number of Visits 12   Date for PT Re-Evaluation 12/07/15   PT Start Time 1105   PT Stop Time 1215   PT Time Calculation (min) 70 min   Activity Tolerance Patient tolerated treatment well;No increased pain   Behavior During Therapy [facility] for tasks assessed/performed      Past Medical History  Diagnosis Date  . Aborted cardiac arrest 1994  . DVT (deep vein thrombosis) in pregnancy 1986    had heparin shots followed by coumadin  . Dual implantable cardiac defibrillator     Medtronic initially implanted in 1997  . 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 Irritable bowel syndrome   . PVC's (premature ventricular contractions)   . Ventricular tachycardia ([place])   . Family history of seizures     Question neurological versus cardiac  . Allergy   . Heart murmur     Past Surgical History  Procedure Laterality Date  . Cardiac defibrillator placement  1994    Dual chamber with pacemaker  . Nasal septum surgery  2006    [facility]   . Abdominal hysterectomy    . Carpal tunnel release Right   . Laparoscopic lysis of adhesions    . Appendectomy    . Pacemaker insertion      5 different devices  . Oophorectomy      Filed Vitals:   10/03/15 1117  BP: 130/80    Visit Diagnosis:  Fascial defect - Plan: PT plan of care cert/re-cert  Pelvic floor weakness - Plan: PT plan of care cert/re-cert  Poor posture - Plan: PT plan of care cert/re-cert  Lack of coordination - Plan: PT plan of care cert/re-cert  Pain in both feet - Plan: PT plan of care cert/re-cert          [facility] PT  Assessment - 10/05/15 1203    Assessment   Medical Diagnosis Mixed Incontinence   Referring Provider MacDiarmid   Precautions   Precautions None   Restrictions   Weight Bearing Restrictions No   Balance Screen   Has the patient fallen in the past 6 months No   Observation/Other Assessments   Observations legs crossed, feet under chair   Other Surveys  --  ODI 16%, PFDI 46%   Coordination   Gross Motor Movements are Fluid and Coordinated --  diaphragmatic breathing w/ minor cuing    Fine Motor Movements are Fluid and Coordinated --  abdominal straining w/ cue for bowel movement   Single Leg Stance   Comments L 3 sec, R 6 sec    Posture/Postural Control   Posture Comments lumbopelvic instability w/ hip flexion from supine   Strength   Overall Strength Comments hip flexion bilat 4-/5, Hip IR, ER 4'-/5    Palpation   Palpation comment abdominal scar with decreased mobility    Transfers   Five time sit to stand comments  breathholding                    [facility] Adult PT Treatment/Exercise - 10/05/15 1203    Bed Mobility  Bed Mobility --  log rolling technique w.o cuing    Self-Care   Self-Care --  POC, goals, anatomy and physiology,    Neuro Re-ed    Neuro Re-ed Details  proper toileing mechanics, sitting posture , exhale with sit to stand   Manual Therapy   Myofascial Release scar massage, also guided pt self-massage                      PT Long Term Goals - 10/05/15 1209    PT LONG TERM GOAL #1   Title Pt will decrease her ODI score from 16% to < 10% in order to return to ADLs.   Time 12   Period Weeks   Status New   PT LONG TERM GOAL #2   Title Pt will decrease her PFDI score from 46% to < 36% in order optimize pelvic floor function for bowel and urinarion.   Time 12   Period Weeks   Status New   PT LONG TERM GOAL #3   Title Pt will demo increased scar mobility over abdomen in order o decrease pain and perform hip flexion without wincing.     Time 12   Period Weeks   Status New   PT LONG TERM GOAL #4   Title Pt will demo 5/5 hip flexion bilaterally in order to walk and perform sit to stand efficiently.    Time 12   Period Weeks   Status New   PT LONG TERM GOAL #5   Title Pt will demo no lumnbopelvic instability with deep core level 1-4 (5 reps ) in order to improve balance and withstand load bearing forces through spine with carrying groceries.   Time 12   Period Weeks   Status New   Additional Long Term Goals   Additional Long Term Goals Yes   PT LONG TERM GOAL #6   Title Pt will demo increased SLS time bilaterally to > 30 sec to minimize risk for falls.    Time 12   Period Weeks   Status New   PT LONG TERM GOAL #7   Title Pt will report no feet pain when getting out of bed and stepping on to the floor in order to transfer safely.   Time 12   Period Weeks   Status New               Plan - 10/05/15 1215    Clinical Impression Statement Pt is a 60 yo female whose complaints include sensations of prolapse, leakage and incomplete emptying w/ urination, constipation, LBP w/ radiculopathy, and lateral feet pain. 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 Pt's clinical presentations include abdominal scars, poor deep core strength, poor body mechanics/ posture that place downward forces onto abdominal wall and pelvic floor, weak hip mm strength, and poor balance.  Pt's personal factors include mutliple abdominal surgeries, sedentary job, high anxiety, Hx of CLBP, IBS, and poor toileting habits. With these factors, pt's condition is higly complex and evolving in nature.       Pt will benefit from skilled therapeutic intervention in order to improve on the following deficits Decreased strength;Increased muscle spasms;Improper body mechanics;Difficulty walking;Decreased activity tolerance;Decreased balance;Decreased coordination;Decreased endurance;Decreased scar mobility;Increased fascial  restricitons;Pain;Decreased safety awareness;Decreased range of motion;Decreased mobility;Impaired flexibility;Postural dysfunction   Rehab Potential Good   PT Frequency 1x / week   PT Duration 12 weeks   PT Treatment/Interventions ADLs/Self Care Home Management;Aquatic Therapy;Biofeedback;Cryotherapy;Electrical Stimulation;Moist Heat;Traction;Patient/family education;Neuromuscular re-education;Balance  training;Therapeutic exercise;Therapeutic activities;Stair training;Gait training;Functional mobility training;Manual techniques;Scar mobilization;Passive range of motion;Energy conservation;Taping   Consulted and Agree with Plan of Care Patient         Problem List Patient Active Problem List   Diagnosis Date Noted  . Urinary frequency 09/16/2015  . Bladder prolapse, female, acquired 09/08/2015  . DDD (degenerative disc disease), lumbar 01/25/2015  . Sacroiliac joint dysfunction of both sides 01/25/2015  . Spinal stenosis, lumbar region, with neurogenic claudication 01/25/2015  . Facet syndrome, lumbar 01/25/2015  . Lumbar radiculopathy 01/25/2015  . Gallstones 12/23/2014  . Polyarthritis 03/16/2014  . Thrombocytopenia ([place]) 09/11/2013  . Other malaise and fatigue 09/10/2013  . Major depressive disorder, single episode 09/10/2013  . Raynaud phenomenon 04/17/2013  . Low back pain 12/05/2012  . Shortness of breath 06/30/2012  . Aborted cardiac arrest   . ICD-Medtronic   . PVC's / ventricular tachycardia   . 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 Irritable bowel syndrome     [name] ,PT, DPT, E-RYT  10/05/2015, 12:44 PM  [place] MAIN [facility] SERVICES [address] [place], [place], [address] Phone: [phone]   Fax:  [phone]  Name: Nicole Swanson MRN: [id number] Date of Birth: 08-Sep-1955

## 2015-10-04 NOTE — Progress Notes (Signed)
Patient ID: Nicole Swanson, female   DOB: 04/15/1956, 60 y.o.   MRN: [id number] Subjective: Nicole Swanson is a 60 y.o. female patient who presents to office for evaluation of Left foot pain. Patient complains of progressive pain especially over the last few weeks at the top and under surface of the left foot; states that it hurts when she walks; tried biofreeze with no relief. Patient denies any other pedal complaints. Denies injury/trip/fall/sprain/any causative factors.   Patient Active Problem List   Diagnosis Date Noted  . Urinary frequency 09/16/2015  . Bladder prolapse, female, acquired 09/08/2015  . DDD (degenerative disc disease), lumbar 01/25/2015  . Sacroiliac joint dysfunction of both sides 01/25/2015  . Spinal stenosis, lumbar region, with neurogenic claudication 01/25/2015  . Facet syndrome, lumbar 01/25/2015  . Lumbar radiculopathy 01/25/2015  . Gallstones 12/23/2014  . Polyarthritis 03/16/2014  . Thrombocytopenia ([place]) 09/11/2013  . Other malaise and fatigue 09/10/2013  . Major depressive disorder, single episode 09/10/2013  . Raynaud phenomenon 04/17/2013  . Low back pain 12/05/2012  . Shortness of breath 06/30/2012  . Aborted cardiac arrest   . ICD-Medtronic   . PVC's / ventricular tachycardia   . 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 Irritable bowel syndrome    Current Outpatient Prescriptions on File Prior to Visit  Medication Sig Dispense Refill  . aspirin 81 MG tablet Take 81 mg by mouth daily.      . [place]-Glucosamine-Vit D (HM GLUCOSAMINE & VITAMIN D3) TABS Take by mouth.    . Calcium Carb-Cholecalciferol (CALCIUM PLUS VITAMIN D3 PO) Take by mouth daily. Reported on 09/08/2015    . Calcium Carb-Cholecalciferol [id number] MG-UNIT CAPS Take by mouth.    . Cetirizine HCl 10 MG CAPS Take 10 mg by mouth daily.    . Cranberry (THERACRAN PO) Take 180 mg by mouth 2 (two) times daily. Reported on 09/08/2015    . DEXILANT 60 MG capsule TAKE 1 CAPSULE BY  MOUTH DAILY. 30 capsule 7  . docusate sodium (COLACE) 250 MG capsule Take 250 mg by mouth 2 (two) times daily.    . fexofenadine (ALLEGRA) 180 MG tablet Take 180 mg by mouth as needed for allergies or rhinitis.    . fluticasone (FLONASE) 50 MCG/ACT nasal spray USE 1 SPRAY IN EACH NOSTRIL DAILY 16 g 5  . HYDROcodone-acetaminophen (NORCO) 10-325 MG per tablet Take 1 tablet by mouth every 8 (eight) hours as needed. (Patient not taking: Reported on 09/16/2015) 90 tablet 0  . Lactulose 20 GM/30ML SOLN Take 30 mLs (20 g total) by mouth every 4 (four) hours as needed. Until constipation is relieved. 240 mL 1  . Misc Natural Products (OSTEO BI-FLEX TRIPLE STRENGTH PO) Take by mouth 2 (two) times daily.     . montelukast (SINGULAIR) 10 MG tablet TAKE 1 TABLET BY MOUTH AT BEDTIME. 90 tablet 1  . ondansetron (ZOFRAN) 8 MG tablet TAKE 1 TABLET BY MOUTH EVERY 8 HOURS AS NEEDED FOR NAUSEA OR VOMITING. 20 tablet 0  . polyethylene glycol-electrolytes (NULYTELY/GOLYTELY) 420 G solution Drink 8 ounces of solution every 8 hours until constipation is relieved (Patient not taking: Reported on 09/16/2015) 4000 mL 0  . sotalol (BETAPACE) 120 MG tablet TAKE 1 TABLET BY MOUTH 2 TIMES DAILY. 180 tablet 3  . tiZANidine (ZANAFLEX) 4 MG tablet Take by mouth. Reported on 10/03/2015    . traMADol (ULTRAM) 50 MG tablet Take 2 tablets (100 mg total) by mouth every 6 (six) hours as needed. (  Patient not taking: Reported on 09/16/2015) 180 tablet 2  . vitamin C (ASCORBIC ACID) 500 MG tablet Take 500 mg by mouth 2 (two) times daily.     No current facility-administered medications on file prior to visit.   Allergies  Allergen Reactions  . Sulfa Antibiotics Diarrhea  . Penicillins Rash  . Vancomycin Rash     Objective:  General: Alert and oriented x3 in no acute distress  Dermatology: No open lesions bilateral lower extremities, no webspace macerations, no ecchymosis bilateral, all nails x 10 are well manicured.  Vascular:  Dorsalis Pedis and Posterior Tibial pedal pulses 1/4, Capillary Fill Time 3 seconds,(+) pedal hair growth bilateral, Temperature gradient within normal limits.  Neurology: Gross sensation intact via light touch bilateral, Protective sensation intact  with Semmes Weinstein Monofilament to all pedal sites, Position sense intact, vibratory intact bilateral, Deep tendon reflexes within normal limits bilateral, No babinski sign present bilateral. (- )Tinels sign or moulders sign left foot.   Musculoskeletal: Mild tenderness with palpation at 2-4 MTPJ dorsal >plantar on Left,No pain with calf compression bilateral. Ankle and pedal joint range of motion is within normal limits, there is mild bunion and distal fat pad migration L>R. Strength within normal limits in all groups bilateral.   Xrays  Left Foot     Impression: Normal osseous mineralization, mild bunion and posterior and inferior heel spur, No acute fracture/dislocation. No other acute findings.   Assessment and Plan: Problem List Items Addressed This Visit    None    Visit Diagnoses    Left foot pain    -  Primary    Relevant Medications    meloxicam (MOBIC) 15 MG tablet    Other Relevant Orders    DG Foot 2 Views Left    Capsulitis        Relevant Medications    meloxicam (MOBIC) 15 MG tablet    triamcinolone acetonide (KENALOG) 10 MG/ML injection 10 mg    Bunion        Fat pad atrophy of foot           -Complete examination performed -Xrays reviewed -Discussed treatement options for likely capsulitis secondary to foot structure -After oral consent and aseptic prep, injected a mixture containing 1 ml of 2%  plain lidocaine, 1 ml 0.5% plain marcaine, 0.5 ml of kenalog 10 and 0.5 ml of dexamethasone phosphate into Left 2-4 MTPJs Post-injection care discussed with patient.  -Gave met pad and instructed on use to offload area on left foot -Recommend cont with good supportive shoes and custom inserts daily -Recommend icing 2x daily  and Mobic which was Rx at today's encounter -Patient to return to office in 4 weeks or sooner if condition worsens.  Landis Martins, DPM

## 2015-10-05 NOTE — Addendum Note (Signed)
Addended by: [name] on: 10/05/2015 12:47 PM   Modules accepted: Orders

## 2015-10-06 ENCOUNTER — Ambulatory Visit (INDEPENDENT_AMBULATORY_CARE_PROVIDER_SITE_OTHER): Payer: 59 | Admitting: *Deleted

## 2015-10-06 DIAGNOSIS — I469 Cardiac arrest, cause unspecified: Secondary | ICD-10-CM

## 2015-10-06 DIAGNOSIS — I493 Ventricular premature depolarization: Secondary | ICD-10-CM | POA: Diagnosis not present

## 2015-10-06 NOTE — Progress Notes (Signed)
Remote ICD transmission.   

## 2015-10-12 ENCOUNTER — Encounter: Payer: 59 | Admitting: Physical Therapy

## 2015-10-14 ENCOUNTER — Ambulatory Visit: Payer: 59 | Attending: Urology | Admitting: Physical Therapy

## 2015-10-14 DIAGNOSIS — M79671 Pain in right foot: Secondary | ICD-10-CM | POA: Diagnosis not present

## 2015-10-14 DIAGNOSIS — R293 Abnormal posture: Secondary | ICD-10-CM | POA: Diagnosis not present

## 2015-10-14 DIAGNOSIS — N8189 Other female genital prolapse: Secondary | ICD-10-CM | POA: Diagnosis not present

## 2015-10-14 DIAGNOSIS — M629 Disorder of muscle, unspecified: Secondary | ICD-10-CM | POA: Insufficient documentation

## 2015-10-14 DIAGNOSIS — M79672 Pain in left foot: Secondary | ICD-10-CM | POA: Insufficient documentation

## 2015-10-14 DIAGNOSIS — R279 Unspecified lack of coordination: Secondary | ICD-10-CM | POA: Diagnosis not present

## 2015-10-14 NOTE — Therapy (Addendum)
[place] MAIN [facility] SERVICES [address] [place], [place], [address] Phone: [phone]   Fax:  [phone]  Physical Therapy Treatment  Patient Details  Name: Nicole Swanson MRN: [id number] Date of Birth: 1956/07/15 Referring Provider: Matilde Sprang  Encounter Date: 10/14/2015      PT End of Session - 10/14/15 2347    Visit Number 2   Number of Visits 12   Date for PT Re-Evaluation 12/07/15   PT Start Time 1100   PT Stop Time 1200   PT Time Calculation (min) 60 min   Activity Tolerance Patient tolerated treatment well;No increased pain   Behavior During Therapy [facility] for tasks assessed/performed      Past Medical History  Diagnosis Date  . Aborted cardiac arrest 1994  . DVT (deep vein thrombosis) in pregnancy 1986    had heparin shots followed by coumadin  . Dual implantable cardiac defibrillator     Medtronic initially implanted in 1997  . 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 Irritable bowel syndrome   . PVC's (premature ventricular contractions)   . Ventricular tachycardia ([place])   . Family history of seizures     Question neurological versus cardiac  . Allergy   . Heart murmur     Past Surgical History  Procedure Laterality Date  . Cardiac defibrillator placement  1994    Dual chamber with pacemaker  . Nasal septum surgery  2006    [facility]   . Abdominal hysterectomy    . Carpal tunnel release Right   . Laparoscopic lysis of adhesions    . Appendectomy    . Pacemaker insertion      5 different devices  . Oophorectomy      There were no vitals filed for this visit.  Visit Diagnosis:  Pelvic floor weakness  Fascial defect  Poor posture  Lack of coordination  Pain in both feet      Subjective Assessment - 10/14/15 1104    Subjective Pt reported she has been aware of her sitting posture.    Pertinent History Hx; cardiac conditions, Hx of abdominal surgeries,    Patient Stated Goals  relief from prolapse, LBP, feet pain and to gain stabilty in core             [facility] PT Assessment - 10/14/15 2341    Palpation   Palpation comment R abdominal scar with increased restriction compared to L                   Pelvic Floor Special Questions - 10/14/15 0001    Prolapse other bladder within introitus but lowered position, slight lift w/ exhalation   Pelvic Floor Internal Exam pt consented verbally without contraindications    Exam Type Vaginal   Sensation sharp and stinging on R 2nd and 3 rd layers (bulbospongiosus, ischiococcygeus, ilio, obt int   Palpation increased tensions note on R, (post-Tx: decreased)   Biofeedback contraction less strong without cue for exhale                   PT Education - 10/14/15 2346    Education provided Yes   Education Details HEP,  educated principles of pelvic floor mechanics and role of scar adhesions, rationale for withhold kegels    Person(s) Educated Patient   Methods Explanation;Demonstration;Tactile cues;Verbal cues;Handout   Comprehension Returned demonstration;Verbalized understanding             PT Long  Term Goals - 10/05/15 1209    PT LONG TERM GOAL #1   Title Pt will decrease her ODI score from 16% to < 10% in order to return to ADLs.   Time 12   Period Weeks   Status New   PT LONG TERM GOAL #2   Title Pt will decrease her PFDI score from 46% to < 36% in order optimize pelvic floor function for bowel and urinarion.   Time 12   Period Weeks   Status New   PT LONG TERM GOAL #3   Title Pt will demo increased scar mobility over abdomen in order o decrease pain and perform hip flexion without wincing.    Time 12   Period Weeks   Status New   PT LONG TERM GOAL #4   Title Pt will demo 5/5 hip flexion bilaterally in order to walk and perform sit to stand efficiently.    Time 12   Period Weeks   Status New   PT LONG TERM GOAL #5   Title Pt will demo no lumnbopelvic instability with deep  core level 1-4 (5 reps ) in order to improve balance and withstand load bearing forces through spine with carrying groceries.   Time 12   Period Weeks   Status New   Additional Long Term Goals   Additional Long Term Goals Yes   PT LONG TERM GOAL #6   Title Pt will demo increased SLS time bilaterally to > 30 sec to minimize risk for falls.    Time 12   Period Weeks   Status New   PT LONG TERM GOAL #7   Title Pt will report no feet pain when getting out of bed and stepping on to the floor in order to transfer safely.   Time 12   Period Weeks   Status New               Plan - 10/14/15 2347    Clinical Impression Statement Pt showed increased mm tensions on R pelvic floor mm and increased scar restrictions on R abdominal scar compared to L. Withholding endurance training until pelvic tensions decrease and normal ROM is restored. Pt showed improvement with manual Tx and had no complaints  postTx. Pt will continue to benefit skilled PT.   Pt will benefit from skilled therapeutic intervention in order to improve on the following deficits Decreased strength;Increased muscle spasms;Improper body mechanics;Difficulty walking;Decreased activity tolerance;Decreased balance;Decreased coordination;Decreased endurance;Decreased scar mobility;Increased fascial restricitons;Pain;Decreased safety awareness;Decreased range of motion;Decreased mobility;Impaired flexibility;Postural dysfunction   Rehab Potential Good   PT Frequency 1x / week   PT Duration 12 weeks   PT Treatment/Interventions ADLs/Self Care Home Management;Aquatic Therapy;Biofeedback;Cryotherapy;Electrical Stimulation;Moist Heat;Traction;Patient/family education;Neuromuscular re-education;Balance training;Therapeutic exercise;Therapeutic activities;Stair training;Gait training;Functional mobility training;Manual techniques;Scar mobilization;Passive range of motion;Energy conservation;Taping   Consulted and Agree with Plan of Care Patient         Problem List Patient Active Problem List   Diagnosis Date Noted  . Urinary frequency 09/16/2015  . Bladder prolapse, female, acquired 09/08/2015  . DDD (degenerative disc disease), lumbar 01/25/2015  . Sacroiliac joint dysfunction of both sides 01/25/2015  . Spinal stenosis, lumbar region, with neurogenic claudication 01/25/2015  . Facet syndrome, lumbar 01/25/2015  . Lumbar radiculopathy 01/25/2015  . Gallstones 12/23/2014  . Polyarthritis 03/16/2014  . Thrombocytopenia ([place]) 09/11/2013  . Other malaise and fatigue 09/10/2013  . Major depressive disorder, single episode 09/10/2013  . Raynaud phenomenon 04/17/2013  . Low back pain 12/05/2012  . Shortness  of breath 06/08/2012  . Aborted cardiac arrest   . ICD-Medtronic   . PVC's / ventricular tachycardia   . 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 Irritable bowel syndrome     [name] ,PT, DPT, E-RYT  10/14/2015, 11:51 PM  [place] MAIN [facility] SERVICES [address] [place], [place], [address] Phone: [phone]   Fax:  [phone]  Name: Nicole Swanson MRN: [id number] Date of Birth: 10-Jan-1956

## 2015-10-14 NOTE — Patient Instructions (Signed)
  Scar massage (Handout)    Pre masssage ROM (Handout)  Deep core 2 inserted into Pre massage ROM (Handout)

## 2015-10-17 ENCOUNTER — Ambulatory Visit: Payer: 59 | Admitting: Physical Therapy

## 2015-10-17 DIAGNOSIS — M79672 Pain in left foot: Secondary | ICD-10-CM

## 2015-10-17 DIAGNOSIS — N8189 Other female genital prolapse: Secondary | ICD-10-CM | POA: Diagnosis not present

## 2015-10-17 DIAGNOSIS — M629 Disorder of muscle, unspecified: Secondary | ICD-10-CM | POA: Diagnosis not present

## 2015-10-17 DIAGNOSIS — R279 Unspecified lack of coordination: Secondary | ICD-10-CM

## 2015-10-17 DIAGNOSIS — R293 Abnormal posture: Secondary | ICD-10-CM | POA: Diagnosis not present

## 2015-10-17 DIAGNOSIS — M79671 Pain in right foot: Secondary | ICD-10-CM

## 2015-10-17 NOTE — Patient Instructions (Addendum)
  Brainstorm on ways to minimize heavy lifting with church task to minimize worsening of symptoms until you have worked with PT on lifting later in rehab process Abdominal massage (handout)  Level 1  30 reps x 2 day with pillow under hips   You are now ready to begin training the deep core muscles system: diaphragm, transverse abdominis, pelvic floor . These muscles must work together as a team.           The key to these exercises to train the brain to coordinate the timing of these muscles and to have them turn on for long periods of time to hold you upright against gravity (especially important if you are on your feet all day).These muscles are postural muscles and play a role stabilizing your spine and bodyweight. By doing these repetitions slowly and correctly instead of doing crunches, you will achieve a flatter belly without a lower pooch. You are also placing your spine in a more neutral position and breathing properly which in turn, decreases your risk for problems related to your pelvic floor, abdominal, and low back such as pelvic organ prolapse, hernias, diastasis recti (separation of superficial muscles), disk herniations, spinal fractures. These exercises set a solid foundation for you to later progress to resistance/ strength training with therabands and weights and return to other typical fitness exercises with a stronger deeper core.

## 2015-10-18 NOTE — Therapy (Addendum)
[place] MAIN [facility] SERVICES [address] [place], [place], [address] Phone: [phone]   Fax:  [phone]  Physical Therapy Treatment  Patient Details  Name: Nicole Swanson MRN: [id number] Date of Birth: [birth year] Referring Provider: Matilde Sprang  Encounter Date: 10/17/2015      PT End of Session - 10/18/15 2155    Visit Number 3   Number of Visits 12   Date for PT Re-Evaluation 12/07/15   PT Start Time 1300   PT Stop Time 0174   PT Time Calculation (min) 55 min      Past Medical History  Diagnosis Date  . Aborted cardiac arrest 1994  . DVT (deep vein thrombosis) in pregnancy 1986    had heparin shots followed by coumadin  . Dual implantable cardiac defibrillator     Medtronic initially implanted in 1997  . 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 Irritable bowel syndrome   . PVC's (premature ventricular contractions)   . Ventricular tachycardia ([place])   . Family history of seizures     Question neurological versus cardiac  . Allergy   . Heart murmur     Past Surgical History  Procedure Laterality Date  . Cardiac defibrillator placement  1994    Dual chamber with pacemaker  . Nasal septum surgery  2006    [facility]   . Abdominal hysterectomy    . Carpal tunnel release Right   . Laparoscopic lysis of adhesions    . Appendectomy    . Pacemaker insertion      5 different devices  . Oophorectomy      There were no vitals filed for this visit.  Visit Diagnosis:  Pelvic floor weakness  Fascial defect  Lack of coordination  Poor posture  Pain in both feet          [facility] PT Assessment - 10/18/15 2143    Palpation   Palpation comment decreased abdominal scar restrictions, slightly increased near surrounding area                  Pelvic Floor Special Questions - 10/18/15 2143    Pelvic Floor Internal Exam pt consented verbally without contraindications    Exam Type Vaginal    Sensation no tenderness nor tensions    Strength fair squeeze, definite lift  with pillow under hips   Biofeedback circumferential contraction w/ pillow under hips           [facility] Adult PT Treatment/Exercise - 10/18/15 2144    Therapeutic Activites    Lifting education on heavy lifting on Sx, brainstormed ways to minimize lifting until further progress with rehab     Neuro Re-ed    Neuro Re-ed Details  deep core level 1 w/ pillow under hips, standing posture cues from lower kinetic chain up to pelvis, ribs, shoulders neck    pelvic floor coordination   Manual Therapy   Myofascial Release scar massage, abdominal masage                PT Education - 10/18/15 2204    Education provided Yes   Education Details HEP, ways to minimize worsening of symptoms   Person(s) Educated Patient   Methods Explanation;Demonstration;Tactile cues;Verbal cues;Handout   Comprehension Returned demonstration;Verbalized understanding             PT Long Term Goals - 10/18/15 2211    PT LONG TERM GOAL #1   Title Pt will decrease her  ODI score from 16% to < 10% in order to return to ADLs.   Time 12   Period Weeks   Status On-going   PT LONG TERM GOAL #2   Title Pt will decrease her PFDI score from 46% to < 36% in order optimize pelvic floor function for bowel and urinarion.   Time 12   Period Weeks   Status On-going   PT LONG TERM GOAL #3   Title Pt will demo increased scar mobility over abdomen in order o decrease pain and perform hip flexion without wincing.    Time 12   Period Weeks   Status Partially Met   PT LONG TERM GOAL #4   Title Pt will demo 5/5 hip flexion bilaterally in order to walk and perform sit to stand efficiently.    Time 12   Period Weeks   Status On-going   PT LONG TERM GOAL #5   Title Pt will demo no lumbopelvic instability with deep core level 1-4 (5 reps ) in order to improve balance and withstand load bearing forces through spine with carrying groceries.    Time 12   Period Weeks   Status On-going   Additional Long Term Goals   Additional Long Term Goals Yes   PT LONG TERM GOAL #6   Title Pt will demo increased SLS time bilaterally to > 30 sec to minimize risk for falls.    Time 12   Period Weeks   Status On-going   PT LONG TERM GOAL #7   Title Pt will report no feet pain when getting out of bed and stepping on to the floor in order to transfer safely.   Time 12   Period Weeks   Status On-going   PT LONG TERM GOAL #8   Title Pt willl demo Grade 3/5/5/5 and more caudal position of bladder without hips elevated under hips in order to demo increased fascial tensigrity and pelvic floor strength to minimize worsening of prolapse symptoms.    Time 12   Period Weeks   Status New               Plan - 10/18/15 2156    Clinical Impression Statement Pt reports at her 3rd visit that she is starting to notice less prolapse-related symptoms. Pt demo'd good carry over with proper sitting posture without cuing.  She responded well to abdominal scar release and intravaginal manual therapy the past visits as pt showed no tenderness/tensions in R pelvic floor today. Pt was able to demo complete circumferential closure of pelvic floor mm with bladder in more caudal position with pillow propped under hips and  neuro-muscular  re training. Discussed ways to minimize heavy lifting with volunteer church activities in order to minimize worsening of pelvic floor dysfunction. Pt will continue to benefit from skilled PT.    Pt will benefit from skilled therapeutic intervention in order to improve on the following deficits Decreased strength;Increased muscle spasms;Improper body mechanics;Difficulty walking;Decreased activity tolerance;Decreased balance;Decreased coordination;Decreased endurance;Decreased scar mobility;Increased fascial restricitons;Pain;Decreased safety awareness;Decreased range of motion;Decreased mobility;Impaired flexibility;Postural  dysfunction   Rehab Potential Good   PT Frequency 1x / week   PT Duration 12 weeks   PT Treatment/Interventions ADLs/Self Care Home Management;Aquatic Therapy;Biofeedback;Cryotherapy;Electrical Stimulation;Moist Heat;Traction;Patient/family education;Neuromuscular re-education;Balance training;Therapeutic exercise;Therapeutic activities;Stair training;Gait training;Functional mobility training;Manual techniques;Scar mobilization;Passive range of motion;Energy conservation;Taping   Consulted and Agree with Plan of Care Patient        Problem List Patient Active Problem List   Diagnosis Date  Noted  . Urinary frequency 09/16/2015  . Bladder prolapse, female, acquired 09/08/2015  . DDD (degenerative disc disease), lumbar 01/25/2015  . Sacroiliac joint dysfunction of both sides 01/25/2015  . Spinal stenosis, lumbar region, with neurogenic claudication 01/25/2015  . Facet syndrome, lumbar 01/25/2015  . Lumbar radiculopathy 01/25/2015  . Gallstones 12/23/2014  . Polyarthritis 03/16/2014  . Thrombocytopenia ([place]) 09/11/2013  . Other malaise and fatigue 09/10/2013  . Major depressive disorder, single episode 09/10/2013  . Raynaud phenomenon 04/17/2013  . Low back pain 12/05/2012  . Shortness of breath 06/20/2012  . Aborted cardiac arrest   . ICD-Medtronic   . PVC's / ventricular tachycardia   . 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 Irritable bowel syndrome     [name] ,PT, DPT, E-RYT  10/18/2015, 10:27 PM  [place] MAIN [facility] SERVICES [address] [place], [place], [address] Phone: [phone]   Fax:  [phone]  Name: NILAH BELCOURT MRN: [id number] Date of Birth: [birth year]

## 2015-10-21 ENCOUNTER — Encounter: Payer: Self-pay | Admitting: Cardiology

## 2015-10-21 LAB — CUP PACEART REMOTE DEVICE CHECK
Brady Statistic AS VP Percent: 0.03 %
Brady Statistic AS VS Percent: 48.39 %
Brady Statistic RA Percent Paced: 51.58 %
Date Time Interrogation Session: 20170202062209
HIGH POWER IMPEDANCE MEASURED VALUE: 90 Ohm
HighPow Impedance: 513 Ohm
HighPow Impedance: 59 Ohm
Implantable Lead Location: 753859
Implantable Lead Model: 5076
Lead Channel Impedance Value: 456 Ohm
Lead Channel Pacing Threshold Amplitude: 0.875 V
Lead Channel Sensing Intrinsic Amplitude: 2.5 mV
Lead Channel Setting Pacing Pulse Width: 0.4 ms
MDC IDC LEAD IMPLANT DT: 19970917
MDC IDC LEAD IMPLANT DT: 20070119
MDC IDC LEAD LOCATION: 753860
MDC IDC LEAD MODEL: 6942
MDC IDC MSMT BATTERY VOLTAGE: 3.06 V
MDC IDC MSMT LEADCHNL RA PACING THRESHOLD AMPLITUDE: 0.5 V
MDC IDC MSMT LEADCHNL RA PACING THRESHOLD PULSEWIDTH: 0.4 ms
MDC IDC MSMT LEADCHNL RA SENSING INTR AMPL: 2.5 mV
MDC IDC MSMT LEADCHNL RV IMPEDANCE VALUE: 551 Ohm
MDC IDC MSMT LEADCHNL RV PACING THRESHOLD PULSEWIDTH: 0.4 ms
MDC IDC MSMT LEADCHNL RV SENSING INTR AMPL: 11.125 mV
MDC IDC MSMT LEADCHNL RV SENSING INTR AMPL: 11.125 mV
MDC IDC SET LEADCHNL RA PACING AMPLITUDE: 2 V
MDC IDC SET LEADCHNL RV PACING AMPLITUDE: 2.5 V
MDC IDC SET LEADCHNL RV SENSING SENSITIVITY: 0.45 mV
MDC IDC STAT BRADY AP VP PERCENT: 0.01 %
MDC IDC STAT BRADY AP VS PERCENT: 51.57 %
MDC IDC STAT BRADY RV PERCENT PACED: 0.04 %

## 2015-10-24 ENCOUNTER — Ambulatory Visit: Payer: 59 | Admitting: Physical Therapy

## 2015-10-24 DIAGNOSIS — M629 Disorder of muscle, unspecified: Secondary | ICD-10-CM | POA: Diagnosis not present

## 2015-10-24 DIAGNOSIS — M79672 Pain in left foot: Secondary | ICD-10-CM | POA: Diagnosis not present

## 2015-10-24 DIAGNOSIS — R293 Abnormal posture: Secondary | ICD-10-CM

## 2015-10-24 DIAGNOSIS — N8189 Other female genital prolapse: Secondary | ICD-10-CM

## 2015-10-24 DIAGNOSIS — R279 Unspecified lack of coordination: Secondary | ICD-10-CM

## 2015-10-24 DIAGNOSIS — M79671 Pain in right foot: Secondary | ICD-10-CM | POA: Diagnosis not present

## 2015-10-24 NOTE — Patient Instructions (Signed)
Deep core level 2 (10 x 3/ day)    Yoga: Instead of forward folding --> mini squat, rock knees forward, scoop pelvic under, and chest lifts to straighten up. Press into the ground through all four corners of feet as you rise up ("Root to Move")   Warrior I stance: stepping back like you are on ski tracks that are hip width apart (use a line placed between feet for visual cuing) , 50% weight in in front leg (knee over ankle) , 50 % weight in the back leg

## 2015-10-24 NOTE — Therapy (Signed)
[place] MAIN [facility] SERVICES [address] [place], [place], [address] Phone: [phone]   Fax:  [phone]  Physical Therapy Treatment  Patient Details  Name: Nicole Swanson MRN: [id number] Date of Birth: 28-Feb-1956 Referring Provider: Matilde Sprang  Encounter Date: 10/24/2015      PT End of Session - 10/24/15 2212    Visit Number 4   Number of Visits 12   Date for PT Re-Evaluation 12/07/15   PT Start Time 1157   PT Stop Time 1700   PT Time Calculation (min) 55 min      Past Medical History  Diagnosis Date  . Aborted cardiac arrest 1994  . DVT (deep vein thrombosis) in pregnancy 1986    had heparin shots followed by coumadin  . Dual implantable cardiac defibrillator     Medtronic initially implanted in 1997  . 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 Irritable bowel syndrome   . PVC's (premature ventricular contractions)   . Ventricular tachycardia ([place])   . Family history of seizures     Question neurological versus cardiac  . Allergy   . Heart murmur     Past Surgical History  Procedure Laterality Date  . Cardiac defibrillator placement  1994    Dual chamber with pacemaker  . Nasal septum surgery  2006    [facility]   . Abdominal hysterectomy    . Carpal tunnel release Right   . Laparoscopic lysis of adhesions    . Appendectomy    . Pacemaker insertion      5 different devices  . Oophorectomy      There were no vitals filed for this visit.  Visit Diagnosis:  Pelvic floor weakness  Fascial defect  Lack of coordination  Poor posture  Pain in both feet      Subjective Assessment - 10/24/15 1614    Subjective Pt felt the uncomfortable feeling of her bladder occurring towards the end of the week which is an improvement compared to feeling it all the time. Pt also reports her feet are not as painful to walk on upon waking. Pt has practiced standing posture and decreasing lifting tasks at church.     Pertinent History Hx; cardiac conditions, Hx of abdominal surgeries,    Patient Stated Goals relief from prolapse, LBP, feet pain and to gain stabilty in core             [facility] PT Assessment - 10/24/15 1617    Observation/Other Assessments   Observations proper sitting without cuing    Posture/Postural Control   Posture Comments poor propioception of COM and shoulders (posterior lean) in stepping back into semi tandem stance.                   Pelvic Floor Special Questions - 10/24/15 1641    Pelvic Floor Internal Exam pt consented verbally without contraindications    Exam Type Vaginal   Sensation no tenderness noted   Strength fair squeeze, definite lift   Biofeedback without pillow            [facility] Adult PT Treatment/Exercise - 10/24/15 1641    Neuro Re-ed    Neuro Re-ed Details  deep core level 2, pelvic/spinal/ lower kinetic chain alignment in modification of yoga poses (replaced forward flexion with mini squat/posterior pelvic tilit/thoracic extension) and warrior 1 stance      Manual Therapy   Manual therapy comments reassessed pelvic floor  PT Education - 10/24/15 2211    Education provided Yes   Education Details HEP   Person(s) Educated Patient   Methods Explanation;Demonstration;Tactile cues;Verbal cues   Comprehension Returned demonstration;Verbalized understanding             PT Long Term Goals - 10/24/15 1634    PT LONG TERM GOAL #1   Title Pt will decrease her ODI score from 16% to < 10% in order to return to ADLs.   Time 12   Period Weeks   Status On-going   PT LONG TERM GOAL #2   Title Pt will decrease her PFDI score from 46% to < 36% in order optimize pelvic floor function for bowel and urinarion.   Time 12   Period Weeks   Status On-going   PT LONG TERM GOAL #3   Title Pt will demo increased scar mobility over abdomen in order o decrease pain and perform hip flexion without wincing.    Time 12    Period Weeks   Status Partially Met   PT LONG TERM GOAL #4   Title Pt will demo 5/5 hip flexion bilaterally in order to walk and perform sit to stand efficiently.    Time 12   Period Weeks   Status On-going   PT LONG TERM GOAL #5   Title Pt will demo no lumbopelvic instability with deep core level 1-4 (5 reps ) in order to improve balance and withstand load bearing forces through spine with carrying groceries.   Time 12   Period Weeks   Status On-going   PT LONG TERM GOAL #6   Title Pt will demo increased SLS time bilaterally to > 30 sec to minimize risk for falls.    Time 12   Period Weeks   Status On-going   PT LONG TERM GOAL #7   Title Pt will report no feet pain when getting out of bed and stepping on to the floor in order to transfer safely.   Time 12   Period Weeks   Status Achieved   PT LONG TERM GOAL #8   Title Pt willl demo more caudal position of bladder in standing (PT palpating w/ closure of pelvic floor muscles at a Grade 3)  in order to demo increased fascial tensigrity and pelvic floor strength to minimize worsening of prolapse symptoms.    Time 12   Period Weeks   Status Revised               Plan - 10/24/15 2212    Clinical Impression Statement Pt is progressing well, reporting that the bothersome sensation from prolapse that now only occurs at the end of the week compared to everyday.  Today pt showed complete closure and lift of her pelvic floor mm (Grade 3/5) without pillow under hips which indicates improved fascial tensigrity.  While pt reported decreased feet pain with pad wear under metatarsals, pt did show poor propioception of feet and lower kinetic chain to trunk and shoulder alignment in dynamic balance poses.  Pt continues to express interest in returning to yoga and PT plans to incorporate deep core principles and personalized neuro muscular re education into yoga poses pt may encounter in community classes in order to prepare pt for return to  community classes.  Pt continues to minimize lifting which will continue to help her prolapse Sx. Plan to assess feet at next session and to progress to resistive band exercises. Pt will contiue to benefit from skilled PT.  Pt will benefit from skilled therapeutic intervention in order to improve on the following deficits Decreased strength;Increased muscle spasms;Improper body mechanics;Difficulty walking;Decreased activity tolerance;Decreased balance;Decreased coordination;Decreased endurance;Decreased scar mobility;Increased fascial restricitons;Pain;Decreased safety awareness;Decreased range of motion;Decreased mobility;Impaired flexibility;Postural dysfunction   Rehab Potential Good   PT Frequency 1x / week   PT Duration 12 weeks   PT Treatment/Interventions ADLs/Self Care Home Management;Aquatic Therapy;Biofeedback;Cryotherapy;Electrical Stimulation;Moist Heat;Traction;Patient/family education;Neuromuscular re-education;Balance training;Therapeutic exercise;Therapeutic activities;Stair training;Gait training;Functional mobility training;Manual techniques;Scar mobilization;Passive range of motion;Energy conservation;Taping   Consulted and Agree with Plan of Care Patient        Problem List Patient Active Problem List   Diagnosis Date Noted  . Urinary frequency 09/16/2015  . Bladder prolapse, female, acquired 09/08/2015  . DDD (degenerative disc disease), lumbar 01/25/2015  . Sacroiliac joint dysfunction of both sides 01/25/2015  . Spinal stenosis, lumbar region, with neurogenic claudication 01/25/2015  . Facet syndrome, lumbar 01/25/2015  . Lumbar radiculopathy 01/25/2015  . Gallstones 12/23/2014  . Polyarthritis 03/16/2014  . Thrombocytopenia ([place]) 09/11/2013  . Other malaise and fatigue 09/10/2013  . Major depressive disorder, single episode 09/10/2013  . Raynaud phenomenon 04/17/2013  . Low back pain 12/05/2012  . Shortness of breath 06/07/2012  . Aborted cardiac arrest   .  ICD-Medtronic   . PVC's / ventricular tachycardia   . 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 Irritable bowel syndrome     [name] ,PT, DPT, E-RYT  10/24/2015, 10:18 PM  [place] MAIN [facility] SERVICES [address] [place], [place], [address] Phone: [phone]   Fax:  [phone]  Name: Nicole Swanson MRN: [id number] Date of Birth: Jun 02, 1956

## 2015-11-01 ENCOUNTER — Ambulatory Visit (INDEPENDENT_AMBULATORY_CARE_PROVIDER_SITE_OTHER): Payer: 59 | Admitting: Sports Medicine

## 2015-11-01 ENCOUNTER — Encounter: Payer: Self-pay | Admitting: Sports Medicine

## 2015-11-01 DIAGNOSIS — L909 Atrophic disorder of skin, unspecified: Secondary | ICD-10-CM

## 2015-11-01 DIAGNOSIS — M79672 Pain in left foot: Secondary | ICD-10-CM

## 2015-11-01 DIAGNOSIS — R29898 Other symptoms and signs involving the musculoskeletal system: Secondary | ICD-10-CM | POA: Diagnosis not present

## 2015-11-01 DIAGNOSIS — M21619 Bunion of unspecified foot: Secondary | ICD-10-CM

## 2015-11-01 DIAGNOSIS — M779 Enthesopathy, unspecified: Secondary | ICD-10-CM

## 2015-11-02 NOTE — Progress Notes (Signed)
Patient ID: Nicole Swanson, female   DOB: [birth year], 60 y.o.   MRN: [id number]   Subjective: Nicole Swanson is a 60 y.o. female patient who returns to office for follow up evaluation of Left foot pain. Patient states that her symptoms are completely gone after injection at left 2-4 MTPJs. Patient has been using met pads and now her custom orthotics with relief in symptoms. Patient did not take Mobic because of fear of upset stomach with medication. Patient denies any other pedal complaints.   Patient Active Problem List   Diagnosis Date Noted  . Urinary frequency 09/16/2015  . Bladder prolapse, female, acquired 09/08/2015  . DDD (degenerative disc disease), lumbar 01/25/2015  . Sacroiliac joint dysfunction of both sides 01/25/2015  . Spinal stenosis, lumbar region, with neurogenic claudication 01/25/2015  . Facet syndrome, lumbar 01/25/2015  . Lumbar radiculopathy 01/25/2015  . Gallstones 12/23/2014  . Polyarthritis 03/16/2014  . Thrombocytopenia ([place]) 09/11/2013  . Other malaise and fatigue 09/10/2013  . Major depressive disorder, single episode 09/10/2013  . Raynaud phenomenon 04/17/2013  . Low back pain 12/05/2012  . Shortness of breath 06/13/2012  . Aborted cardiac arrest   . ICD-Medtronic   . PVC's / ventricular tachycardia   . 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 Irritable bowel syndrome    Current Outpatient Prescriptions on File Prior to Visit  Medication Sig Dispense Refill  . aspirin 81 MG tablet Take 81 mg by mouth daily.      . [place]-Glucosamine-Vit D (HM GLUCOSAMINE & VITAMIN D3) TABS Take by mouth.    . Calcium Carb-Cholecalciferol (CALCIUM PLUS VITAMIN D3 PO) Take by mouth daily. Reported on 09/08/2015    . Calcium Carb-Cholecalciferol [phone] MG-UNIT CAPS Take by mouth.    . Cetirizine HCl 10 MG CAPS Take 10 mg by mouth daily.    . Cranberry (THERACRAN PO) Take 180 mg by mouth 2 (two) times daily. Reported on 09/08/2015    . DEXILANT 60 MG capsule  TAKE 1 CAPSULE BY MOUTH DAILY. 30 capsule 7  . docusate sodium (COLACE) 250 MG capsule Take 250 mg by mouth 2 (two) times daily.    . fexofenadine (ALLEGRA) 180 MG tablet Take 180 mg by mouth as needed for allergies or rhinitis.    . fluticasone (FLONASE) 50 MCG/ACT nasal spray USE 1 SPRAY IN EACH NOSTRIL DAILY 16 g 5  . HYDROcodone-acetaminophen (NORCO) 10-325 MG per tablet Take 1 tablet by mouth every 8 (eight) hours as needed. (Patient not taking: Reported on 09/16/2015) 90 tablet 0  . Lactulose 20 GM/30ML SOLN Take 30 mLs (20 g total) by mouth every 4 (four) hours as needed. Until constipation is relieved. 240 mL 1  . meloxicam (MOBIC) 15 MG tablet Take 1 tablet (15 mg total) by mouth daily. 30 tablet 0  . Misc Natural Products (OSTEO BI-FLEX TRIPLE STRENGTH PO) Take by mouth 2 (two) times daily.     . montelukast (SINGULAIR) 10 MG tablet TAKE 1 TABLET BY MOUTH AT BEDTIME. 90 tablet 1  . ondansetron (ZOFRAN) 8 MG tablet TAKE 1 TABLET BY MOUTH EVERY 8 HOURS AS NEEDED FOR NAUSEA OR VOMITING. 20 tablet 0  . polyethylene glycol-electrolytes (NULYTELY/GOLYTELY) 420 G solution Drink 8 ounces of solution every 8 hours until constipation is relieved (Patient not taking: Reported on 09/16/2015) 4000 mL 0  . sotalol (BETAPACE) 120 MG tablet TAKE 1 TABLET BY MOUTH 2 TIMES DAILY. 180 tablet 3  . tiZANidine (ZANAFLEX) 4 MG tablet Take  by mouth. Reported on 10/03/2015    . traMADol (ULTRAM) 50 MG tablet Take 2 tablets (100 mg total) by mouth every 6 (six) hours as needed. (Patient not taking: Reported on 09/16/2015) 180 tablet 2  . vitamin C (ASCORBIC ACID) 500 MG tablet Take 500 mg by mouth 2 (two) times daily.     Current Facility-Administered Medications on File Prior to Visit  Medication Dose Route Frequency Provider Last Rate Last Dose  . triamcinolone acetonide (KENALOG) 10 MG/ML injection 10 mg  10 mg Other Once Landis Martins, DPM       Allergies  Allergen Reactions  . Sulfa Antibiotics Diarrhea  .  Penicillins Rash  . Vancomycin Rash    Objective:  General: Alert and oriented x3 in no acute distress  Dermatology: No open lesions bilateral lower extremities, no webspace macerations, no ecchymosis bilateral, all nails x 10 are well manicured.  Neurovascular: Intact. No lower extremity edema bilateral.   Musculoskeletal:No tenderness with palpation at 2-4 MTPJs on Left,No pain with calf compression bilateral. Range of motion in all pedal joints are within normal limits without pain or crepitation. +Mild bunion and fat pad migration L>R. Strength within normal limits in all groups bilateral.   Assessment and Plan: Problem List Items Addressed This Visit    None    Visit Diagnoses    Left foot pain    -  Primary    resolved    Capsulitis        Bunion        Fat pad atrophy of foot           -Complete examination performed -Discussed long term plan of care -Cont with good supportive shoes and custom inserts -If recur ice and return to office immediately  -Patient to return to office as needed or sooner if condition recurs.  Landis Martins, DPM

## 2015-11-03 ENCOUNTER — Encounter: Payer: 59 | Admitting: Physical Therapy

## 2015-11-04 ENCOUNTER — Encounter: Payer: 59 | Admitting: Physical Therapy

## 2015-11-07 ENCOUNTER — Ambulatory Visit: Payer: 59 | Attending: Urology | Admitting: Physical Therapy

## 2015-11-07 DIAGNOSIS — R293 Abnormal posture: Secondary | ICD-10-CM | POA: Diagnosis not present

## 2015-11-07 DIAGNOSIS — M629 Disorder of muscle, unspecified: Secondary | ICD-10-CM | POA: Diagnosis not present

## 2015-11-07 DIAGNOSIS — R279 Unspecified lack of coordination: Secondary | ICD-10-CM | POA: Insufficient documentation

## 2015-11-07 DIAGNOSIS — N8189 Other female genital prolapse: Secondary | ICD-10-CM | POA: Diagnosis not present

## 2015-11-07 DIAGNOSIS — M79672 Pain in left foot: Secondary | ICD-10-CM | POA: Insufficient documentation

## 2015-11-07 DIAGNOSIS — M79671 Pain in right foot: Secondary | ICD-10-CM | POA: Insufficient documentation

## 2015-11-07 NOTE — Patient Instructions (Addendum)
  Handout with drawn cues and figures :  3 x / 2 x/ day  Warrior I    Warrior II   Warrior III     Childs pose rocking 5 x / 2 x day    Progress to Deep core 2 -3 (10 x 3/ 2 x day)        Child's Pose Rocking (maintain imaginary plate on back, rib suspenders in the front)     *Key principles:

## 2015-11-08 NOTE — Therapy (Signed)
[place] MAIN [facility] SERVICES [address] [place], [place], [address] Phone: [phone]   Fax:  [phone]  Physical Therapy Treatment  Patient Details  Name: Nicole Swanson MRN: [id number] Date of Birth: 22-Jun-1956 Referring Provider: Matilde Sprang  Encounter Date: 11/07/2015      PT End of Session - 11/07/15 1635    Visit Number 5   Number of Visits 12   Date for PT Re-Evaluation 12/07/15   PT Start Time 9417   PT Stop Time 1655   PT Time Calculation (min) 45 min   Activity Tolerance Patient tolerated treatment well;No increased pain   Behavior During Therapy [facility] for tasks assessed/performed      Past Medical History  Diagnosis Date  . Aborted cardiac arrest 1994  . DVT (deep vein thrombosis) in pregnancy 1986    had heparin shots followed by coumadin  . Dual implantable cardiac defibrillator     Medtronic initially implanted in 1997  . 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 Irritable bowel syndrome   . PVC's (premature ventricular contractions)   . Ventricular tachycardia ([place])   . Family history of seizures     Question neurological versus cardiac  . Allergy   . Heart murmur     Past Surgical History  Procedure Laterality Date  . Cardiac defibrillator placement  1994    Dual chamber with pacemaker  . Nasal septum surgery  2006    [facility]   . Abdominal hysterectomy    . Carpal tunnel release Right   . Laparoscopic lysis of adhesions    . Appendectomy    . Pacemaker insertion      5 different devices  . Oophorectomy      There were no vitals filed for this visit.  Visit Diagnosis:  Pelvic floor weakness  Fascial defect  Lack of coordination  Poor posture  Pain in both feet      Subjective Assessment - 11/07/15 1637    Subjective Pt feels her prolapse symptom is better but she still notices it  but it is not at aggravating as much. Pt 's feet feels better as well.  Pt feels her low  back is feeling better as well. Pt statesSquatty Potty has helped her have more normal sized stool.     Pertinent History Hx; cardiac conditions, Hx of abdominal surgeries,    Patient Stated Goals relief from prolapse, LBP, feet pain and to gain stabilty in core             [facility] PT Assessment - 11/08/15 2344    Observation/Other Assessments   Observations good carry over with feet propioception in semi tandem stance    Posture/Postural Control   Posture Comments required mod cuing, visual/tactile cues for more anterior thorax over pelvis in three yoga poses                      [place] [facility] PT Treatment/Exercise - 11/08/15 2344    Neuro Re-ed    Neuro Re-ed Details  see pt instructions                 PT Education - 11/08/15 2347    Education provided Yes   Education Details HEP   Person(s) Educated Patient   Methods Explanation;Demonstration;Tactile cues;Verbal cues;Handout   Comprehension Returned demonstration;Verbalized understanding             PT Long Term Goals - 11/07/15 4081  PT LONG TERM GOAL #1   Title Pt will decrease her ODI score from 16% to < 10% in order to return to ADLs.   Time 12   Period Weeks   Status On-going   PT LONG TERM GOAL #2   Title Pt will decrease her PFDI score from 46% to < 36% in order optimize pelvic floor function for bowel and urination.   Time 12   Period Weeks   Status On-going   PT LONG TERM GOAL #3   Title Pt will demo increased scar mobility over abdomen in order to decrease pain and perform hip flexion without wincing.    Time 12   Period Weeks   Status Partially Met   PT LONG TERM GOAL #4   Title Pt will demo 5/5 hip flexion bilaterally in order to walk and perform sit to stand efficiently.    Time 12   Period Weeks   Status On-going   PT LONG TERM GOAL #5   Title Pt will demo no lumbopelvic instability with deep core level 1-4 (5 reps ) in order to improve balance and withstand load bearing  forces through spine with carrying groceries.   Time 12   Period Weeks   Status On-going   PT LONG TERM GOAL #6   Title Pt will demo increased SLS time bilaterally to > 30 sec to minimize risk for falls.    Time 12   Period Weeks   Status On-going   PT LONG TERM GOAL #7   Title Pt will report no feet pain when getting out of bed and stepping on to the floor in order to transfer safely.   Time 12   Period Weeks   Status Achieved   PT LONG TERM GOAL #8   Title Pt willl demo more caudal position of bladder in standing (PT palpating w/ closure of pelvic floor muscles at a Grade 3)  in order to demo increased fascial tensigrity and pelvic floor strength to minimize worsening of prolapse symptoms.    Time 12   Period Weeks   Status Revised               Plan - 11/08/15 2347    Clinical Impression Statement Pt is progressing well towards her goals but will continue to benefit from skilled PT. Initiated deep core strengthening and  propioception training of thorax over pelvis in various yoga postures. Pt tolerated session without pain and showed improved postural awareness post Tx.    Pt will benefit from skilled therapeutic intervention in order to improve on the following deficits Decreased strength;Increased muscle spasms;Improper body mechanics;Difficulty walking;Decreased activity tolerance;Decreased balance;Decreased coordination;Decreased endurance;Decreased scar mobility;Increased fascial restricitons;Pain;Decreased safety awareness;Decreased range of motion;Decreased mobility;Impaired flexibility;Postural dysfunction   Rehab Potential Good   PT Frequency 1x / week   PT Duration 12 weeks   PT Treatment/Interventions ADLs/Self Care Home Management;Aquatic Therapy;Biofeedback;Cryotherapy;Electrical Stimulation;Moist Heat;Traction;Patient/family education;Neuromuscular re-education;Balance training;Therapeutic exercise;Therapeutic activities;Stair training;Gait training;Functional  mobility training;Manual techniques;Scar mobilization;Passive range of motion;Energy conservation;Taping   Consulted and Agree with Plan of Care Patient        Problem List Patient Active Problem List   Diagnosis Date Noted  . Urinary frequency 09/16/2015  . Bladder prolapse, female, acquired 09/08/2015  . DDD (degenerative disc disease), lumbar 01/25/2015  . Sacroiliac joint dysfunction of both sides 01/25/2015  . Spinal stenosis, lumbar region, with neurogenic claudication 01/25/2015  . Facet syndrome, lumbar 01/25/2015  . Lumbar radiculopathy 01/25/2015  . Gallstones 12/23/2014  . Polyarthritis  03/16/2014  . Thrombocytopenia ([place]) 09/11/2013  . Other malaise and fatigue 09/10/2013  . Major depressive disorder, single episode 09/10/2013  . Raynaud phenomenon 04/17/2013  . Low back pain 12/05/2012  . Shortness of breath 06/10/2012  . Aborted cardiac arrest   . ICD-Medtronic   . PVC's / ventricular tachycardia   . 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 Irritable bowel syndrome     [name] ,PT, DPT, E-RYT  11/08/2015, 11:51 PM  [place] MA[facility] SERVICES [address] [place], [place], [address] Phone: [phone]   Fax:  [phone]  Name: Nicole Swanson MRN: [id number] Date of Birth: 07-17-1956

## 2015-11-11 ENCOUNTER — Encounter: Payer: Self-pay | Admitting: Urology

## 2015-11-11 ENCOUNTER — Ambulatory Visit (INDEPENDENT_AMBULATORY_CARE_PROVIDER_SITE_OTHER): Payer: 59 | Admitting: Urology

## 2015-11-11 VITALS — BP 129/75 | HR 84 | Ht 67.0 in | Wt 139.0 lb

## 2015-11-11 DIAGNOSIS — N8110 Cystocele, unspecified: Secondary | ICD-10-CM

## 2015-11-11 NOTE — Progress Notes (Signed)
11/11/2015 11:37 AM   Nicole Swanson 1956-05-15 [id number]  Referring provider: [name], MD [place], [place] [address]  Chief Complaint  Patient presents with  . Bladder Prolapse    HPI: The patient is a new patient with mild mixed stress and urge incontinence. She sometimes leaks with coughing sneezing bending lifting. She sometimes has urge incontinence and postvoid dribbling. She denies enuresis. She does not wear pads. She sometimes gets up once a night and voids every 2-3 hours.  She denies a history of previous GU surgery. She is on cranberry and no longer get urinary tract infections. She is on Estrace more than a year ago. She has no neurological resection symptoms. Bowel movements are normal. She has a pacemaker  During the patient's last visit she had mild hypermobility of the bladder neck and a high grade 2 cystocele  She appeared to have some vague prolapse symptoms noted on her last visit.  The patient never had her urodynamics and chose to see our physical therapy team locally. She thinks it is helping some especially her core muscles. She still has some local vaginal symptoms   PMH: Past Medical History  Diagnosis Date  . Aborted cardiac arrest 1994  . DVT (deep vein thrombosis) in pregnancy 1986    had heparin shots followed by coumadin  . Dual implantable cardiac defibrillator     Medtronic initially implanted in 1997  . 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 Irritable bowel syndrome   . PVC's (premature ventricular contractions)   . Ventricular tachycardia ([place])   . Family history of seizures     Question neurological versus cardiac  . Allergy   . Heart murmur     Surgical History: Past Surgical History  Procedure Laterality Date  . Cardiac defibrillator placement  1994    Dual chamber with pacemaker  . Nasal septum surgery  2006    [facility]   . Abdominal hysterectomy    . Carpal tunnel  release Right   . Laparoscopic lysis of adhesions    . Appendectomy    . Pacemaker insertion      5 different devices  . Oophorectomy      Home Medications:    Medication List       This list is accurate as of: 11/11/15 11:37 AM.  Always use your most recent med list.               aspirin 81 MG tablet  Take 81 mg by mouth daily.     Calcium Carb-Cholecalciferol [phone] MG-UNIT Caps  Take by mouth.     CALCIUM PLUS VITAMIN D3 PO  Take by mouth daily. Reported on 09/08/2015     Cetirizine HCl 10 MG Caps  Take 10 mg by mouth daily.     DEXILANT 60 MG capsule  Generic drug:  dexlansoprazole  TAKE 1 CAPSULE BY MOUTH DAILY.     docusate sodium 250 MG capsule  Commonly known as:  COLACE  Take 250 mg by mouth 2 (two) times daily.     fexofenadine 180 MG tablet  Commonly known as:  ALLEGRA  Take 180 mg by mouth as needed for allergies or rhinitis.     fluticasone 50 MCG/ACT nasal spray  Commonly known as:  FLONASE  USE 1 SPRAY IN EACH NOSTRIL DAILY     HM GLUCOSAMINE & VITAMIN D3 Tabs  Take by mouth.     HYDROcodone-acetaminophen  10-325 MG tablet  Commonly known as:  NORCO  Take 1 tablet by mouth every 8 (eight) hours as needed.     Lactulose 20 GM/30ML Soln  Take 30 mLs (20 g total) by mouth every 4 (four) hours as needed. Until constipation is relieved.     meloxicam 15 MG tablet  Commonly known as:  MOBIC  Take 1 tablet (15 mg total) by mouth daily.     montelukast 10 MG tablet  Commonly known as:  SINGULAIR  TAKE 1 TABLET BY MOUTH AT BEDTIME.     ondansetron 8 MG tablet  Commonly known as:  ZOFRAN  TAKE 1 TABLET BY MOUTH EVERY 8 HOURS AS NEEDED FOR NAUSEA OR VOMITING.     OSTEO BI-FLEX TRIPLE STRENGTH PO  Take by mouth 2 (two) times daily.     polyethylene glycol-electrolytes 420 g solution  Commonly known as:  NuLYTELY/GoLYTELY  Drink 8 ounces of solution every 8 hours until constipation is relieved     sotalol 120 MG tablet  Commonly known as:   BETAPACE  TAKE 1 TABLET BY MOUTH 2 TIMES DAILY.     THERACRAN PO  Take 180 mg by mouth 2 (two) times daily. Reported on 09/08/2015     tiZANidine 4 MG tablet  Commonly known as:  ZANAFLEX  Take by mouth. Reported on 10/03/2015     traMADol 50 MG tablet  Commonly known as:  ULTRAM  Take 2 tablets (100 mg total) by mouth every 6 (six) hours as needed.     vitamin C 500 MG tablet  Commonly known as:  ASCORBIC ACID  Take 500 mg by mouth 2 (two) times daily.        Allergies:  Allergies  Allergen Reactions  . Sulfa Antibiotics Diarrhea  . Penicillins Rash  . Vancomycin Rash    Family History: Family History  Problem Relation Age of Onset  . Heart disease Mother 19    A Fib, CHF  . Stroke Mother   . Heart disease Father   . Stroke Father   . Seizures Brother   . Seizures Daughter     Social History:  reports that she quit smoking about 20 years ago. Her smoking use included Cigarettes. She has never used smokeless tobacco. She reports that she drinks alcohol. She reports that she does not use illicit drugs.  ROS: UROLOGY Frequent Urination?: Yes Hard to postpone urination?: No Burning/pain with urination?: No Get up at night to urinate?: No Leakage of urine?: Yes Urine stream starts and stops?: No Trouble starting stream?: No Do you have to strain to urinate?: No Blood in urine?: No Urinary tract infection?: No Sexually transmitted disease?: No Injury to kidneys or bladder?: No Painful intercourse?: No Weak stream?: No Currently pregnant?: No Vaginal bleeding?: No Last menstrual period?: hysterectomy  Gastrointestinal Nausea?: No Vomiting?: No Indigestion/heartburn?: Yes Diarrhea?: No Constipation?: No  Constitutional Fever: No Night sweats?: Yes Weight loss?: No Fatigue?: No  Skin Skin rash/lesions?: No Itching?: No  Eyes Blurred vision?: No Double vision?: No  Ears/Nose/Throat Sore throat?: No Sinus problems?:  No  Hematologic/Lymphatic Swollen glands?: No Easy bruising?: No  Cardiovascular Leg swelling?: No Chest pain?: No  Respiratory Cough?: No Shortness of breath?: No  Endocrine Excessive thirst?: No  Musculoskeletal Back pain?: Yes Joint pain?: Yes  Neurological Headaches?: No Dizziness?: No  Psychologic Depression?: No Anxiety?: No  Physical Exam: BP 129/75 mmHg  Pulse 84  Ht 5\' 7"  (1.702 m)  Wt 139 lb (63.05  kg)  BMI 21.77 kg/m2   Laboratory Data: Lab Results  Component Value Date   WBC 4.1 04/06/2015   WBC 4.1 04/06/2015   HGB 13.2 04/06/2015   HGB 13.2 04/06/2015   HCT 39.8 04/06/2015   HCT 39.5 04/06/2015   MCV 88.1 04/06/2015   MCV 87.6 04/06/2015   PLT 157.0 04/06/2015   PLT 156.0 04/06/2015    Lab Results  Component Value Date   CREATININE 0.80 04/06/2015    No results found for: PSA  No results found for: TESTOSTERONE  No results found for: HGBA1C  Urinalysis    Component Value Date/Time   COLORURINE YELLOW 07/02/2014 0945   APPEARANCEUR CLEAR 07/02/2014 0945   LABSPEC >=1.030* 07/02/2014 0945   PHURINE 5.5 07/02/2014 0945   GLUCOSEU Negative 09/16/2015 1149   GLUCOSEU NEGATIVE 07/02/2014 0945   HGBUR NEGATIVE 07/02/2014 0945   BILIRUBINUR Negative 09/16/2015 1149   BILIRUBINUR neg 09/08/2015 1131   BILIRUBINUR NEGATIVE 07/02/2014 0945   KETONESUR NEGATIVE 07/02/2014 0945   PROTEINUR neg 09/08/2015 1131   UROBILINOGEN 0.2 09/08/2015 1131   UROBILINOGEN 0.2 07/02/2014 0945   NITRITE Negative 09/16/2015 1149   NITRITE neg 09/08/2015 1131   NITRITE NEGATIVE 07/02/2014 0945   LEUKOCYTESUR 1+* 09/16/2015 1149   LEUKOCYTESUR Negative 09/08/2015 1131    Pertinent Imaging:   Assessment & Plan: The patient has mild prolapse symptoms and a cystocele. I will see her in 4 months. She understands I would need urodynamics before ever considering surgery and I would like to reexamine her since the findings were never that significant on  my last pelvic examination. I discussed briefly surgery and success rates today.   Reece Packer, MD  [facility] [address], [place], [place] [address] [phone]

## 2015-11-14 ENCOUNTER — Other Ambulatory Visit: Payer: Self-pay | Admitting: Internal Medicine

## 2015-11-14 ENCOUNTER — Encounter: Payer: 59 | Admitting: Physical Therapy

## 2015-11-21 ENCOUNTER — Ambulatory Visit: Payer: 59 | Admitting: Physical Therapy

## 2015-11-21 DIAGNOSIS — M629 Disorder of muscle, unspecified: Secondary | ICD-10-CM

## 2015-11-21 DIAGNOSIS — M79672 Pain in left foot: Secondary | ICD-10-CM | POA: Diagnosis not present

## 2015-11-21 DIAGNOSIS — R279 Unspecified lack of coordination: Secondary | ICD-10-CM

## 2015-11-21 DIAGNOSIS — R293 Abnormal posture: Secondary | ICD-10-CM

## 2015-11-21 DIAGNOSIS — N8189 Other female genital prolapse: Secondary | ICD-10-CM

## 2015-11-21 DIAGNOSIS — M79671 Pain in right foot: Secondary | ICD-10-CM | POA: Diagnosis not present

## 2015-11-21 NOTE — Patient Instructions (Addendum)
Bridging series w/ resistive band other side of doorknob:  Level 1:  Position:  Elbows bent, knees hip width apart, heels on chair or stool/  Stabilization points: shoulders, upper arms, back of head pressed into floor. Heel press downward.   Movement: inhale do nothing, exhale pull band by side, lower fists to floor completely while lifting hips.Keep stabilization points engaged when you allow the band to go back to starting position  10 x 2 reps       Level 2:  Position:  Elbows straight, arms raised to ceiling at shoulder height, knees apart like a ballerina,heels together on chair or stool   Stabilization points: shoulders, upper arms, back of head pressed into floor. Heel press downward.   Movement: inhale do nothing, exhale pull band by side, lower fists to floor completely while lifting hips. Keep stabilization points engaged when you allow the band to go back to starting position   10 x 2 reps      _____________________  Deep core level 3 with ujjayi breath (back of the throat breathing)  10x 3    ____________________ PELVIC FLOOR / KEGEL EXERCISES   Pelvic floor/ Kegel exercises are used to strengthen the muscles in the base of your pelvis that are responsible for supporting your pelvic organs and preventing urine/feces leakage. Based on your therapist's recommendations, they can be performed while standing, sitting, or lying down. Imagine pelvic floor area as a diamond with pelvic landmarks: top =pubic bone, bottom tip=tailbone, sides=sitting bones (ischial tuberosities).    Make yourself aware of this muscle group by using these cues while coordinating your breath:  Inhale, feel pelvic floor diamond area lower like hammock towards your feet and ribcage/belly expanding. Pause. Let the exhale naturally and feel your belly sink, abdominal muscles hugging in around you and you may notice the pelvic diamond draws upward towards your head forming a umbrella shape. Give  a squeeze during the exhalation like you are stopping the flow of urine. If you are squeezing the buttock muscles, try to give 50% less effort.   Common Errors:  Breath holding: If you are holding your breath, you may be bearing down against your bladder instead of pulling it up. If you belly bulges up while you are squeezing, you are holding your breath. Be sure to breathe gently in and out while exercising. Counting out loud may help you avoid holding your breath.  Accessory muscle use: You should not see or feel other muscle movement when performing pelvic floor exercises. When done properly, no one can tell that you are performing the exercises. Keep the buttocks, belly and inner thighs relaxed.  Overdoing it: Your muscles can fatigue and stop working for you if you over-exercise. You may actually leak more or feel soreness at the lower abdomen or rectum.  YOUR HOME EXERCISE PROGRAM  LONG HOLDS: Position: on back  Inhale and then exhale. Then squeeze the muscle and count aloud for 10 seconds. Rest with three long breaths. (Be sure to let belly sink in with exhales and not push outward)  Perform 4 repetitions, 5 times/day  SHORT HOLDS: Position: on back, sitting   Inhale and then exhale. Then squeeze the muscle.  (Be sure to let belly sink in with exhales and not push outward)  Perform 5 repetitions, 5  Times/day                      DECREASE DOWNWARD PRESSURE ON  YOUR PELVIC FLOOR, ABDOMINAL,  LOW BACK MUSCLES       PRESERVE YOUR PELVIC HEALTH LONG-TERM   ** SQUEEZE pelvic floor BEFORE YOUR SNEEZE, COUGH, LAUGH   ** EXHALE BEFORE YOU RISE AGAINST GRAVITY (lifting, sit to stand, from squat to stand)   ** LOG ROLL OUT OF BED INSTEAD OF CRUNCH/SIT-UP  __________________  Floor to rise:  Downward facing dog (scap retraction/depression)

## 2015-11-23 NOTE — Therapy (Addendum)
[place] MAIN [facility] SERVICES [address] [place], [place], [address] Phone: [phone]   Fax:  [phone]  Physical Therapy Treatment  Patient Details  Name: Nicole Swanson MRN: [id number] Date of Birth: 02/23/1956 Referring Provider: Matilde Sprang  Encounter Date: 11/21/2015      PT End of Session - 11/23/15 2255    Visit Number 6   Number of Visits 12   Date for PT Re-Evaluation 12/07/15   PT Start Time 1600   PT Stop Time 1650   PT Time Calculation (min) 50 min   Activity Tolerance Patient tolerated treatment well;No increased pain   Behavior During Therapy [facility] for tasks assessed/performed      Past Medical History  Diagnosis Date  . Aborted cardiac arrest 1994  . DVT (deep vein thrombosis) in pregnancy 1986    had heparin shots followed by coumadin  . Dual implantable cardiac defibrillator     Medtronic initially implanted in 1997  . 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 Irritable bowel syndrome   . PVC's (premature ventricular contractions)   . Ventricular tachycardia ([place])   . Family history of seizures     Question neurological versus cardiac  . Allergy   . Heart murmur     Past Surgical History  Procedure Laterality Date  . Cardiac defibrillator placement  1994    Dual chamber with pacemaker  . Nasal septum surgery  2006    [facility]   . Abdominal hysterectomy    . Carpal tunnel release Right   . Laparoscopic lysis of adhesions    . Appendectomy    . Pacemaker insertion      5 different devices  . Oophorectomy      There were no vitals filed for this visit.  Visit Diagnosis:  Pelvic floor weakness  Lack of coordination  Fascial defect  Poor posture  Pain in both feet      Subjective Assessment - 11/23/15 2251    Subjective Pt continues to not notice the prolapse Sx as much compared to last session and she feels it is improving and not bothering when sitting anymore. Pt only  feels it when standing. Her bowel movements has "tremendously" improved.    Pertinent History Hx; cardiac conditions, Hx of abdominal surgeries,    Patient Stated Goals relief from prolapse, LBP, feet pain and to gain stabilty in core             [facility] PT Assessment - 11/23/15 2251    Floor to Stand   Comments half kneel w/ poor alignment, breathholding                       [facility] Adult PT Treatment/Exercise - 11/23/15 2251    Bed Mobility   Bed Mobility --  cued for log rolling   Neuro Re-ed    Neuro Re-ed Details  see pt instruction                     PT Long Term Goals - 11/21/15 1609    PT LONG TERM GOAL #1   Title Pt will decrease her ODI score from 16% to < 10% in order to return to ADLs.   Time 12   Period Weeks   Status On-going   PT LONG TERM GOAL #2   Title Pt will decrease her PFDI score from 46% to < 36% in order optimize pelvic floor  function for bowel and urination.   Time 12   Period Weeks   Status On-going   PT LONG TERM GOAL #3   Title Pt will demo increased scar mobility over abdomen in order to decrease pain and perform hip flexion without wincing.    Time 12   Period Weeks   Status Achieved   PT LONG TERM GOAL #4   Title Pt will demo 5/5 hip flexion bilaterally in order to walk and perform sit to stand efficiently.    Time 12   Period Weeks   Status On-going   PT LONG TERM GOAL #5   Title Pt will demo no lumbopelvic instability with deep core level 1-4 (5 reps ) in order to improve balance and withstand load bearing forces through spine with carrying groceries.   Time 12   Period Weeks   Status On-going   PT LONG TERM GOAL #6   Title Pt will demo increased SLS time bilaterally to > 30 sec to minimize risk for falls.    Time 12   Period Weeks   Status On-going   PT LONG TERM GOAL #7   Title Pt will report no feet pain when getting out of bed and stepping on to the floor in order to transfer safely.   Time 12    Period Weeks   Status Achieved   PT LONG TERM GOAL #8   Title Pt willl demo more caudal position of bladder in standing (PT palpating w/ closure of pelvic floor muscles at a Grade 3)  in order to demo increased fascial tensigrity and pelvic floor strength to minimize worsening of prolapse symptoms.    Time 12   Period Weeks   Status Partially Met               Plan - 11/23/15 2256    Clinical Impression Statement Pt progresed with resistance band exercises (posterior back mm)  without complaints and showed improved with propioception with sacpaular depression/retraction. Pt will continue to benefit from skilled PT.    Pt will benefit from skilled therapeutic intervention in order to improve on the following deficits Decreased strength;Increased muscle spasms;Improper body mechanics;Difficulty walking;Decreased activity tolerance;Decreased balance;Decreased coordination;Decreased endurance;Decreased scar mobility;Increased fascial restricitons;Pain;Decreased safety awareness;Decreased range of motion;Decreased mobility;Impaired flexibility;Postural dysfunction   Rehab Potential Good   PT Frequency 1x / week   PT Duration 12 weeks   PT Treatment/Interventions ADLs/Self Care Home Management;Aquatic Therapy;Biofeedback;Cryotherapy;Electrical Stimulation;Moist Heat;Traction;Patient/family education;Neuromuscular re-education;Balance training;Therapeutic exercise;Therapeutic activities;Stair training;Gait training;Functional mobility training;Manual techniques;Scar mobilization;Passive range of motion;Energy conservation;Taping   Consulted and Agree with Plan of Care Patient        Problem List Patient Active Problem List   Diagnosis Date Noted  . Urinary frequency 09/16/2015  . Bladder prolapse, female, acquired 09/08/2015  . DDD (degenerative disc disease), lumbar 01/25/2015  . Sacroiliac joint dysfunction of both sides 01/25/2015  . Spinal stenosis, lumbar region, with neurogenic  claudication 01/25/2015  . Facet syndrome, lumbar 01/25/2015  . Lumbar radiculopathy 01/25/2015  . Gallstones 12/23/2014  . Polyarthritis 03/16/2014  . Thrombocytopenia ([place]) 09/11/2013  . Other malaise and fatigue 09/10/2013  . Major depressive disorder, single episode 09/10/2013  . Raynaud phenomenon 04/17/2013  . Low back pain 12/05/2012  . Shortness of breath 06/30/2012  . Aborted cardiac arrest   . ICD-Medtronic   . PVC's / ventricular tachycardia   . 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 Irritable bowel syndrome     [name] ,PT, DPT,  E-RYT  11/23/2015, 10:57 PM  [place] MAIN [facility] SERVICES [address] [place], [place], [address] Phone: [phone]   Fax:  [phone]  Name: MARIACRISTINA ADAY MRN: [id number] Date of Birth: 10-27-1955

## 2015-11-23 NOTE — Therapy (Signed)
[place] MAIN [facility] SERVICES [place], [place], [address] Phone: [phone]   Fax:  [phone]  Physical Therapy Treatment  Patient Details  Name: NAHARA DONA MRN: [id number] Date of Birth: 06/25/1956 Referring Provider: Matilde Sprang  Encounter Date: 11/21/2015    Past Medical History  Diagnosis Date  . Aborted cardiac arrest 1994  . DVT (deep vein thrombosis) in pregnancy 1986    had heparin shots followed by coumadin  . Dual implantable cardiac defibrillator     Medtronic initially implanted in 1997  . 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 Irritable bowel syndrome   . PVC's (premature ventricular contractions)   . Ventricular tachycardia ([place])   . Family history of seizures     Question neurological versus cardiac  . Allergy   . Heart murmur     Past Surgical History  Procedure Laterality Date  . Cardiac defibrillator placement  1994    Dual chamber with pacemaker  . Nasal septum surgery  2006    [facility]   . Abdominal hysterectomy    . Carpal tunnel release Right   . Laparoscopic lysis of adhesions    . Appendectomy    . Pacemaker insertion      5 different devices  . Oophorectomy      There were no vitals filed for this visit.  Visit Diagnosis:  Pelvic floor weakness  Lack of coordination  Fascial defect  Poor posture  Pain in both feet                                    PT Long Term Goals - 11/21/15 1609    PT LONG TERM GOAL #1   Title Pt will decrease her ODI score from 16% to < 10% in order to return to ADLs.   Time 12   Period Weeks   Status On-going   PT LONG TERM GOAL #2   Title Pt will decrease her PFDI score from 46% to < 36% in order optimize pelvic floor function for bowel and urination.   Time 12   Period Weeks   Status On-going   PT LONG TERM GOAL #3   Title Pt will demo increased scar mobility over abdomen in order to  decrease pain and perform hip flexion without wincing.    Time 12   Period Weeks   Status Achieved   PT LONG TERM GOAL #4   Title Pt will demo 5/5 hip flexion bilaterally in order to walk and perform sit to stand efficiently.    Time 12   Period Weeks   Status On-going   PT LONG TERM GOAL #5   Title Pt will demo no lumbopelvic instability with deep core level 1-4 (5 reps ) in order to improve balance and withstand load bearing forces through spine with carrying groceries.   Time 12   Period Weeks   Status On-going   PT LONG TERM GOAL #6   Title Pt will demo increased SLS time bilaterally to > 30 sec to minimize risk for falls.    Time 12   Period Weeks   Status On-going   PT LONG TERM GOAL #7   Title Pt will report no feet pain when getting out of bed and stepping on to the floor in order to transfer safely.   Time 12   Period Weeks  Status Achieved   PT LONG TERM GOAL #8   Title Pt willl demo more caudal position of bladder in standing (PT palpating w/ closure of pelvic floor muscles at a Grade 3)  in order to demo increased fascial tensigrity and pelvic floor strength to minimize worsening of prolapse symptoms.    Time 12   Period Weeks   Status Partially Met               Problem List Patient Active Problem List   Diagnosis Date Noted  . Urinary frequency 09/16/2015  . Bladder prolapse, female, acquired 09/08/2015  . DDD (degenerative disc disease), lumbar 01/25/2015  . Sacroiliac joint dysfunction of both sides 01/25/2015  . Spinal stenosis, lumbar region, with neurogenic claudication 01/25/2015  . Facet syndrome, lumbar 01/25/2015  . Lumbar radiculopathy 01/25/2015  . Gallstones 12/23/2014  . Polyarthritis 03/16/2014  . Thrombocytopenia ([facility]) 09/11/2013  . Other malaise and fatigue 09/10/2013  . Major depressive disorder, single episode 09/10/2013  . Raynaud phenomenon 04/17/2013  . Low back pain 12/05/2012  . Shortness of breath 06/16/2012  . Aborted  cardiac arrest   . ICD-Medtronic   . PVC's / ventricular tachycardia   . 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 Irritable bowel syndrome     Jacquez Sheetz,Shin Yiing 11/23/2015, 10:50 PM  [facility] MAIN REHAB SERVICES [address] [place], [place], [address] Phone: [phone]   Fax:  [phone]  Name: Johnnetta A Hoobler MRN: [id number] Date of Birth: 08/03/1956     

## 2015-11-23 NOTE — Therapy (Signed)
[place] MAIN [facility] SERVICES [place], [place], [address] Phone: [phone]   Fax:  [phone]  Physical Therapy Treatment  Patient Details  Name: Nicole Swanson MRN: [id number] Date of Birth: 06/25/1956 Referring Provider: Matilde Sprang  Encounter Date: 11/21/2015    Past Medical History  Diagnosis Date  . Aborted cardiac arrest 1994  . DVT (deep vein thrombosis) in pregnancy 1986    had heparin shots followed by coumadin  . Dual implantable cardiac defibrillator     Medtronic initially implanted in 1997  . 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 Irritable bowel syndrome   . PVC's (premature ventricular contractions)   . Ventricular tachycardia ([place])   . Family history of seizures     Question neurological versus cardiac  . Allergy   . Heart murmur     Past Surgical History  Procedure Laterality Date  . Cardiac defibrillator placement  1994    Dual chamber with pacemaker  . Nasal septum surgery  2006    [facility]   . Abdominal hysterectomy    . Carpal tunnel release Right   . Laparoscopic lysis of adhesions    . Appendectomy    . Pacemaker insertion      5 different devices  . Oophorectomy      There were no vitals filed for this visit.  Visit Diagnosis:  Pelvic floor weakness  Lack of coordination  Fascial defect  Poor posture  Pain in both feet                                    PT Long Term Goals - 11/21/15 1609    PT LONG TERM GOAL #1   Title Pt will decrease her ODI score from 16% to < 10% in order to return to ADLs.   Time 12   Period Weeks   Status On-going   PT LONG TERM GOAL #2   Title Pt will decrease her PFDI score from 46% to < 36% in order optimize pelvic floor function for bowel and urination.   Time 12   Period Weeks   Status On-going   PT LONG TERM GOAL #3   Title Pt will demo increased scar mobility over abdomen in order to  decrease pain and perform hip flexion without wincing.    Time 12   Period Weeks   Status Achieved   PT LONG TERM GOAL #4   Title Pt will demo 5/5 hip flexion bilaterally in order to walk and perform sit to stand efficiently.    Time 12   Period Weeks   Status On-going   PT LONG TERM GOAL #5   Title Pt will demo no lumbopelvic instability with deep core level 1-4 (5 reps ) in order to improve balance and withstand load bearing forces through spine with carrying groceries.   Time 12   Period Weeks   Status On-going   PT LONG TERM GOAL #6   Title Pt will demo increased SLS time bilaterally to > 30 sec to minimize risk for falls.    Time 12   Period Weeks   Status On-going   PT LONG TERM GOAL #7   Title Pt will report no feet pain when getting out of bed and stepping on to the floor in order to transfer safely.   Time 12   Period Weeks  Status Achieved   PT LONG TERM GOAL #8   Title Pt willl demo more caudal position of bladder in standing (PT palpating w/ closure of pelvic floor muscles at a Grade 3)  in order to demo increased fascial tensigrity and pelvic floor strength to minimize worsening of prolapse symptoms.    Time 12   Period Weeks   Status Partially Met               Problem List Patient Active Problem List   Diagnosis Date Noted  . Urinary frequency 09/16/2015  . Bladder prolapse, female, acquired 09/08/2015  . DDD (degenerative disc disease), lumbar 01/25/2015  . Sacroiliac joint dysfunction of both sides 01/25/2015  . Spinal stenosis, lumbar region, with neurogenic claudication 01/25/2015  . Facet syndrome, lumbar 01/25/2015  . Lumbar radiculopathy 01/25/2015  . Gallstones 12/23/2014  . Polyarthritis 03/16/2014  . Thrombocytopenia (H[facility]) 09/11/2013  . Other malaise and fatigue 09/10/2013  . Major depressive disorder, single episode 09/10/2013  . Raynaud phenomenon 04/17/2013  . Low back pain 12/05/2012  . Shortness of breath 06/05/2012  . Aborted  cardiac arrest   . ICD-Medtronic   . PVC's / ventricular tachycardia   . 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 Irritable bowel syndrome     Nicole Swanson,Nicole Swanson 11/23/2015, 10:50 PM  [place] [facility] MAIN REHAB SERVICES [address] [place], [place], [address] Phone: [phone]   Fax:  [phone]  Name: Nicole Swanson MRN: [id number] Date of Birth: 01/15/1956     

## 2015-11-23 NOTE — Therapy (Signed)
[place] MAIN [facility] SERVICES [place], [place], [address] Phone: [phone]   Fax:  [phone]  Physical Therapy Treatment  Patient Details  Name: Nicole Swanson MRN: [id number] Date of Birth: 06/25/1956 Referring Provider: Matilde Sprang  Encounter Date: 11/21/2015    Past Medical History  Diagnosis Date  . Aborted cardiac arrest 1994  . DVT (deep vein thrombosis) in pregnancy 1986    had heparin shots followed by coumadin  . Dual implantable cardiac defibrillator     Medtronic initially implanted in 1997  . 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 Irritable bowel syndrome   . PVC's (premature ventricular contractions)   . Ventricular tachycardia ([place])   . Family history of seizures     Question neurological versus cardiac  . Allergy   . Heart murmur     Past Surgical History  Procedure Laterality Date  . Cardiac defibrillator placement  1994    Dual chamber with pacemaker  . Nasal septum surgery  2006    [facility]   . Abdominal hysterectomy    . Carpal tunnel release Right   . Laparoscopic lysis of adhesions    . Appendectomy    . Pacemaker insertion      5 different devices  . Oophorectomy      There were no vitals filed for this visit.  Visit Diagnosis:  Pelvic floor weakness  Lack of coordination  Fascial defect  Poor posture  Pain in both feet                                    PT Long Term Goals - 11/21/15 1609    PT LONG TERM GOAL #1   Title Pt will decrease her ODI score from 16% to < 10% in order to return to ADLs.   Time 12   Period Weeks   Status On-going   PT LONG TERM GOAL #2   Title Pt will decrease her PFDI score from 46% to < 36% in order optimize pelvic floor function for bowel and urination.   Time 12   Period Weeks   Status On-going   PT LONG TERM GOAL #3   Title Pt will demo increased scar mobility over abdomen in order to  decrease pain and perform hip flexion without wincing.    Time 12   Period Weeks   Status Achieved   PT LONG TERM GOAL #4   Title Pt will demo 5/5 hip flexion bilaterally in order to walk and perform sit to stand efficiently.    Time 12   Period Weeks   Status On-going   PT LONG TERM GOAL #5   Title Pt will demo no lumbopelvic instability with deep core level 1-4 (5 reps ) in order to improve balance and withstand load bearing forces through spine with carrying groceries.   Time 12   Period Weeks   Status On-going   PT LONG TERM GOAL #6   Title Pt will demo increased SLS time bilaterally to > 30 sec to minimize risk for falls.    Time 12   Period Weeks   Status On-going   PT LONG TERM GOAL #7   Title Pt will report no feet pain when getting out of bed and stepping on to the floor in order to transfer safely.   Time 12   Period Weeks  Status Achieved   PT LONG TERM GOAL #8   Title Pt willl demo more caudal position of bladder in standing (PT palpating w/ closure of pelvic floor muscles at a Grade 3)  in order to demo increased fascial tensigrity and pelvic floor strength to minimize worsening of prolapse symptoms.    Time 12   Period Weeks   Status Partially Met               Problem List Patient Active Problem List   Diagnosis Date Noted  . Urinary frequency 09/16/2015  . Bladder prolapse, female, acquired 09/08/2015  . DDD (degenerative disc disease), lumbar 01/25/2015  . Sacroiliac joint dysfunction of both sides 01/25/2015  . Spinal stenosis, lumbar region, with neurogenic claudication 01/25/2015  . Facet syndrome, lumbar 01/25/2015  . Lumbar radiculopathy 01/25/2015  . Gallstones 12/23/2014  . Polyarthritis 03/16/2014  . Thrombocytopenia ([place]) 09/11/2013  . Other malaise and fatigue 09/10/2013  . Major depressive disorder, single episode 09/10/2013  . Raynaud phenomenon 04/17/2013  . Low back pain 12/05/2012  . Shortness of breath 06/21/2012  . Aborted  cardiac arrest   . ICD-Medtronic   . PVC's / ventricular tachycardia   . 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 Irritable bowel syndrome     [name] 11/23/2015, 10:50 PM  [place] MAIN [facility] SERVICES [address] [place], [place], [address] Phone: [phone]   Fax:  [phone]  Name: ANGELLYNN KIMBERLIN MRN: [id number] Date of Birth: 13-Aug-1956

## 2015-11-23 NOTE — Therapy (Deleted)
[place] MAIN [facility] SERVICES [place], [place], [address] Phone: [phone]   Fax:  [phone]  Physical Therapy Treatment  Patient Details  Name: Nicole Swanson MRN: [id number] Date of Birth: 06/25/1956 Referring Provider: Matilde Sprang  Encounter Date: 11/21/2015    Past Medical History  Diagnosis Date  . Aborted cardiac arrest 1994  . DVT (deep vein thrombosis) in pregnancy 1986    had heparin shots followed by coumadin  . Dual implantable cardiac defibrillator     Medtronic initially implanted in 1997  . 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 Irritable bowel syndrome   . PVC's (premature ventricular contractions)   . Ventricular tachycardia ([place])   . Family history of seizures     Question neurological versus cardiac  . Allergy   . Heart murmur     Past Surgical History  Procedure Laterality Date  . Cardiac defibrillator placement  1994    Dual chamber with pacemaker  . Nasal septum surgery  2006    [facility]   . Abdominal hysterectomy    . Carpal tunnel release Right   . Laparoscopic lysis of adhesions    . Appendectomy    . Pacemaker insertion      5 different devices  . Oophorectomy      There were no vitals filed for this visit.  Visit Diagnosis:  Pelvic floor weakness  Lack of coordination  Fascial defect  Poor posture  Pain in both feet                                    PT Long Term Goals - 11/21/15 1609    PT LONG TERM GOAL #1   Title Pt will decrease her ODI score from 16% to < 10% in order to return to ADLs.   Time 12   Period Weeks   Status On-going   PT LONG TERM GOAL #2   Title Pt will decrease her PFDI score from 46% to < 36% in order optimize pelvic floor function for bowel and urination.   Time 12   Period Weeks   Status On-going   PT LONG TERM GOAL #3   Title Pt will demo increased scar mobility over abdomen in order to  decrease pain and perform hip flexion without wincing.    Time 12   Period Weeks   Status Achieved   PT LONG TERM GOAL #4   Title Pt will demo 5/5 hip flexion bilaterally in order to walk and perform sit to stand efficiently.    Time 12   Period Weeks   Status On-going   PT LONG TERM GOAL #5   Title Pt will demo no lumbopelvic instability with deep core level 1-4 (5 reps ) in order to improve balance and withstand load bearing forces through spine with carrying groceries.   Time 12   Period Weeks   Status On-going   PT LONG TERM GOAL #6   Title Pt will demo increased SLS time bilaterally to > 30 sec to minimize risk for falls.    Time 12   Period Weeks   Status On-going   PT LONG TERM GOAL #7   Title Pt will report no feet pain when getting out of bed and stepping on to the floor in order to transfer safely.   Time 12   Period Weeks  Status Achieved   PT LONG TERM GOAL #8   Title Pt willl demo more caudal position of bladder in standing (PT palpating w/ closure of pelvic floor muscles at a Grade 3)  in order to demo increased fascial tensigrity and pelvic floor strength to minimize worsening of prolapse symptoms.    Time 12   Period Weeks   Status Partially Met               Problem List Patient Active Problem List   Diagnosis Date Noted  . Urinary frequency 09/16/2015  . Bladder prolapse, female, acquired 09/08/2015  . DDD (degenerative disc disease), lumbar 01/25/2015  . Sacroiliac joint dysfunction of both sides 01/25/2015  . Spinal stenosis, lumbar region, with neurogenic claudication 01/25/2015  . Facet syndrome, lumbar 01/25/2015  . Lumbar radiculopathy 01/25/2015  . Gallstones 12/23/2014  . Polyarthritis 03/16/2014  . Thrombocytopenia ([place]) 09/11/2013  . Other malaise and fatigue 09/10/2013  . Major depressive disorder, single episode 09/10/2013  . Raynaud phenomenon 04/17/2013  . Low back pain 12/05/2012  . Shortness of breath 06/24/2012  . Aborted  cardiac arrest   . ICD-Medtronic   . PVC's / ventricular tachycardia   . 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 Irritable bowel syndrome     Nicole Swanson ,PT, DPT, E-RYT  11/23/2015, 10:50 PM  [place] MAIN [facility] SERVICES [address] [place], [place], [address] Phone: [phone]   Fax:  [phone]  Name: Nicole Swanson MRN: [id number] Date of Birth: [birth year]

## 2015-11-28 ENCOUNTER — Ambulatory Visit: Payer: 59 | Admitting: Physical Therapy

## 2015-11-28 DIAGNOSIS — R279 Unspecified lack of coordination: Secondary | ICD-10-CM

## 2015-11-28 DIAGNOSIS — N8189 Other female genital prolapse: Secondary | ICD-10-CM

## 2015-11-28 DIAGNOSIS — M629 Disorder of muscle, unspecified: Secondary | ICD-10-CM

## 2015-11-28 DIAGNOSIS — M79672 Pain in left foot: Secondary | ICD-10-CM | POA: Diagnosis not present

## 2015-11-28 DIAGNOSIS — M79671 Pain in right foot: Secondary | ICD-10-CM

## 2015-11-28 DIAGNOSIS — R293 Abnormal posture: Secondary | ICD-10-CM | POA: Diagnosis not present

## 2015-11-28 NOTE — Patient Instructions (Addendum)
Precautions with yoga as related to heart condition  _avoid pranayama practices (breathholding, altering lengths of breath)  _avoid inversions (hips higher than heart) maintain level position of hips and heart if staying in that position for >32min  _avoid fast flowing positions between floor to standing _be aware that if doing planks (chatarunga) or any pushing against floor that it can exert on the heart. Modify with wall push ups instead. And ensure exhalation with exertion.    ________  Deep core progression: Level 3  Level 4 (with palm by lower ribs to create a slight stretch)   30 reps  ________  Pelvic floor endurance exercise: try to progress to 5 reps x 10 sec holds, at 5 x times per day  Quick contraction with coughing. Sneezing   ________ Walking mechanics:  "spread toes" "ballmound of feet, not curling toes"  "footprint in sand"   _________  Heel raises onto ballmounds Inhale up, exhale , slow and controlled lowering down heels.  10 x 3 reps

## 2015-11-29 NOTE — Therapy (Signed)
[place] MAIN [facility] SERVICES [address] [place], [place], [address] Phone: [phone]   Fax:  [phone]  Physical Therapy Treatment  Patient Details  Name: Nicole Swanson MRN: [id number] Date of Birth: 02/04/1956 Referring Provider: Matilde Sprang  Encounter Date: 11/28/2015      PT End of Session - 11/29/15 2105    Visit Number 7   Number of Visits 12   Date for PT Re-Evaluation 12/26/15   PT Start Time 1500   PT Stop Time 1600   PT Time Calculation (min) 60 min   Activity Tolerance Patient tolerated treatment well;No increased pain   Behavior During Therapy [facility] for tasks assessed/performed      Past Medical History  Diagnosis Date  . Aborted cardiac arrest 1994  . DVT (deep vein thrombosis) in pregnancy 1986    had heparin shots followed by coumadin  . Dual implantable cardiac defibrillator     Medtronic initially implanted in 1997  . 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 Irritable bowel syndrome   . PVC's (premature ventricular contractions)   . Ventricular tachycardia ([place])   . Family history of seizures     Question neurological versus cardiac  . Allergy   . Heart murmur     Past Surgical History  Procedure Laterality Date  . Cardiac defibrillator placement  1994    Dual chamber with pacemaker  . Nasal septum surgery  2006    [facility]   . Abdominal hysterectomy    . Carpal tunnel release Right   . Laparoscopic lysis of adhesions    . Appendectomy    . Pacemaker insertion      5 different devices  . Oophorectomy      There were no vitals filed for this visit.  Visit Diagnosis:  Pelvic floor weakness  Fascial defect  Poor posture  Pain in both feet  Lack of coordination      Subjective Assessment - 11/28/15 1703    Subjective Pt reports a slight pulling upward sensation in pubic area with downward facing dog pose to spinal extension. Pt feels her balance is improving. Overall she  feels her Sx are improving at 25-33% and her Sx are not aggravating her and her back is not hurting her as often. Pt reports her pacemaker paces her heart all the time, and her irregular heart rhythms are managed by medications.     Pertinent History Hx; cardiac conditions, Hx of abdominal surgeries,    Patient Stated Goals relief from prolapse, LBP, feet pain and to gain stabilty in core             [facility] PT Assessment - 11/29/15 2102    Palpation   Palpation comment increased fascial restrictions on R > L along abdominal scar    Ambulation/Gait   Gait Comments supination on stance phase Bilaterally                   Pelvic Floor Special Questions - 11/29/15 2102    Prolapse other bladder more caudal post Tx   Pelvic Floor Internal Exam pt consented verbally without contraindications    Exam Type Vaginal   Sensation no tenderness noted   Strength --  more posterior activation > ant pre Tx, more ant post Tx   Biofeedback without pillow            [facility] Adult PT Treatment/Exercise - 11/29/15 2101    Ambulation/Gait   Gait  Comments supination on stance phase Bilaterally    Neuro Re-ed    Neuro Re-ed Details  see pt instruction    Manual Therapy   Myofascial Release scar massage, abdominal masage                PT Education - 11/29/15 2105    Education provided Yes   Education Details HEP   Person(s) Educated Patient   Methods Explanation;Demonstration;Tactile cues;Verbal cues;Handout   Comprehension Returned demonstration;Verbalized understanding             PT Long Term Goals - 11/29/15 2106    PT LONG TERM GOAL #1   Title Pt will decrease her ODI score from 16% to < 10% in order to return to ADLs.   Time 12   Period Weeks   Status On-going   PT LONG TERM GOAL #2   Title Pt will decrease her PFDI score from 46% to < 36% in order optimize pelvic floor function for bowel and urination.   Time 12   Period Weeks   Status On-going   PT LONG  TERM GOAL #3   Title Pt will demo increased scar mobility over abdomen in order to decrease pain and perform hip flexion without wincing.    Time 12   Period Weeks   Status Achieved   PT LONG TERM GOAL #4   Title Pt will demo 5/5 hip flexion bilaterally in order to walk and perform sit to stand efficiently.    Time 12   Period Weeks   Status On-going   PT LONG TERM GOAL #5   Title Pt will demo no lumbopelvic instability with deep core level 1-4 (5 reps ) in order to improve balance and withstand load bearing forces through spine with carrying groceries.   Time 12   Period Weeks   Status On-going   Additional Long Term Goals   Additional Long Term Goals Yes   PT LONG TERM GOAL #6   Title Pt will demo increased SLS time bilaterally to > 30 sec to minimize risk for falls.    Time 12   Period Weeks   Status On-going   PT LONG TERM GOAL #7   Title Pt will report no feet pain when getting out of bed and stepping on to the floor in order to transfer safely.   Time 12   Period Weeks   Status Achieved   PT LONG TERM GOAL #8   Title Pt willl demo more caudal position of bladder in standing (PT palpating w/ closure of pelvic floor muscles at a Grade 3)  in order to demo increased fascial tensigrity and pelvic floor strength to minimize worsening of prolapse symptoms.    Time 12   Period Weeks   Status Partially Met   PT LONG TERM GOAL  #9   TITLE Pt will demo more pronation on stance phase bilaterally in order to improe balance with walking   Time 12   Period Weeks   Status New               Plan - 11/29/15 2106    Clinical Impression Statement Pt continued to show improvement with a more caudal position of her bladder following manual Tx over her abdominal scar. Focused on gait training today and pt demo'd provided gait pattern with excessive cues for increased pronated with stance phase bilaterally. Pt will continue to benefit from skilled PT.    Pt will benefit from skilled  therapeutic  intervention in order to improve on the following deficits Decreased strength;Increased muscle spasms;Improper body mechanics;Difficulty walking;Decreased activity tolerance;Decreased balance;Decreased coordination;Decreased endurance;Decreased scar mobility;Increased fascial restricitons;Pain;Decreased safety awareness;Decreased range of motion;Decreased mobility;Impaired flexibility;Postural dysfunction   Rehab Potential Good   PT Frequency 1x / week   PT Duration 12 weeks   PT Treatment/Interventions ADLs/Self Care Home Management;Aquatic Therapy;Biofeedback;Cryotherapy;Electrical Stimulation;Moist Heat;Traction;Patient/family education;Neuromuscular re-education;Balance training;Therapeutic exercise;Therapeutic activities;Stair training;Gait training;Functional mobility training;Manual techniques;Scar mobilization;Passive range of motion;Energy conservation;Taping   Consulted and Agree with Plan of Care Patient        Problem List Patient Active Problem List   Diagnosis Date Noted  . Urinary frequency 09/16/2015  . Bladder prolapse, female, acquired 09/08/2015  . DDD (degenerative disc disease), lumbar 01/25/2015  . Sacroiliac joint dysfunction of both sides 01/25/2015  . Spinal stenosis, lumbar region, with neurogenic claudication 01/25/2015  . Facet syndrome, lumbar 01/25/2015  . Lumbar radiculopathy 01/25/2015  . Gallstones 12/23/2014  . Polyarthritis 03/16/2014  . Thrombocytopenia ([place]) 09/11/2013  . Other malaise and fatigue 09/10/2013  . Major depressive disorder, single episode 09/10/2013  . Raynaud phenomenon 04/17/2013  . Low back pain 12/05/2012  . Shortness of breath 06/26/2012  . Aborted cardiac arrest   . ICD-Medtronic   . PVC's / ventricular tachycardia   . 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 Irritable bowel syndrome     [name] ,PT, DPT, E-RYT  11/29/2015, 9:13 PM  [place] MAIN  [facility] SERVICES [address] [place], [place], [address] Phone: [phone]   Fax:  [phone]  Name: MARJA ADDERLEY MRN: [id number] Date of Birth: Aug 14, 1956

## 2015-12-05 ENCOUNTER — Ambulatory Visit: Payer: 59 | Attending: Urology | Admitting: Physical Therapy

## 2015-12-05 DIAGNOSIS — M79671 Pain in right foot: Secondary | ICD-10-CM | POA: Diagnosis not present

## 2015-12-05 DIAGNOSIS — M629 Disorder of muscle, unspecified: Secondary | ICD-10-CM | POA: Insufficient documentation

## 2015-12-05 DIAGNOSIS — R293 Abnormal posture: Secondary | ICD-10-CM | POA: Insufficient documentation

## 2015-12-05 DIAGNOSIS — M6281 Muscle weakness (generalized): Secondary | ICD-10-CM | POA: Diagnosis not present

## 2015-12-05 DIAGNOSIS — R279 Unspecified lack of coordination: Secondary | ICD-10-CM | POA: Diagnosis not present

## 2015-12-05 DIAGNOSIS — M79672 Pain in left foot: Secondary | ICD-10-CM | POA: Diagnosis not present

## 2015-12-05 DIAGNOSIS — N8189 Other female genital prolapse: Secondary | ICD-10-CM | POA: Insufficient documentation

## 2015-12-05 NOTE — Patient Instructions (Addendum)
Back release sequence:  Figure 4 and cross over with belt under thighs  (or seated)    Knees to chest   (or standing version: foot on bench)    Reclined twist with shifting hips to the side first before dropping knees  To opposite side of hip shift  (or standing with foot on bench and then twist)    Rolling hips in socket with knees straight and belt under ballmound  (bring foot across midline and out 30 deg) other knee bent    Restorative with block under hips 10 min   [place]    _________________________________________________-----  Energy conservation (chunk activity)  Take a break before completing the half

## 2015-12-06 NOTE — Therapy (Signed)
[place] MAIN [facility] SERVICES [address] [place], [place], [address] Phone: [phone]   Fax:  [phone]  Physical Therapy Treatment  Patient Details  Name: Nicole Swanson MRN: [id number] Date of Birth: [birth year] Referring Provider: Matilde Sprang  Encounter Date: 12/05/2015      PT End of Session - 12/06/15 2337    Visit Number 8   Number of Visits 12   Date for PT Re-Evaluation 12/26/15   PT Start Time 1749   PT Stop Time 1655   PT Time Calculation (min) 50 min   Activity Tolerance Patient tolerated treatment well;No increased pain   Behavior During Therapy [facility] for tasks assessed/performed      Past Medical History  Diagnosis Date  . Aborted cardiac arrest 1994  . DVT (deep vein thrombosis) in pregnancy 1986    had heparin shots followed by coumadin  . Dual implantable cardiac defibrillator     Medtronic initially implanted in 1997  . 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 Irritable bowel syndrome   . PVC's (premature ventricular contractions)   . Ventricular tachycardia ([place])   . Family history of seizures     Question neurological versus cardiac  . Allergy   . Heart murmur     Past Surgical History  Procedure Laterality Date  . Cardiac defibrillator placement  1994    Dual chamber with pacemaker  . Nasal septum surgery  2006    [facility]   . Abdominal hysterectomy    . Carpal tunnel release Right   . Laparoscopic lysis of adhesions    . Appendectomy    . Pacemaker insertion      5 different devices  . Oophorectomy      There were no vitals filed for this visit.  Visit Diagnosis:  Pelvic floor weakness  Fascial defect  Poor posture  Pain in both feet  Lack of coordination      Subjective Assessment - 12/05/15 1612    Subjective Pt reported LBP yesterday 6/10 and today 3-4/10 after changing air fliters in her home. Pt had difficulty with sitting the next day.    Pertinent History  Hx; cardiac conditions, Hx of abdominal surgeries,    Patient Stated Goals relief from prolapse, LBP, feet pain and to gain stabilty in core             [facility] PT Assessment - 12/06/15 2336    ROM / Strength   AROM / PROM / Strength --  L rotation, sidebend with p! pre Tx, no pain post Tx   AROM   Overall AROM Comments hip add/IR with knee extended with strap in supine limited compared to abd/ER bilaterally   Flexibility   Piriformis L abd/ ER more limited > R   Palpation   SI assessment  L PSIS tender and tensions noted                      [facility] Adult PT Treatment/Exercise - 12/06/15 2335    Therapeutic Activites    Therapeutic Activities --  see pt instructions for management of back pain                PT Education - 12/06/15 2337    Education provided Yes   Education Details HEP   Person(s) Educated Patient   Methods Explanation;Demonstration;Tactile cues;Verbal cues;Handout   Comprehension Returned demonstration;Verbalized understanding  PT Long Term Goals - 11/30/15 1222    PT LONG TERM GOAL #1   Title Pt will decrease her ODI score from 16% to < 10% in order to return to ADLs. (11/28/15: ODI 16%,    Time 12   Period Weeks   Status On-going   PT LONG TERM GOAL #2   Title Pt will decrease her PFDI score from 46% to < 36% in order optimize pelvic floor function for bowel and urination   (11/28/15: PFDI 30% ).   Time 12   Period Weeks   Status On-going   PT LONG TERM GOAL #3   Title Pt will demo increased scar mobility over abdomen in order to decrease pain and perform hip flexion without wincing.    Time 12   Period Weeks   Status Achieved   PT LONG TERM GOAL #4   Title Pt will demo 5/5 hip flexion bilaterally in order to walk and perform sit to stand efficiently.    Time 12   Period Weeks   Status On-going   PT LONG TERM GOAL #5   Title Pt will demo no lumbopelvic instability with deep core level 1-4 (5 reps ) in order  to improve balance and withstand load bearing forces through spine with carrying groceries.   Time 12   Period Weeks   Status On-going   PT LONG TERM GOAL #6   Title Pt will demo increased SLS time bilaterally to > 30 sec to minimize risk for falls.    Time 12   Period Weeks   Status On-going   PT LONG TERM GOAL #7   Title Pt will report no feet pain when getting out of bed and stepping on to the floor in order to transfer safely.   Time 12   Period Weeks   Status Achieved   PT LONG TERM GOAL #8   Title Pt willl demo more caudal position of bladder in standing (PT palpating w/ closure of pelvic floor muscles at a Grade 3)  in order to demo increased fascial tensigrity and pelvic floor strength to minimize worsening of prolapse symptoms.    Time 12   Period Weeks   Status Partially Met   PT LONG TERM GOAL  #9   TITLE Pt will demo more pronation on stance phase bilaterally in order to improe balance with walking   Time 12   Period Weeks   Status New               Plan - 12/06/15 2338    Clinical Impression Statement Pt showed SIJ asymmetries related to her relapse of LBP after climbing ladder and changing home air filters. Pt was able to alleviate pain with customized stretches and showed improved hip ROM and spinal ROM post-Tx. Pt will continue to benefit from skilled PT.   Pt will benefit from skilled therapeutic intervention in order to improve on the following deficits Decreased strength;Increased muscle spasms;Improper body mechanics;Difficulty walking;Decreased activity tolerance;Decreased balance;Decreased coordination;Decreased endurance;Decreased scar mobility;Increased fascial restricitons;Pain;Decreased safety awareness;Decreased range of motion;Decreased mobility;Impaired flexibility;Postural dysfunction   Rehab Potential Good   PT Frequency 1x / week   PT Duration 12 weeks   PT Treatment/Interventions ADLs/Self Care Home Management;Aquatic  Therapy;Biofeedback;Cryotherapy;Electrical Stimulation;Moist Heat;Traction;Patient/family education;Neuromuscular re-education;Balance training;Therapeutic exercise;Therapeutic activities;Stair training;Gait training;Functional mobility training;Manual techniques;Scar mobilization;Passive range of motion;Energy conservation;Taping   Consulted and Agree with Plan of Care Patient        Problem List Patient Active Problem List   Diagnosis  Date Noted  . Urinary frequency 09/16/2015  . Bladder prolapse, female, acquired 09/08/2015  . DDD (degenerative disc disease), lumbar 01/25/2015  . Sacroiliac joint dysfunction of both sides 01/25/2015  . Spinal stenosis, lumbar region, with neurogenic claudication 01/25/2015  . Facet syndrome, lumbar 01/25/2015  . Lumbar radiculopathy 01/25/2015  . Gallstones 12/23/2014  . Polyarthritis 03/16/2014  . Thrombocytopenia ([place]) 09/11/2013  . Other malaise and fatigue 09/10/2013  . Major depressive disorder, single episode 09/10/2013  . Raynaud phenomenon 04/17/2013  . Low back pain 12/05/2012  . Shortness of breath 06/21/2012  . Aborted cardiac arrest   . ICD-Medtronic   . PVC's / ventricular tachycardia   . 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 Irritable bowel syndrome     [name] ,PT, DPT, E-RYT  12/06/2015, 11:40 PM  [place] MAIN [facility] SERVICES [address] [place], [place], [address] Phone: [phone]   Fax:  [phone]  Name: Nicole Swanson MRN: [id number] Date of Birth: 01-Sep-1956

## 2015-12-13 ENCOUNTER — Ambulatory Visit: Payer: 59 | Admitting: Physical Therapy

## 2015-12-19 ENCOUNTER — Ambulatory Visit: Payer: 59 | Admitting: Physical Therapy

## 2015-12-19 DIAGNOSIS — M79671 Pain in right foot: Secondary | ICD-10-CM | POA: Diagnosis not present

## 2015-12-19 DIAGNOSIS — R293 Abnormal posture: Secondary | ICD-10-CM | POA: Diagnosis not present

## 2015-12-19 DIAGNOSIS — M6281 Muscle weakness (generalized): Secondary | ICD-10-CM

## 2015-12-19 DIAGNOSIS — N8189 Other female genital prolapse: Secondary | ICD-10-CM | POA: Diagnosis not present

## 2015-12-19 DIAGNOSIS — R279 Unspecified lack of coordination: Secondary | ICD-10-CM

## 2015-12-19 DIAGNOSIS — M629 Disorder of muscle, unspecified: Secondary | ICD-10-CM | POA: Diagnosis not present

## 2015-12-19 DIAGNOSIS — M79672 Pain in left foot: Secondary | ICD-10-CM | POA: Diagnosis not present

## 2015-12-19 NOTE — Patient Instructions (Signed)
Discontinue floor exercises. briding with band, and deep core series  Bed:  Elbow bent exercise to focus on 4 sucker cups on bed (decrease arch of back) 10 x Bridges with 5 sec pelvic floor holds x 10 reps  Sideplank modified (knees bent) ---clam shells R x 20 , L x 10  Dolpin plank (on forearms, fingers inter laced)    10 x     Functional:  Sit to stand: heels behind knees, nose over toes, in hale smell roses and exhale scoop hips under to rise with pelvic floor activation on rise    To activate top triangle of pelvic floor: Relax low belly on inhale, (don't subconsiously hold tight)  Massage (garden series)    Call GYN MD for an appointment to get pessary fitted.

## 2015-12-21 NOTE — Therapy (Signed)
[place] MAIN [facility] SERVICES [address] [place], [place], [address] Phone: [phone]   Fax:  [phone]  Physical Therapy Treatment  Patient Details  Name: Nicole Swanson MRN: [id number] Date of Birth: 02-17-1956 Referring Provider: Matilde Sprang  Encounter Date: 12/19/2015      PT End of Session - 12/21/15 0002    Visit Number 9   Number of Visits 12   Date for PT Re-Evaluation 12/26/15   PT Start Time 1027   PT Stop Time 2536   PT Time Calculation (min) 69 min   Activity Tolerance Patient tolerated treatment well;No increased pain   Behavior During Therapy [facility] for tasks assessed/performed      Past Medical History  Diagnosis Date  . Aborted cardiac arrest 1994  . DVT (deep vein thrombosis) in pregnancy 1986    had heparin shots followed by coumadin  . Dual implantable cardiac defibrillator     Medtronic initially implanted in 1997  . 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 Irritable bowel syndrome   . PVC's (premature ventricular contractions)   . Ventricular tachycardia ([place])   . Family history of seizures     Question neurological versus cardiac  . Allergy   . Heart murmur     Past Surgical History  Procedure Laterality Date  . Cardiac defibrillator placement  1994    Dual chamber with pacemaker  . Nasal septum surgery  2006    [facility]   . Abdominal hysterectomy    . Carpal tunnel release Right   . Laparoscopic lysis of adhesions    . Appendectomy    . Pacemaker insertion      5 different devices  . Oophorectomy      There were no vitals filed for this visit.      Subjective Assessment - 12/21/15 0012    Subjective Pt continues to not notice prolapse Sx when sitting or laying down anymore, only with standing and walking. Pt had noticed upper back pain with band exercise and has discontinued it and replaced it with bridging exercise. Pt has progressed to forearm planks on her own and  inquired about it today. Pt has been performing yoga poses and pelvic floor exercises.  Pt has been transitioning to floor for yoga poses 3-4x each day.  Pt will be traveling out of the country late May.    Pertinent History Hx; cardiac conditions, Hx of abdominal surgeries,    Patient Stated Goals relief from prolapse, LBP, feet pain and to gain stabilty in core             [facility] PT Assessment - 12/21/15 0007    Observation/Other Assessments   Observations noted increased lower abdominal tensions                  Pelvic Floor Special Questions - 12/21/15 0008    Prolapse other standing: bladder at introitus, posterior lift > anterior, no closure of pelvic floor with cue for contraction.    Pelvic Floor Internal Exam pt consented verbally without contraindications    Exam Type Vaginal   Strength weak squeeze, no lift  posterior contraction > ant   Biofeedback without pillow, noted no closure of deep pelvic floor mm           [facility] Adult PT Treatment/Exercise - 12/21/15 0005    Therapeutic Activites    Therapeutic Activities --   referral pessary fitting, sit<>stand, decr. floor <> rise  Neuro Re-ed    Neuro Re-ed Details  se pt instructions   Exercises   Exercises --  see pt instructions                PT Education - 12/21/15 0015    Education provided Yes   Education Details HEP, discussed pessary use, loaded demand with floor <> rise t/f and to minimize this activity to minimize relapse of Sx.    Person(s) Educated Patient   Methods Explanation;Demonstration;Tactile cues;Verbal cues;Handout   Comprehension Returned demonstration;Verbalized understanding             PT Long Term Goals - 12/21/15 0027    PT LONG TERM GOAL #1   Title Pt will decrease her ODI score from 16% to < 10% in order to return to ADLs.    Time 12   Period Weeks   Status Partially Met   PT LONG TERM GOAL #2   Title Pt will decrease her PFDI score from 46% to < 36% in  order optimize pelvic floor function for bowel and urination   (11/28/15: PFDI 30% ).   Time 12   Period Weeks   Status Partially Met   PT LONG TERM GOAL #3   Title Pt will demo increased scar mobility over abdomen in order to decrease pain and perform hip flexion without wincing.    Time 12   Period Weeks   Status Achieved   PT LONG TERM GOAL #4   Title Pt will demo 5/5 hip flexion bilaterally in order to walk and perform sit to stand efficiently.    Time 12   Period Weeks   Status Partially Met   PT LONG TERM GOAL #5   Title Pt will demo no lumbopelvic instability with deep core level 1-4 (5 reps ) in order to improve balance and withstand load bearing forces through spine with carrying groceries.   Time 12   Period Weeks   Status Achieved   PT LONG TERM GOAL #6   Title Pt will demo increased SLS time bilaterally to > 30 sec to minimize risk for falls.    Time 12   Period Weeks   Status Achieved.   PT LONG TERM GOAL #7   Title Pt will report no feet pain when getting out of bed and stepping on to the floor in order to transfer safely.   Time 12   Period Weeks   Status Achieved   PT LONG TERM GOAL #8   Title Pt willl demo more caudal position of bladder in standing (PT palpating w/ closure of pelvic floor muscles at a Grade 3)  in order to demo increased fascial tensigrity and pelvic floor strength to minimize worsening of prolapse symptoms.    Time 12   Period Weeks   Status Partially Met   PT LONG TERM GOAL  #9   TITLE Pt will demo more pronation on stance phase bilaterally in order to improve balance with walking   Time 12   Period Weeks   Status Partially Met               Plan - 12/21/15 0016    Clinical Impression Statement Pt has achieved 4/9 goals and has partially met her remaining goals. Today, pt showed significantly improved postural stability and balance. Across prior visits, pt showed significantly improved pelvic floor strength without pelvic floor mm  tensions. However, pt missed last week's appt and she returned today with decreased pelvic floor  strength and standing position showed increased lower position of bladder, possibly due to increased floor <> rise transfers over the past two weeks. Pt's exercises were modified. PT educated pt re: pessary use for optimal outcomes especially with anticipated increased walking on her future foreign trip. Pt voiced interest in this option. PT called pt's gynceologist  office w/ this recommendation. Pt was able to get an appt. Plan to continue helping pt progress towards her remaining goals with skilled PT.   Rehab Potential Good   PT Frequency 1x / week   PT Duration 12 weeks   PT Treatment/Interventions ADLs/Self Care Home Management;Aquatic Therapy;Biofeedback;Cryotherapy;Electrical Stimulation;Moist Heat;Traction;Patient/family education;Neuromuscular re-education;Balance training;Therapeutic exercise;Therapeutic activities;Stair training;Gait training;Functional mobility training;Manual techniques;Scar mobilization;Passive range of motion;Energy conservation;Taping   Consulted and Agree with Plan of Care Patient      Patient will benefit from skilled therapeutic intervention in order to improve the following deficits and impairments:  Decreased strength, Increased muscle spasms, Improper body mechanics, Difficulty walking, Decreased activity tolerance, Decreased balance, Decreased coordination, Decreased endurance, Decreased scar mobility, Increased fascial restricitons, Pain, Decreased safety awareness, Decreased range of motion, Decreased mobility, Impaired flexibility, Postural dysfunction  Visit Diagnosis: Muscle weakness (generalized)  Abnormal posture  Unspecified lack of coordination     Problem List Patient Active Problem List   Diagnosis Date Noted  . Urinary frequency 09/16/2015  . Bladder prolapse, female, acquired 09/08/2015  . DDD (degenerative disc disease), lumbar 01/25/2015   . Sacroiliac joint dysfunction of both sides 01/25/2015  . Spinal stenosis, lumbar region, with neurogenic claudication 01/25/2015  . Facet syndrome, lumbar 01/25/2015  . Lumbar radiculopathy 01/25/2015  . Gallstones 12/23/2014  . Polyarthritis 03/16/2014  . Thrombocytopenia ([place]) 09/11/2013  . Other malaise and fatigue 09/10/2013  . Major depressive disorder, single episode 09/10/2013  . Raynaud phenomenon 04/17/2013  . Low back pain 12/05/2012  . Shortness of breath 06/14/2012  . Aborted cardiac arrest   . ICD-Medtronic   . PVC's / ventricular tachycardia   . 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 Irritable bowel syndrome     [name] ,PT, DPT, E-RYT  12/21/2015, 12:31 AM  [place] MAIN [facility] SERVICES [address] [place], [place], [address] Phone: [phone]   Fax:  [phone]  Name: Nicole Swanson MRN: [id number] Date of Birth: 1955-10-29

## 2015-12-27 DIAGNOSIS — H5213 Myopia, bilateral: Secondary | ICD-10-CM | POA: Diagnosis not present

## 2015-12-28 ENCOUNTER — Ambulatory Visit: Payer: 59 | Admitting: Physical Therapy

## 2015-12-28 DIAGNOSIS — M79672 Pain in left foot: Secondary | ICD-10-CM | POA: Diagnosis not present

## 2015-12-28 DIAGNOSIS — R279 Unspecified lack of coordination: Secondary | ICD-10-CM | POA: Diagnosis not present

## 2015-12-28 DIAGNOSIS — M6281 Muscle weakness (generalized): Secondary | ICD-10-CM

## 2015-12-28 DIAGNOSIS — M629 Disorder of muscle, unspecified: Secondary | ICD-10-CM | POA: Diagnosis not present

## 2015-12-28 DIAGNOSIS — N8189 Other female genital prolapse: Secondary | ICD-10-CM | POA: Diagnosis not present

## 2015-12-28 DIAGNOSIS — R293 Abnormal posture: Secondary | ICD-10-CM

## 2015-12-28 DIAGNOSIS — M79671 Pain in right foot: Secondary | ICD-10-CM | POA: Diagnosis not present

## 2015-12-29 ENCOUNTER — Encounter: Payer: 59 | Admitting: Physical Therapy

## 2015-12-29 NOTE — Patient Instructions (Signed)
Engaging in feet with walking Finding pelvic neutral in standing  Continue with pelvic floor muscles with pillow under hips Continue with low abdominal mm massage

## 2015-12-29 NOTE — Therapy (Signed)
[place] MAIN [facility] SERVICES [address] [place], [place], [address] Phone: [phone]   Fax:  [phone]  Physical Therapy Treatment  Patient Details  Name: Nicole Swanson MRN: [id number] Date of Birth: [birth year] Referring Provider: Matilde Sprang  Encounter Date: 12/28/2015      PT End of Session - 12/29/15 0952    Visit Number 10   Number of Visits 12   Date for PT Re-Evaluation 02/29/16   PT Start Time 8864   PT Stop Time 8472   PT Time Calculation (min) 65 min   Activity Tolerance Patient tolerated treatment well;No increased pain   Behavior During Therapy [facility] for tasks assessed/performed      Past Medical History  Diagnosis Date  . Aborted cardiac arrest 1994  . DVT (deep vein thrombosis) in pregnancy 1986    had heparin shots followed by coumadin  . Dual implantable cardiac defibrillator     Medtronic initially implanted in 1997  . 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 Irritable bowel syndrome   . PVC's (premature ventricular contractions)   . Ventricular tachycardia ([place])   . Family history of seizures     Question neurological versus cardiac  . Allergy   . Heart murmur     Past Surgical History  Procedure Laterality Date  . Cardiac defibrillator placement  1994    Dual chamber with pacemaker  . Nasal septum surgery  2006    [facility]   . Abdominal hysterectomy    . Carpal tunnel release Right   . Laparoscopic lysis of adhesions    . Appendectomy    . Pacemaker insertion      5 different devices  . Oophorectomy      There were no vitals filed for this visit.      Subjective Assessment - 12/29/15 0944    Subjective Pt notices her prolapse Sx more this week compared this previous week. Pt's back is doing better.    Pertinent History Hx; cardiac conditions, Hx of abdominal surgeries,    Patient Stated Goals relief from prolapse, LBP, feet pain and to gain stabilty in core              [facility] PT Assessment - 12/29/15 0944    Observation/Other Assessments   Observations posterior tilt of pelvic, poor disassociation between pelvis and s upper trunk. (increased ability to hinge at waist with cuing)   Palpation   Palpation comment increased fascial restrictions over pubic bone, suprapubic bone (decreased post Tx with increased ability to faciliate lift of pelvic floor mm with TrA activation)                   Pelvic Floor Special Questions - 12/29/15 0945    Prolapse other standing: posterior tilt of pelvis w/ poor activation of pelvic floor mm anterior, cued for pelvic neutral position: increased lifting of pelvic floor and more caudal positon of pelvic organs   Pelvic Floor Internal Exam pt consented verbally without contraindications    Exam Type Vaginal   Sensation tenderness in posterior mm (decreased post Tx)    Strength fair squeeze, definite lift  post Tx w/ suprapubic fascial releases,           [facility] Adult PT Treatment/Exercise - 12/29/15 0721    Neuro Re-ed    Neuro Re-ed Details  pelvic tilt with dissociation from upper trunk, engaging intrinsic feet muscles with gait    Manual Therapy  Myofascial Release suprapubic area, iliococcygeus B                PT Education - 12/29/15 0951    Education provided Yes   Education Details HEP   Person(s) Educated Patient   Methods Explanation;Demonstration;Tactile cues;Verbal cues   Comprehension Returned demonstration;Verbalized understanding             PT Long Term Goals - 12/21/15 0027    PT LONG TERM GOAL #1   Title Pt will decrease her ODI score from 16% to < 10% in order to return to ADLs.    Time 12   Period Weeks   Status Partially Met   PT LONG TERM GOAL #2   Title Pt will decrease her PFDI score from 46% to < 36% in order optimize pelvic floor function for bowel and urination   (11/28/15: PFDI 30% ).   Time 12   Period Weeks   Status Partially Met   PT LONG TERM GOAL  #3   Title Pt will demo increased scar mobility over abdomen in order to decrease pain and perform hip flexion without wincing.    Time 12   Period Weeks   Status Achieved   PT LONG TERM GOAL #4   Title Pt will demo 5/5 hip flexion bilaterally in order to walk and perform sit to stand efficiently.    Time 12   Period Weeks   Status Partially Met   PT LONG TERM GOAL #5   Title Pt will demo no lumbopelvic instability with deep core level 1-4 (5 reps ) in order to improve balance and withstand load bearing forces through spine with carrying groceries.   Time 12   Period Weeks   Status Achieved   PT LONG TERM GOAL #6   Title Pt will demo increased SLS time bilaterally to > 30 sec to minimize risk for falls.    Time 12   Period Weeks   Status Achieved   PT LONG TERM GOAL #7   Title Pt will report no feet pain when getting out of bed and stepping on to the floor in order to transfer safely.   Time 12   Period Weeks   Status Achieved   PT LONG TERM GOAL #8   Title Pt willl demo more caudal position of bladder in standing (PT palpating w/ closure of pelvic floor muscles at a Grade 3)  in order to demo increased fascial tensigrity and pelvic floor strength to minimize worsening of prolapse symptoms.    Time 12   Period Weeks   Status Partially Met   PT LONG TERM GOAL  #9   TITLE Pt will demo more pronation on stance phase bilaterally in order to improve balance with walking   Time 12   Period Weeks   Status Partially Met               Plan - 12/29/15 0953    Clinical Impression Statement Pt showed improved lifting of pelvic floor and pelvic organs after gaining increased ability to engage in her feet intrinsic mm, lower abdominal wall, and anterior pelvic floor mm with manual Tx and neuro-muscular re-edu today. Pt reported her prolapse Sx did not bother her after session. Pt will continue to require skilled PT  to decrease worsening of prolapse Sx.  Plan to use resisted weight  walking with cable weights at her waist at next session.   Rehab Potential Good   PT Frequency 1x /  week   PT Duration 12 weeks   PT Treatment/Interventions ADLs/Self Care Home Management;Aquatic Therapy;Biofeedback;Cryotherapy;Electrical Stimulation;Moist Heat;Traction;Patient/family education;Neuromuscular re-education;Balance training;Therapeutic exercise;Therapeutic activities;Stair training;Gait training;Functional mobility training;Manual techniques;Scar mobilization;Passive range of motion;Energy conservation;Taping   Consulted and Agree with Plan of Care Patient      Patient will benefit from skilled therapeutic intervention in order to improve the following deficits and impairments:  Decreased strength, Increased muscle spasms, Improper body mechanics, Difficulty walking, Decreased activity tolerance, Decreased balance, Decreased coordination, Decreased endurance, Decreased scar mobility, Increased fascial restricitons, Pain, Decreased safety awareness, Decreased range of motion, Decreased mobility, Impaired flexibility, Postural dysfunction  Visit Diagnosis: Abnormal posture  Unspecified lack of coordination  Muscle weakness (generalized)     Problem List Patient Active Problem List   Diagnosis Date Noted  . Urinary frequency 09/16/2015  . Bladder prolapse, female, acquired 09/08/2015  . DDD (degenerative disc disease), lumbar 01/25/2015  . Sacroiliac joint dysfunction of both sides 01/25/2015  . Spinal stenosis, lumbar region, with neurogenic claudication 01/25/2015  . Facet syndrome, lumbar 01/25/2015  . Lumbar radiculopathy 01/25/2015  . Gallstones 12/23/2014  . Polyarthritis 03/16/2014  . Thrombocytopenia ([place]) 09/11/2013  . Other malaise and fatigue 09/10/2013  . Major depressive disorder, single episode 09/10/2013  . Raynaud phenomenon 04/17/2013  . Low back pain 12/05/2012  . Shortness of breath 06/08/2012  . Aborted cardiac arrest   . ICD-Medtronic   . PVC's  / ventricular tachycardia   . 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 Irritable bowel syndrome     [name] ,PT, DPT, E-RYT  12/29/2015, 9:56 AM  [place] MAIN [facility] SERVICES [address] [place], [place], [address] Phone: [phone]   Fax:  [phone]  Name: RANADA VIGORITO MRN: [id number] Date of Birth: 1955/12/14

## 2016-01-02 ENCOUNTER — Ambulatory Visit: Payer: 59 | Attending: Urology | Admitting: Physical Therapy

## 2016-01-02 DIAGNOSIS — M6281 Muscle weakness (generalized): Secondary | ICD-10-CM | POA: Diagnosis not present

## 2016-01-02 DIAGNOSIS — N8189 Other female genital prolapse: Secondary | ICD-10-CM | POA: Insufficient documentation

## 2016-01-02 DIAGNOSIS — R279 Unspecified lack of coordination: Secondary | ICD-10-CM | POA: Diagnosis not present

## 2016-01-02 DIAGNOSIS — R293 Abnormal posture: Secondary | ICD-10-CM | POA: Insufficient documentation

## 2016-01-02 NOTE — Patient Instructions (Addendum)
"  W"  With yellow band  10 x 3    Progress the bridge with Red band at thighs 10 x 3    Pelvic floor 6 reps 10 sec with awareness of top triangle drawing up and in with kegel    Deep core level 3 (10 x 3) Leg lifts  instead of plank   _______________   Standing: multifidis twist, perpendicular to door  Yellow band doubled   10 x 3

## 2016-01-03 DIAGNOSIS — N8110 Cystocele, unspecified: Secondary | ICD-10-CM | POA: Diagnosis not present

## 2016-01-04 NOTE — Therapy (Addendum)
[place] MAIN [facility] SERVICES [address] [place], [place], [address] Phone: [phone]   Fax:  [phone]  Physical Therapy Treatment  Patient Details  Name: Nicole Swanson MRN: [id number] Date of Birth: Sep 05, 1955 Referring Provider: Matilde Sprang  Encounter Date: 01/02/2016      PT End of Session - 01/05/16 2146    Visit Number 11   Number of Visits 12   Date for PT Re-Evaluation 02/29/16   PT Start Time 1610   PT Stop Time 3536   PT Time Calculation (min) 65 min   Activity Tolerance Patient tolerated treatment well;No increased pain   Behavior During Therapy [facility] for tasks assessed/performed      Past Medical History  Diagnosis Date  . Aborted cardiac arrest 1994  . DVT (deep vein thrombosis) in pregnancy 1986    had heparin shots followed by coumadin  . Dual implantable cardiac defibrillator     Medtronic initially implanted in 1997  . 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 Irritable bowel syndrome   . PVC's (premature ventricular contractions)   . Ventricular tachycardia ([place])   . Family history of seizures     Question neurological versus cardiac  . Allergy   . Heart murmur     Past Surgical History  Procedure Laterality Date  . Cardiac defibrillator placement  1994    Dual chamber with pacemaker  . Nasal septum surgery  2006    [facility]   . Abdominal hysterectomy    . Carpal tunnel release Right   . Laparoscopic lysis of adhesions    . Appendectomy    . Pacemaker insertion      5 different devices  . Oophorectomy      There were no vitals filed for this visit.      Subjective Assessment - 01/05/16 2153    Subjective Pt reported her prolapse Sx did not bother her when she was conscious of lifting her pelvic floor up but once she focuses on other things, she notices it again. Pt has not felt a change in her pressure feeling while walking and standing.    Pertinent History Hx; cardiac  conditions, Hx of abdominal surgeries,    Patient Stated Goals relief from prolapse, LBP, feet pain and to gain stabilty in core                       Pelvic Floor Special Questions - 01/05/16 2139    Pelvic Floor Internal Exam pt consented verbally without contraindications    Exam Type Vaginal   Sensation Anterior pelvic floor lift after suprapubic /internal fascial releases. More circumferential contraction   Strength fair squeeze, definite lift            [facility] Adult PT Treatment/Exercise - 01/05/16 2145    Neuro Re-ed    Neuro Re-ed Details  see pt instructions   Exercises   Exercises --  see pt instructions   Manual Therapy   Myofascial Release suprapubic, pubic, abdominal scar (externally), anterior mm (internally) = fascial releases                PT Education - 01/05/16 2146    Education provided Yes   Education Details HEP   Person(s) Educated Patient   Methods Explanation;Demonstration;Tactile cues;Verbal cues;Handout   Comprehension Returned demonstration;Verbalized understanding             PT Long Term Goals - 12/21/15 1443  PT LONG TERM GOAL #1   Title Pt will decrease her ODI score from 16% to < 10% in order to return to ADLs.    Time 12   Period Weeks   Status Partially Met   PT LONG TERM GOAL #2   Title Pt will decrease her PFDI score from 46% to < 36% in order optimize pelvic floor function for bowel and urination   (11/28/15: PFDI 30% ).   Time 12   Period Weeks   Status Partially Met   PT LONG TERM GOAL #3   Title Pt will demo increased scar mobility over abdomen in order to decrease pain and perform hip flexion without wincing.    Time 12   Period Weeks   Status Achieved   PT LONG TERM GOAL #4   Title Pt will demo 5/5 hip flexion bilaterally in order to walk and perform sit to stand efficiently.    Time 12   Period Weeks   Status Partially Met   PT LONG TERM GOAL #5   Title Pt will demo no lumbopelvic  instability with deep core level 1-4 (5 reps ) in order to improve balance and withstand load bearing forces through spine with carrying groceries.   Time 12   Period Weeks   Status Achieved   PT LONG TERM GOAL #6   Title Pt will demo increased SLS time bilaterally to > 30 sec to minimize risk for falls.    Time 12   Period Weeks   Status Achieved   PT LONG TERM GOAL #7   Title Pt will report no feet pain when getting out of bed and stepping on to the floor in order to transfer safely.   Time 12   Period Weeks   Status Achieved   PT LONG TERM GOAL #8   Title Pt willl demo more caudal position of bladder in standing (PT palpating w/ closure of pelvic floor muscles at a Grade 3)  in order to demo increased fascial tensigrity and pelvic floor strength to minimize worsening of prolapse symptoms.    Time 12   Period Weeks   Status Partially Met   PT LONG TERM GOAL  #9   TITLE Pt will demo more pronation on stance phase bilaterally in order to improve balance with walking   Time 12   Period Weeks   Status Partially Met               Plan - 01/05/16 2147    Clinical Impression Statement Pt showed more caudal position of her pelvic organs prior to Tx, showing good carry over from last session's treatments. Pt showed more activation of anterior pelvic floor mm following fascial releases over suprapubic/ scar area, pubic symphysis, and internal anterior pelvic floor mm. Anticipate pt will benefit from continued PT to decrease scar adhesions and faciliate increased mobility of pelvic floor mm. Pt continues to manintain good pelvic floor strength.  Suspect pt's pessary fitting will be beneficial as she will be performing long periods of walking [place].     Rehab Potential Good   PT Frequency 1x / week   PT Duration 12 weeks   PT Treatment/Interventions ADLs/Self Care Home Management;Aquatic Therapy;Biofeedback;Cryotherapy;Electrical Stimulation;Moist  Heat;Traction;Patient/family education;Neuromuscular re-education;Balance training;Therapeutic exercise;Therapeutic activities;Stair training;Gait training;Functional mobility training;Manual techniques;Scar mobilization;Passive range of motion;Energy conservation;Taping   Consulted and Agree with Plan of Care Patient      Patient will benefit from skilled therapeutic intervention in order to  improve the following deficits and impairments:  Decreased strength, Increased muscle spasms, Improper body mechanics, Difficulty walking, Decreased activity tolerance, Decreased balance, Decreased coordination, Decreased endurance, Decreased scar mobility, Increased fascial restricitons, Pain, Decreased safety awareness, Decreased range of motion, Decreased mobility, Impaired flexibility, Postural dysfunction  Visit Diagnosis: Abnormal posture  Unspecified lack of coordination  Muscle weakness (generalized)  Pelvic floor weakness     Problem List Patient Active Problem List   Diagnosis Date Noted  . Urinary frequency 09/16/2015  . Bladder prolapse, female, acquired 09/08/2015  . DDD (degenerative disc disease), lumbar 01/25/2015  . Sacroiliac joint dysfunction of both sides 01/25/2015  . Spinal stenosis, lumbar region, with neurogenic claudication 01/25/2015  . Facet syndrome, lumbar 01/25/2015  . Lumbar radiculopathy 01/25/2015  . Gallstones 12/23/2014  . Polyarthritis 03/16/2014  . Thrombocytopenia ([place]) 09/11/2013  . Other malaise and fatigue 09/10/2013  . Major depressive disorder, single episode 09/10/2013  . Raynaud phenomenon 04/17/2013  . Low back pain 12/05/2012  . Shortness of breath 06/19/2012  . Aborted cardiac arrest   . ICD-Medtronic   . PVC's / ventricular tachycardia   . 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 Irritable bowel syndrome     [name] ,PT, DPT, E-RYT  01/05/2016, 9:53 PM  [place] MAIN [facility]  SERVICES [address] [place], [place], [address] Phone: [phone]   Fax:  [phone]  Name: Nicole Swanson MRN: [id number] Date of Birth: 11-30-1955

## 2016-01-09 ENCOUNTER — Ambulatory Visit: Payer: 59 | Admitting: Physical Therapy

## 2016-01-09 DIAGNOSIS — M6281 Muscle weakness (generalized): Secondary | ICD-10-CM | POA: Diagnosis not present

## 2016-01-09 DIAGNOSIS — N8189 Other female genital prolapse: Secondary | ICD-10-CM | POA: Diagnosis not present

## 2016-01-09 DIAGNOSIS — R279 Unspecified lack of coordination: Secondary | ICD-10-CM | POA: Diagnosis not present

## 2016-01-09 DIAGNOSIS — R293 Abnormal posture: Secondary | ICD-10-CM

## 2016-01-09 DIAGNOSIS — H169 Unspecified keratitis: Secondary | ICD-10-CM | POA: Diagnosis not present

## 2016-01-09 NOTE — Patient Instructions (Addendum)
IN PUBLIC PLACES ROUTINE  Resisted walking with blue band  Slow and controlled forward and backward =1 rep  10 reps x 3    Seated PNF D2 ext "Drawing a sword" with blue band  10 reps x 2 on each side    Seated PNF: "drawing the seatbelt" Beginner position: archering to sky Hold band over head in L hand, shoulder balde down Movement: pull band w/ R hand across face and down by R pocket    Double heel raises standing     ___________________________ HOTEL: SETTING: perform last week's exercises    ___________________________  Yoga MODIFICATIONS with chair to minimize stand <> floor positions      ___________________________  Lifting suitcase /back pack body mechanics

## 2016-01-10 NOTE — Therapy (Signed)
[place] MAIN [facility] SERVICES [address] [place], [place], [address] Phone: [phone]   Fax:  [phone]  Physical Therapy Treatment  Patient Details  Name: LATAJA NEWLAND MRN: [id number] Date of Birth: 1956-07-31 Referring Provider: Matilde Sprang  Encounter Date: 01/09/2016      PT End of Session - 01/10/16 1610    Visit Number 12   Number of Visits 12   Date for PT Re-Evaluation 02/29/16   PT Start Time 6314   PT Stop Time 1710   PT Time Calculation (min) 65 min   Activity Tolerance Patient tolerated treatment well;No increased pain   Behavior During Therapy [facility] for tasks assessed/performed      Past Medical History  Diagnosis Date  . Aborted cardiac arrest 1994  . DVT (deep vein thrombosis) in pregnancy 1986    had heparin shots followed by coumadin  . Dual implantable cardiac defibrillator     Medtronic initially implanted in 1997  . 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 Irritable bowel syndrome   . PVC's (premature ventricular contractions)   . Ventricular tachycardia ([place])   . Family history of seizures     Question neurological versus cardiac  . Allergy   . Heart murmur     Past Surgical History  Procedure Laterality Date  . Cardiac defibrillator placement  1994    Dual chamber with pacemaker  . Nasal septum surgery  2006    [facility]   . Abdominal hysterectomy    . Carpal tunnel release Right   . Laparoscopic lysis of adhesions    . Appendectomy    . Pacemaker insertion      5 different devices  . Oophorectomy      There were no vitals filed for this visit.      Subjective Assessment - 01/09/16 1602    Subjective Pt reported she tried the pessary fitting and none of them stayed in place due to strong muscles and tissue changes. Pt has no change in her pressure sensation. her GYN MD informed her that her options were to tolerate her prolapse Sx or undergo surgery. pt has not decided what  she would like to do yet. Pt will stil consider pelvic health PT if she does undergo surgery. Pt is willing to continue performing her exercises. Pt states her balance has improved.    Pertinent History Hx; cardiac conditions, Hx of abdominal surgeries,    Patient Stated Goals relief from prolapse, LBP, feet pain and to gain stabilty in core             [facility] PT Assessment - 01/10/16 1602    Ambulation/Gait   Gait Comments good carry over with gait mechanics, increased weight bearing and anterior COM                      [facility] Adult PT Treatment/Exercise - 01/10/16 1602    Posture/Postural Control   Posture Comments no cuing for alignment in all positions   lifting mechanics, yoga w/ chair to minimize prolapse   Neuro Re-ed    Neuro Re-ed Details  see pt instructions                     PT Long Term Goals - 01/10/16 1547    PT LONG TERM GOAL #1   Title Pt will decrease her ODI score from 16% to < 10% in order to return to ADLs.  Time 12   Period Weeks   Status Achieved   PT LONG TERM GOAL #2   Title Pt will decrease her PFDI score from 46% to < 36% in order optimize pelvic floor function for bowel and urination   (11/28/15: PFDI 30% ).   Time 12   Period Weeks   Status Partially Met   PT LONG TERM GOAL #3   Title Pt will demo increased scar mobility over abdomen in order to decrease pain and perform hip flexion without wincing.    Time 12   Period Weeks   Status Achieved   PT LONG TERM GOAL #4   Title Pt will demo 5/5 hip flexion bilaterally in order to walk and perform sit to stand efficiently.    Time 12   Period Weeks   Status Partially Met   PT LONG TERM GOAL #5   Title Pt will demo no lumbopelvic instability with deep core level 1-4 (5 reps ) in order to improve balance and withstand load bearing forces through spine with carrying groceries.   Time 12   Period Weeks   Status Achieved   PT LONG TERM GOAL #6   Title Pt will demo  increased SLS time bilaterally to > 30 sec to minimize risk for falls.    Time 12   Period Weeks   Status Achieved   PT LONG TERM GOAL #7   Title Pt will report no feet pain when getting out of bed and stepping on to the floor in order to transfer safely.   Time 12   Period Weeks   Status Achieved   PT LONG TERM GOAL #8   Title Pt willl demo more caudal position of bladder in standing (PT palpating w/ closure of pelvic floor muscles at a Grade 3)  in order to demo increased fascial tensigrity and pelvic floor strength to minimize worsening of prolapse symptoms.    Time 12   Period Weeks   Status Achieved   PT LONG TERM GOAL  #9   TITLE Pt will demo more pronation on stance phase bilaterally in order to improve balance with walking   Time 12   Period Weeks   Status Achieved               Plan - 01/10/16 1604    Clinical Impression Statement Pt reported she returned from her GYN MD and learned that none of the pessaries were able to stay in due to strength of her muscles and tissue changes. Pt has not decided whether she will continue to tolerate her prolapse Sx or whether she will undergo surgery.Across the past 10 visits, pt has progressed to the point where she no longer feels bothered by her prolapse when sitting but only with walking and standing.  Pt demo'd improved gait mechanics, postural stability and propioception, decreased scar restrictions over her low abdomen, and increased pelvic floor strength.      Rehab Potential Good   PT Frequency 1x / week   PT Duration 12 weeks   PT Treatment/Interventions ADLs/Self Care Home Management;Aquatic Therapy;Biofeedback;Cryotherapy;Electrical Stimulation;Moist Heat;Traction;Patient/family education;Neuromuscular re-education;Balance training;Therapeutic exercise;Therapeutic activities;Stair training;Gait training;Functional mobility training;Manual techniques;Scar mobilization;Passive range of motion;Energy conservation;Taping    Consulted and Agree with Plan of Care Patient      Patient will benefit from skilled therapeutic intervention in order to improve the following deficits and impairments:  Decreased strength, Increased muscle spasms, Improper body mechanics, Difficulty walking, Decreased activity tolerance, Decreased balance, Decreased coordination, Decreased endurance,  Decreased scar mobility, Increased fascial restricitons, Pain, Decreased safety awareness, Decreased range of motion, Decreased mobility, Postural dysfunction  Visit Diagnosis: Abnormal posture  Muscle weakness (generalized)  Unspecified lack of coordination     Problem List Patient Active Problem List   Diagnosis Date Noted  . Urinary frequency 09/16/2015  . Bladder prolapse, female, acquired 09/08/2015  . DDD (degenerative disc disease), lumbar 01/25/2015  . Sacroiliac joint dysfunction of both sides 01/25/2015  . Spinal stenosis, lumbar region, with neurogenic claudication 01/25/2015  . Facet syndrome, lumbar 01/25/2015  . Lumbar radiculopathy 01/25/2015  . Gallstones 12/23/2014  . Polyarthritis 03/16/2014  . Thrombocytopenia ([place]) 09/11/2013  . Other malaise and fatigue 09/10/2013  . Major depressive disorder, single episode 09/10/2013  . Raynaud phenomenon 04/17/2013  . Low back pain 12/05/2012  . Shortness of breath 07/03/2012  . Aborted cardiac arrest   . ICD-Medtronic   . PVC's / ventricular tachycardia   . 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 Irritable bowel syndrome     [name] ,PT, DPT, E-RYT  01/10/2016, 4:11 PM  [place] MAIN [facility] SERVICES [address] [place], [place], [address] Phone: [phone]   Fax:  [phone]  Name: SHELLYE ZANDI MRN: [id number] Date of Birth: 1956/05/14

## 2016-01-31 ENCOUNTER — Encounter: Payer: Self-pay | Admitting: Internal Medicine

## 2016-01-31 ENCOUNTER — Ambulatory Visit (INDEPENDENT_AMBULATORY_CARE_PROVIDER_SITE_OTHER): Payer: 59 | Admitting: Internal Medicine

## 2016-01-31 VITALS — BP 114/76 | HR 76 | Ht 67.0 in | Wt 142.5 lb

## 2016-01-31 DIAGNOSIS — Z95810 Presence of automatic (implantable) cardiac defibrillator: Secondary | ICD-10-CM | POA: Diagnosis not present

## 2016-01-31 DIAGNOSIS — I493 Ventricular premature depolarization: Secondary | ICD-10-CM

## 2016-01-31 DIAGNOSIS — I469 Cardiac arrest, cause unspecified: Secondary | ICD-10-CM

## 2016-01-31 NOTE — Progress Notes (Signed)
Patient Care Team: [name], MD as PCP - General (Internal Medicine)   HPI  Nicole Swanson is a 60 y.o. female Seen in followup for ICD implanted for aborted cardiac arrest. Her care initially had been at [facility]. She is status post dual chamber ICD implantation and generator replacement was recently done by me summer 2013   Echocardiogram 2011 demonstrated normal left ventricular function valve function and dimension sizes   She also has a history of ventricular ectopy for which she has been on sotalol in which Dr. Sharon Seller was going to increase prior to her transferring her care to [facility]   Her husband died 83 .    She denies problems with exercise tolerance at this point; at her last visit we activated/augmented rate response on her device  There is no edema or chest pain. She has stable sob  She has some palpitations but nothing sustained.  Date Cr Mg K  4/15  0.9 2.4    10/15  0.9   4.5     Last laboratories were magnesium  4/15-2.4 and potassium 10/15  -4.5 and 0.9 Cr   Past Medical History  Diagnosis Date  . Aborted cardiac arrest 1994  . DVT (deep vein thrombosis) in pregnancy 1986    had heparin shots followed by coumadin  . Dual implantable cardiac defibrillator     Medtronic initially implanted in 1997  . 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 Irritable bowel syndrome   . PVC's (premature ventricular contractions)   . Ventricular tachycardia ([place])   . Family history of seizures     Question neurological versus cardiac  . Allergy   . Heart murmur     Past Surgical History  Procedure Laterality Date  . Cardiac defibrillator placement  1994    Dual chamber with pacemaker  . Nasal septum surgery  2006    [facility]   . Abdominal hysterectomy    . Carpal tunnel release Right   . Laparoscopic lysis of adhesions    . Appendectomy    . Pacemaker insertion      5 different devices  . Oophorectomy      Current  Outpatient Prescriptions  Medication Sig Dispense Refill  . aspirin 81 MG tablet Take 81 mg by mouth daily.      . Calcium Carb-Cholecalciferol (CALCIUM PLUS VITAMIN D3 PO) Take by mouth daily. Reported on 09/08/2015    . Cranberry (THERACRAN PO) Take 180 mg by mouth 2 (two) times daily. Reported on 09/08/2015    . docusate sodium (COLACE) 250 MG capsule Take 250 mg by mouth 2 (two) times daily.    . fexofenadine (ALLEGRA) 180 MG tablet Take 180 mg by mouth as needed for allergies or rhinitis.    . fluticasone (FLONASE) 50 MCG/ACT nasal spray USE 1 SPRAY IN EACH NOSTRIL DAILY 16 g 5  . Misc Natural Products (OSTEO BI-FLEX TRIPLE STRENGTH PO) Take by mouth 2 (two) times daily.     . montelukast (SINGULAIR) 10 MG tablet TAKE 1 TABLET BY MOUTH AT BEDTIME. 90 tablet 1  . polyethylene glycol-electrolytes (NULYTELY/GOLYTELY) 420 G solution Drink 8 ounces of solution every 8 hours until constipation is relieved 4000 mL 0  . sotalol (BETAPACE) 120 MG tablet TAKE 1 TABLET BY MOUTH 2 TIMES DAILY. 180 tablet 3   Current Facility-Administered Medications  Medication Dose Route Frequency Provider Last Rate Last Dose  . triamcinolone acetonide (KENALOG) 10 MG/ML injection  10 mg  10 mg Other Once Landis Martins, DPM        Allergies  Allergen Reactions  . Sulfa Antibiotics Diarrhea  . Penicillins Rash  . Vancomycin Rash    Review of Systems negative except from HPI and PMH  Physical Exam BP 114/76 mmHg  Pulse 76  Ht 5\' 7"  (1.702 m)  Wt 142 lb 8 oz (64.638 kg)  BMI 22.31 kg/m2 Well developed and well nourished in no acute distress HENT normal E scleral and icterus clear Neck Supple JVP flat; carotids brisk and full Clear to ausculation Device pocket well healed; without hematoma or erythema.  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  The information was reviewed. No changes were made in the programming.       One of the issues that it is suggested by her ECG is whether   ARVC Is the mechanism of her initial cardiac arrest given her ventricular ectopy. repeat signal average ECG was normal  She is tolerating sotalol   Will check surveillance labs

## 2016-01-31 NOTE — Patient Instructions (Signed)
Medication Instructions: - Your physician recommends that you continue on your current medications as directed. Please refer to the Current Medication list given to you today.  Labwork: - Your physician recommends that you have lab work today: BMP/ Magnesium  Procedures/Testing: - none  Follow-Up: - Remote monitoring is used to monitor your Pacemaker of ICD from home. This monitoring reduces the number of office visits required to check your device to one time per year. It allows [place] to keep an eye on the functioning of your device to ensure it is working properly. You are scheduled for a device check from home on 05/01/16 . You may send your transmission at any time that day. If you have a wireless device, the transmission will be sent automatically. After your physician reviews your transmission, you will receive a postcard with your next transmission date.  - Your physician wants you to follow-up in: 6 months with Dr. [name]. You will receive a reminder letter in the mail two months in advance. If you don't receive a letter, please call our office to schedule the follow-up appointment.  Any Additional Special Instructions Will Be Listed Below (If Applicable).     If you need a refill on your cardiac medications before your next appointment, please call your pharmacy.

## 2016-02-01 ENCOUNTER — Other Ambulatory Visit: Payer: Self-pay | Admitting: Urology

## 2016-02-01 DIAGNOSIS — R3915 Urgency of urination: Secondary | ICD-10-CM | POA: Diagnosis not present

## 2016-02-01 DIAGNOSIS — Z0000 Encounter for general adult medical examination without abnormal findings: Secondary | ICD-10-CM | POA: Diagnosis not present

## 2016-02-01 DIAGNOSIS — R32 Unspecified urinary incontinence: Secondary | ICD-10-CM | POA: Diagnosis not present

## 2016-02-01 LAB — BASIC METABOLIC PANEL
BUN/Creatinine Ratio: 12 (ref 12–28)
BUN: 9 mg/dL (ref 8–27)
CALCIUM: 9.7 mg/dL (ref 8.7–10.3)
CHLORIDE: 102 mmol/L (ref 96–106)
CO2: 20 mmol/L (ref 18–29)
Creatinine, Ser: 0.76 mg/dL (ref 0.57–1.00)
GFR calc Af Amer: 97 mL/min/{1.73_m2} (ref >59)
GFR, EST NON AFRICAN AMERICAN: 84 mL/min/{1.73_m2} (ref >59)
GLUCOSE: 87 mg/dL (ref 65–99)
POTASSIUM: 4.9 mmol/L (ref 3.5–5.2)
Sodium: 141 mmol/L (ref 134–144)

## 2016-02-01 LAB — MAGNESIUM: MAGNESIUM: 2.4 mg/dL — AB (ref 1.6–2.3)

## 2016-02-08 ENCOUNTER — Encounter: Payer: Self-pay | Admitting: Urology

## 2016-02-08 ENCOUNTER — Ambulatory Visit (INDEPENDENT_AMBULATORY_CARE_PROVIDER_SITE_OTHER): Payer: 59 | Admitting: Urology

## 2016-02-08 ENCOUNTER — Encounter: Payer: Self-pay | Admitting: Internal Medicine

## 2016-02-08 VITALS — BP 117/73 | HR 46 | Ht 67.0 in | Wt 141.0 lb

## 2016-02-08 DIAGNOSIS — R350 Frequency of micturition: Secondary | ICD-10-CM

## 2016-02-08 LAB — CUP PACEART INCLINIC DEVICE CHECK
Brady Statistic AP VP Percent: 0.02 %
Brady Statistic AS VS Percent: 35.33 %
Brady Statistic RA Percent Paced: 64.64 %
Brady Statistic RV Percent Paced: 0.05 %
HIGH POWER IMPEDANCE MEASURED VALUE: 49 Ohm
HighPow Impedance: 456 Ohm
HighPow Impedance: 72 Ohm
Implantable Lead Implant Date: 19970917
Implantable Lead Location: 753859
Lead Channel Impedance Value: 456 Ohm
Lead Channel Impedance Value: 494 Ohm
Lead Channel Pacing Threshold Pulse Width: 0.4 ms
Lead Channel Sensing Intrinsic Amplitude: 2.875 mV
MDC IDC LEAD IMPLANT DT: 20070119
MDC IDC LEAD LOCATION: 753860
MDC IDC LEAD MODEL: 6942
MDC IDC MSMT BATTERY VOLTAGE: 3.07 V
MDC IDC MSMT LEADCHNL RA PACING THRESHOLD AMPLITUDE: 0.75 V
MDC IDC MSMT LEADCHNL RV PACING THRESHOLD AMPLITUDE: 0.875 V
MDC IDC MSMT LEADCHNL RV PACING THRESHOLD PULSEWIDTH: 0.4 ms
MDC IDC MSMT LEADCHNL RV SENSING INTR AMPL: 11.75 mV
MDC IDC SESS DTM: 20170530134822
MDC IDC SET LEADCHNL RA PACING AMPLITUDE: 2 V
MDC IDC SET LEADCHNL RV PACING AMPLITUDE: 2.5 V
MDC IDC SET LEADCHNL RV PACING PULSEWIDTH: 0.4 ms
MDC IDC SET LEADCHNL RV SENSING SENSITIVITY: 0.45 mV
MDC IDC STAT BRADY AP VS PERCENT: 64.63 %
MDC IDC STAT BRADY AS VP PERCENT: 0.03 %

## 2016-02-08 MED ORDER — SOLIFENACIN SUCCINATE 5 MG PO TABS: 5.0000 mg | ORAL_TABLET | Freq: Every day | ORAL | Status: DC

## 2016-02-08 MED ORDER — MIRABEGRON ER 50 MG PO TB24: 50.0000 mg | ORAL_TABLET | Freq: Every day | ORAL | Status: DC

## 2016-02-08 NOTE — Progress Notes (Signed)
02/08/2016 4:27 PM   LUN BREIT 11-06-1955 [id number]  Referring provider: [name], MD [address] [place], [place] [address]  Chief Complaint  Patient presents with  . Results    UDS results    HPI: The patient is a new patient with mild mixed stress and urge incontinence. She sometimes leaks with coughing sneezing bending lifting. She sometimes has urge incontinence and postvoid dribbling. She denies enuresis. She does not wear pads. She sometimes gets up once a night and voids every 2-3 hours.   During the patient's last visit she had mild hypermobility of the bladder neck and a high grade 2 cystocele  She appeared to have some vague prolapse symptoms noted on her last visit.  The patient never had her urodynamics and chose to see our physical therapy team locally. She thinks it is helping some especially her core muscles. She still has some local vaginal symptoms  Assessment & Plan: The patient has mild prolapse symptoms and a cystocele. I will see her in 4 months. She understands I would need urodynamics before ever considering surgery and I would like to reexamine her since the findings were never that significant on my last pelvic examination. I discussed briefly surgery and success rates today.  The patient on urodynamics emptied officially on uroflow. Her maximum capacity was 440 mL. Her bladder was unstable reaching pressures of 8 cm water and she had moderate leakage. At 400 mL her leak point pressure was 50 cm water with the prolapse reduced. There is no leakage prior to this. She was triggering some instability. Her cough was weak. During voluntary voiding she voided 425 mL with a maximum flow of 13 mL/s. Maximum voiding pressure was 10 cm water. EMG activity noted artifact. Bladder neck dissented 3 cm. Fluoroscopically she had funneling of her bladder neck and she did have arguably a mild to moderate cystocele  Past Medical History  Diagnosis Date    . Aborted cardiac arrest 1994  . DVT (deep vein thrombosis) in pregnancy 1986    had heparin shots followed by coumadin  . Dual implantable cardiac defibrillator     [employer] initially implanted in 1997  . 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 Irritable bowel syndrome   . PVC's (premature ventricular contractions)   . Ventricular tachycardia ([place])   . Family history of seizures     Question neurological versus cardiac  . Allergy   . Heart murmur     Surgical History: Past Surgical History  Procedure Laterality Date  . Cardiac defibrillator placement  1994    Dual chamber with pacemaker  . Nasal septum surgery  2006    [facility]   . Abdominal hysterectomy    . Carpal tunnel release Right   . Laparoscopic lysis of adhesions    . Appendectomy    . Pacemaker insertion      5 different devices  . Oophorectomy      Home Medications:    Medication List       This list is accurate as of: 02/08/16  4:27 PM.  Always use your most recent med list.               aspirin 81 MG tablet  Take 81 mg by mouth daily.     CALCIUM PLUS VITAMIN D3 PO  Take by mouth daily. Reported on 09/08/2015     docusate sodium 250 MG capsule  Commonly known as:  COLACE  Take 250 mg by mouth 2 (two) times daily.     fexofenadine 180 MG tablet  Commonly known as:  ALLEGRA  Take 180 mg by mouth as needed for allergies or rhinitis.     fluticasone 50 MCG/ACT nasal spray  Commonly known as:  FLONASE  USE 1 SPRAY IN EACH NOSTRIL DAILY     mirabegron ER 50 MG Tb24 tablet  Commonly known as:  MYRBETRIQ  Take 1 tablet (50 mg total) by mouth daily.     montelukast 10 MG tablet  Commonly known as:  SINGULAIR  TAKE 1 TABLET BY MOUTH AT BEDTIME.     OSTEO BI-FLEX TRIPLE STRENGTH PO  Take by mouth 2 (two) times daily.     polyethylene glycol-electrolytes 420 g solution  Commonly known as:  NuLYTELY/GoLYTELY  Drink 8 ounces of solution every 8 hours until constipation is  relieved     solifenacin 5 MG tablet  Commonly known as:  VESICARE  Take 1 tablet (5 mg total) by mouth daily.     sotalol 120 MG tablet  Commonly known as:  BETAPACE  TAKE 1 TABLET BY MOUTH 2 TIMES DAILY.     THERACRAN PO  Take 180 mg by mouth 2 (two) times daily. Reported on 09/08/2015        Allergies:  Allergies  Allergen Reactions  . Sulfa Antibiotics Diarrhea  . Penicillins Rash  . Vancomycin Rash    Family History: Family History  Problem Relation Age of Onset  . Heart disease Mother 16    A Fib, CHF  . Stroke Mother   . Heart disease Father   . Stroke Father   . Seizures Brother   . Seizures Daughter     Social History:  reports that she quit smoking about 20 years ago. Her smoking use included Cigarettes. She has never used smokeless tobacco. She reports that she drinks alcohol. She reports that she does not use illicit drugs.  ROS: UROLOGY Frequent Urination?: Yes Hard to postpone urination?: No Burning/pain with urination?: No Get up at night to urinate?: No Leakage of urine?: Yes Urine stream starts and stops?: No Trouble starting stream?: No Do you have to strain to urinate?: No Blood in urine?: No Urinary tract infection?: No Sexually transmitted disease?: No Injury to kidneys or bladder?: No Painful intercourse?: No Weak stream?: Yes Currently pregnant?: No Vaginal bleeding?: No Last menstrual period?: n  Gastrointestinal Nausea?: No Vomiting?: No Indigestion/heartburn?: No Diarrhea?: No Constipation?: No  Constitutional Fever: No Night sweats?: No Weight loss?: No Fatigue?: No  Skin Skin rash/lesions?: No Itching?: No  Eyes Blurred vision?: No Double vision?: No  Ears/Nose/Throat Sore throat?: No Sinus problems?: No  Hematologic/Lymphatic Swollen glands?: No Easy bruising?: No  Cardiovascular Leg swelling?: No Chest pain?: No  Respiratory Cough?: No Shortness of breath?: No  Endocrine Excessive thirst?:  No  Musculoskeletal Back pain?: Yes Joint pain?: Yes  Neurological Headaches?: No Dizziness?: No  Psychologic Depression?: No Anxiety?: No  Physical Exam: BP 117/73 mmHg  Pulse 46  Ht 5\' 7"  (1.702 m)  Wt 141 lb (63.957 kg)  BMI 22.08 kg/m2   Laboratory Data: Lab Results  Component Value Date   WBC 4.1 04/06/2015   WBC 4.1 04/06/2015   HGB 13.2 04/06/2015   HGB 13.2 04/06/2015   HCT 39.8 04/06/2015   HCT 39.5 04/06/2015   MCV 88.1 04/06/2015   MCV 87.6 04/06/2015   PLT 157.0 04/06/2015   PLT 156.0 04/06/2015  Lab Results  Component Value Date   CREATININE 0.76 01/31/2016     Urinalysis    Component Value Date/Time   COLORURINE YELLOW 07/02/2014 0945   APPEARANCEUR Clear 09/16/2015 1149   APPEARANCEUR CLEAR 07/02/2014 0945   LABSPEC >=1.030* 07/02/2014 0945   PHURINE 5.5 07/02/2014 0945   GLUCOSEU Negative 09/16/2015 1149   GLUCOSEU NEGATIVE 07/02/2014 0945   HGBUR NEGATIVE 07/02/2014 0945   BILIRUBINUR Negative 09/16/2015 1149   BILIRUBINUR neg 09/08/2015 1131   BILIRUBINUR NEGATIVE 07/02/2014 0945   KETONESUR NEGATIVE 07/02/2014 0945   PROTEINUR Negative 09/16/2015 1149   PROTEINUR neg 09/08/2015 1131   UROBILINOGEN 0.2 09/08/2015 1131   UROBILINOGEN 0.2 07/02/2014 0945   NITRITE Negative 09/16/2015 1149   NITRITE neg 09/08/2015 1131   NITRITE NEGATIVE 07/02/2014 0945   LEUKOCYTESUR 1+* 09/16/2015 1149   LEUKOCYTESUR Negative 09/08/2015 1131    Pertinent Imaging: None  Assessment & Plan:  When asked the patient says her prolapse or the feeling of something in the vagina is her worst symptoms and sometimes she reduces it. She has had a hysterectomy. She says her second worrisome symptom is her mild incontinence and she has been seen by his goal therapy but did not receive enough benefit  I drew her a picture. She understands it normally a would not treat her mild mixed incontinence surgically. We'll would need to consider this if she ever had  a cystocele repair. She understands the prolapse is separate. I will reassess her on the beta 3 agonists and also trial of Vesicare 5 mg in 8 weeks. She does not reacher treatment goal I will draw her another picture and discuss a cystocele repair of Septra. I will comment on whether or not her bladder incontinence should be addressed as well. She understands that I will reexamine her on that day   There are no diagnoses linked to this encounter.  No Follow-up on file.  Reece Packer, MD  [facility] [address], [place], [place] [address] [phone]

## 2016-03-12 ENCOUNTER — Encounter: Payer: Self-pay | Admitting: Internal Medicine

## 2016-03-12 ENCOUNTER — Telehealth: Payer: Self-pay | Admitting: Internal Medicine

## 2016-03-12 ENCOUNTER — Telehealth: Payer: Self-pay | Admitting: *Deleted

## 2016-03-12 ENCOUNTER — Ambulatory Visit (INDEPENDENT_AMBULATORY_CARE_PROVIDER_SITE_OTHER): Payer: 59 | Admitting: Internal Medicine

## 2016-03-12 VITALS — BP 130/78 | HR 87 | Temp 97.4°F | Resp 14 | Ht 67.0 in | Wt 143.6 lb

## 2016-03-12 DIAGNOSIS — R0789 Other chest pain: Secondary | ICD-10-CM | POA: Diagnosis not present

## 2016-03-12 DIAGNOSIS — R001 Bradycardia, unspecified: Secondary | ICD-10-CM | POA: Diagnosis not present

## 2016-03-12 DIAGNOSIS — T887XXA Unspecified adverse effect of drug or medicament, initial encounter: Secondary | ICD-10-CM | POA: Diagnosis not present

## 2016-03-12 DIAGNOSIS — R350 Frequency of micturition: Secondary | ICD-10-CM | POA: Diagnosis not present

## 2016-03-12 NOTE — Patient Instructions (Addendum)
Stop the myrbetriq and vesicare . They can prolong your QT interval so they should not be taken with sotalol.  Follow up with Dr Alcide Goodness.  If episode recurs,  Call [phone]

## 2016-03-12 NOTE — Telephone Encounter (Signed)
Please advise a place on Dr. Derrel Nip Schedule to place pt with in this week . She has a Hx of heart issues, and feels her heart rate has decreased.

## 2016-03-12 NOTE — Progress Notes (Signed)
Subjective:  Patient ID: Nicole Swanson, female    DOB: Sep 04, 1955  Age: 60 y.o. MRN: [id number]  CC: The primary encounter diagnosis was Bradycardia. Diagnoses of Other chest pain, Urinary frequency, and Non-dose-related adverse reaction to medication, initial encounter were also pertinent to this visit.  HPI CHARRISSE CH presents for evaluation of suspected  medication interaction.   Has been taking sotalol for several years, stable dose,  for history of PVC's/ VT.  She is  s/p AICD/pacer and has a long QT interval.  Was prescribed  Myrbetriq on June 7th by Urology. Marland Kitchenalso taking vesicare   NOtes that a week or so after starting the medication, she began to have episdes of" feeling very bad"  accompanied by feeling flushed, dizzy and nauseated. Occurring at rest.   Her most recent episode occurred today around 11 am and was accompanied by a brief period of chest discomfort which she described as heaviness in the center of her chest.  Lasted 1 minutes and  resolved spontaneously.  She has not had any firing of her ICD  She checked her pulse and room air  O2 saturation on a a portable pulse ox and noted that her pulse dropped to  58 and her oxygen level dropped to  86%  Transiently.  The O2 increased with deep breathing.  No recent travel, no recent surgery, no  shortness of breath .    Outpatient Prescriptions Prior to Visit  Medication Sig Dispense Refill  . aspirin 81 MG tablet Take 81 mg by mouth daily.      . Calcium Carb-Cholecalciferol (CALCIUM PLUS VITAMIN D3 PO) Take by mouth daily. Reported on 09/08/2015    . Cranberry (THERACRAN PO) Take 180 mg by mouth 2 (two) times daily. Reported on 09/08/2015    . docusate sodium (COLACE) 250 MG capsule Take 250 mg by mouth 2 (two) times daily.    . fexofenadine (ALLEGRA) 180 MG tablet Take 180 mg by mouth as needed for allergies or rhinitis.    . fluticasone (FLONASE) 50 MCG/ACT nasal spray USE 1 SPRAY IN EACH NOSTRIL DAILY 16 g 5  . Misc Natural  Products (OSTEO BI-FLEX TRIPLE STRENGTH PO) Take by mouth 2 (two) times daily.     . montelukast (SINGULAIR) 10 MG tablet TAKE 1 TABLET BY MOUTH AT BEDTIME. 90 tablet 1  . polyethylene glycol-electrolytes (NULYTELY/GOLYTELY) 420 G solution Drink 8 ounces of solution every 8 hours until constipation is relieved 4000 mL 0  . sotalol (BETAPACE) 120 MG tablet TAKE 1 TABLET BY MOUTH 2 TIMES DAILY. 180 tablet 3  . mirabegron ER (MYRBETRIQ) 50 MG TB24 tablet Take 1 tablet (50 mg total) by mouth daily. 30 tablet 11  . solifenacin (VESICARE) 5 MG tablet Take 1 tablet (5 mg total) by mouth daily. 30 tablet 11   Facility-Administered Medications Prior to Visit  Medication Dose Route Frequency Provider Last Rate Last Dose  . triamcinolone acetonide (KENALOG) 10 MG/ML injection 10 mg  10 mg Other Once Landis Martins, DPM        Review of Systems;  Patient denies headache, fevers, , unintentional weight loss, skin rash, eye pain, sinus congestion and sinus pain, sore throat, dysphagia,  hemoptysis , cough, dyspnea, wheezing,  palpitations, orthopnea, edema, abdominal pain, nausea, melena, diarrhea, constipation, flank pain, dysuria, hematuria, urinary  Frequency, nocturia, numbness, tingling, seizures,  Focal weakness, Loss of consciousness,  Tremor, insomnia, depression, anxiety, and suicidal ideation.      Objective:  BP 130/78  mmHg  Pulse 87  Temp(Src) 97.4 F (36.3 C) (Oral)  Resp 14  Ht 5\' 7"  (1.702 m)  Wt 143 lb 9.6 oz (65.137 kg)  BMI 22.49 kg/m2  SpO2 96%  BP Readings from Last 3 Encounters:  03/12/16 130/78  02/08/16 117/73  01/31/16 114/76    Wt Readings from Last 3 Encounters:  03/12/16 143 lb 9.6 oz (65.137 kg)  02/08/16 141 lb (63.957 kg)  01/31/16 142 lb 8 oz (64.638 kg)    General appearance: alert, pale, cooperative and appears stated age Neck: no adenopathy, no carotid bruit, supple, symmetrical, trachea midline and thyroid not enlarged, symmetric, no  tenderness/mass/nodules Back: symmetric, no curvature. ROM normal. No CVA tenderness. Lungs: clear to auscultation bilaterally Heart: regular rate and rhythm, S1, S2 normal, no murmur, click, rub or gallop Abdomen: soft, non-tender; bowel sounds normal; no masses,  no organomegaly Pulses: 2+ and symmetric Skin: Skin color, texture, turgor normal. No rashes or lesions. No edema Lymph nodes: Cervical, supraclavicular, and axillary nodes normal.  No results found for: HGBA1C  Lab Results  Component Value Date   CREATININE 0.76 01/31/2016   CREATININE 0.80 04/06/2015   CREATININE 0.83 01/04/2015    Lab Results  Component Value Date   WBC 4.1 04/06/2015   WBC 4.1 04/06/2015   HGB 13.2 04/06/2015   HGB 13.2 04/06/2015   HCT 39.8 04/06/2015   HCT 39.5 04/06/2015   PLT 157.0 04/06/2015   PLT 156.0 04/06/2015   GLUCOSE 87 01/31/2016   CHOL 236* 04/06/2015   TRIG 67.0 04/06/2015   HDL 89.20 04/06/2015   LDLCALC 134* 04/06/2015   ALT 24 04/06/2015   AST 21 04/06/2015   NA 141 01/31/2016   K 4.9 01/31/2016   CL 102 01/31/2016   CREATININE 0.76 01/31/2016   BUN 9 01/31/2016   CO2 20 01/31/2016   TSH 1.13 09/10/2013   INR 0.9 03/18/2012     Assessment & Plan:   Problem List Items Addressed This Visit    Urinary frequency    Urology has prescribed  Vesicare and Myrbetriq, both of which can prolong her QT interval.  Both have been  Discontinued as of today.      Non-dose-related adverse reaction to medication    Suspected, given history of long QT syndrome and recent initiation of bladder medications that can prolong QT. I have reviewed her  EKG done today and there does not appear to be any QT prolongation,  And no ST  Or T wave changes. C/v exam is normal.  Checking lytes and cardiac enzymes given concurrent episode of chest pain this afternoon during episode of bradycardia and malaise.       Other Visit Diagnoses    Bradycardia    -  Primary    Relevant Orders    EKG  12-Lead (Completed)    Comprehensive metabolic panel    Magnesium    Troponin I    Other chest pain        Relevant Orders    CK total and CKMB (cardiac)not at [facility]    Troponin I       I have discontinued Ms. Corsello's mirabegron ER and solifenacin. I am also having her maintain her aspirin, docusate sodium, polyethylene glycol-electrolytes, Misc Natural Products (OSTEO BI-FLEX TRIPLE STRENGTH PO), Cranberry (THERACRAN PO), Calcium Carb-Cholecalciferol (CALCIUM PLUS VITAMIN D3 PO), fexofenadine, sotalol, fluticasone, and montelukast. We will continue to administer triamcinolone acetonide.  No orders of the defined types were placed in this encounter.  Medications Discontinued During This Encounter  Medication Reason  . mirabegron ER (MYRBETRIQ) 50 MG TB24 tablet   . solifenacin (VESICARE) 5 MG tablet   . mirabegron ER (MYRBETRIQ) 50 MG TB24 tablet   . solifenacin (VESICARE) 5 MG tablet     Follow-up: No Follow-up on file.   [name], MD

## 2016-03-12 NOTE — Telephone Encounter (Addendum)
Scheduled for this afternoon at 6:30pm

## 2016-03-12 NOTE — Progress Notes (Signed)
Pre visit review using our clinic review tool, if applicable. No additional management support is needed unless otherwise documented below in the visit note. 

## 2016-03-12 NOTE — Telephone Encounter (Signed)
I have left her a message, she maybe could come in the slots tonight if they aren't full when she calls back? thanks

## 2016-03-13 ENCOUNTER — Telehealth: Payer: Self-pay | Admitting: *Deleted

## 2016-03-13 ENCOUNTER — Encounter: Payer: Self-pay | Admitting: Internal Medicine

## 2016-03-13 DIAGNOSIS — R0789 Other chest pain: Secondary | ICD-10-CM | POA: Diagnosis not present

## 2016-03-13 DIAGNOSIS — R001 Bradycardia, unspecified: Secondary | ICD-10-CM | POA: Diagnosis not present

## 2016-03-13 DIAGNOSIS — T887XXA Unspecified adverse effect of drug or medicament, initial encounter: Secondary | ICD-10-CM | POA: Insufficient documentation

## 2016-03-13 LAB — COMPREHENSIVE METABOLIC PANEL
ALK PHOS: 74 U/L (ref 39–117)
ALT: 13 U/L (ref 0–35)
AST: 17 U/L (ref 0–37)
Albumin: 4.6 g/dL (ref 3.5–5.2)
BILIRUBIN TOTAL: 0.6 mg/dL (ref 0.2–1.2)
BUN: 20 mg/dL (ref 6–23)
CO2: 28 meq/L (ref 19–32)
Calcium: 9.9 mg/dL (ref 8.4–10.5)
Chloride: 104 mEq/L (ref 96–112)
Creatinine, Ser: 0.81 mg/dL (ref 0.40–1.20)
GFR: 76.09 mL/min (ref >60.00)
GLUCOSE: 93 mg/dL (ref 70–99)
Potassium: 4.1 mEq/L (ref 3.5–5.1)
SODIUM: 140 meq/L (ref 135–145)
TOTAL PROTEIN: 7.3 g/dL (ref 6.0–8.3)

## 2016-03-13 LAB — CK TOTAL AND CKMB (NOT AT ARMC): Total CK: 53 U/L (ref 7–177)

## 2016-03-13 LAB — MAGNESIUM: Magnesium: 2.3 mg/dL (ref 1.5–2.5)

## 2016-03-13 LAB — TROPONIN I: Troponin I: 0.01 ng/mL (ref <=0.05)

## 2016-03-13 NOTE — Assessment & Plan Note (Addendum)
Suspected, given history of long QT syndrome and recent initiation of bladder medications that can prolong QT. I have reviewed her  EKG done today and there does not appear to be any QT prolongation,  And no ST  Or T wave changes. C/v exam is normal.  Checking lytes and cardiac enzymes given concurrent episode of chest pain this afternoon during episode of bradycardia and malaise.

## 2016-03-13 NOTE — Assessment & Plan Note (Signed)
Urology has prescribed  Vesicare and Myrbetriq, both of which can prolong her QT interval.  Both have been  Discontinued as of today.

## 2016-03-13 NOTE — Telephone Encounter (Signed)
**  STAT LABS**  TROPONIN: 0.01 CREATININE, CK-<0.7 CREATININE, TOTAL: 53  RESULTS WILL BE FAXED ALSO

## 2016-03-14 NOTE — Telephone Encounter (Signed)
Pt was notified by Dr. Derrel Nip via Deloris Ping

## 2016-03-19 ENCOUNTER — Ambulatory Visit: Payer: 59

## 2016-03-29 ENCOUNTER — Encounter: Payer: Self-pay | Admitting: Internal Medicine

## 2016-04-02 DIAGNOSIS — L538 Other specified erythematous conditions: Secondary | ICD-10-CM | POA: Diagnosis not present

## 2016-04-02 DIAGNOSIS — D692 Other nonthrombocytopenic purpura: Secondary | ICD-10-CM | POA: Diagnosis not present

## 2016-04-02 DIAGNOSIS — D1801 Hemangioma of skin and subcutaneous tissue: Secondary | ICD-10-CM | POA: Diagnosis not present

## 2016-04-02 DIAGNOSIS — L820 Inflamed seborrheic keratosis: Secondary | ICD-10-CM | POA: Diagnosis not present

## 2016-04-02 DIAGNOSIS — L578 Other skin changes due to chronic exposure to nonionizing radiation: Secondary | ICD-10-CM | POA: Diagnosis not present

## 2016-04-04 ENCOUNTER — Ambulatory Visit (INDEPENDENT_AMBULATORY_CARE_PROVIDER_SITE_OTHER): Payer: 59 | Admitting: Urology

## 2016-04-04 VITALS — BP 119/72 | HR 67 | Ht 67.0 in | Wt 142.8 lb

## 2016-04-04 DIAGNOSIS — N3946 Mixed incontinence: Secondary | ICD-10-CM | POA: Diagnosis not present

## 2016-04-04 NOTE — Progress Notes (Signed)
04/04/2016 4:05 PM   Nicole Swanson Jun 04, 1956 [id number]  Referring provider: [name], MD [place], [place] [address]  No chief complaint on file.   HPI: The patient is a new patient with mild mixed stress and urge incontinence. She sometimes leaks with coughing sneezing bending lifting. She sometimes has urge incontinence and postvoid dribbling. She denies enuresis. She does not wear pads. She sometimes gets up once a night and voids every 2-3 hours.   During the patient's last visit she had mild hypermobility of the bladder neck and a high grade 2 cystocele  She appeared to have some vague prolapse symptoms noted on her last visit.   Assessment & Plan: The patient has mild prolapse symptoms and a cystocele. I will see her in 4 months. She understands I would need urodynamics before ever considering surgery and I would like to reexamine her since the findings were never that significant on my last pelvic examination. I discussed briefly surgery and success rates today.  The patient on urodynamics emptied officially on uroflow. Her maximum capacity was 440 mL. Her bladder was unstable reaching pressures of 8 cm water and she had moderate leakage. At 400 mL her leak point pressure was 50 cm water with the prolapse reduced. There is no leakage prior to this. She was triggering some instability. Her cough was weak. During voluntary voiding she voided 425 mL with a maximum flow of 13 mL/s. Maximum voiding pressure was 10 cm water. EMG activity noted artifact. Bladder neck dissented 3 cm. Fluoroscopically she had funneling of her bladder neck and she did have arguably a mild to moderate cystocele   When I examined her in the past she had a high grade 2 cystocele with moderate central defect and no stress incontinence  She is here on a trial of beta 3 agonists Vesicare. The medication reduced her urgency frequency and leaking. He dropped her blood pressure or  heart rate and stopped them. She didn't feel well on him. She currently is almost completely continent. She still feels vaginal prolapse   PMH: Past Medical History:  Diagnosis Date  . Aborted cardiac arrest 1994  . Allergy   . Dual implantable cardiac defibrillator    Medtronic initially implanted in 1997  . DVT (deep vein thrombosis) in pregnancy 1986   had heparin shots followed by coumadin  . Family history of seizures    Question neurological versus cardiac  . Heart murmur   . Irritable bowel syndrome   . 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 PVC's (premature ventricular contractions)   . Ventricular tachycardia [facility])     Surgical History: Past Surgical History:  Procedure Laterality Date  . ABDOMINAL HYSTERECTOMY    . APPENDECTOMY    . CARDIAC DEFIBRILLATOR PLACEMENT  1994   Dual chamber with pacemaker  . CARPAL TUNNEL RELEASE Right   . LAPAROSCOPIC LYSIS OF ADHESIONS    . NASAL SEPTUM SURGERY  [address]   . OOPHORECTOMY    . PACEMAKER INSERTION     5 different devices    Home Medications:    Medication List       Accurate as of 04/04/16  4:05 PM. Always use your most recent med list.          aspirin 81 MG tablet Take 81 mg by mouth daily.   CALCIUM PLUS VITAMIN D3 PO Take by mouth daily. Reported on 09/08/2015  docusate sodium 250 MG capsule Commonly known as:  COLACE Take 250 mg by mouth 2 (two) times daily.   fexofenadine 180 MG tablet Commonly known as:  ALLEGRA Take 180 mg by mouth as needed for allergies or rhinitis.   fluticasone 50 MCG/ACT nasal spray Commonly known as:  FLONASE USE 1 SPRAY IN EACH NOSTRIL DAILY   montelukast 10 MG tablet Commonly known as:  SINGULAIR TAKE 1 TABLET BY MOUTH AT BEDTIME.   OSTEO BI-FLEX TRIPLE STRENGTH PO Take by mouth 2 (two) times daily.   polyethylene glycol-electrolytes 420 g solution Commonly known as:  NuLYTELY/GoLYTELY Drink 8 ounces of solution every 8 hours until  constipation is relieved   sotalol 120 MG tablet Commonly known as:  BETAPACE TAKE 1 TABLET BY MOUTH 2 TIMES DAILY.   THERACRAN PO Take 180 mg by mouth 2 (two) times daily. Reported on 09/08/2015       Allergies:  Allergies  Allergen Reactions  . Sulfa Antibiotics Diarrhea  . Penicillins Rash  . Vancomycin Rash    Family History: Family History  Problem Relation Age of Onset  . Heart disease Mother 70    A Fib, CHF  . Stroke Mother   . Heart disease Father   . Stroke Father   . Seizures Brother   . Seizures Daughter     Social History:  reports that she quit smoking about 20 years ago. Her smoking use included Cigarettes. She has never used smokeless tobacco. She reports that she drinks alcohol. She reports that she does not use drugs.  ROS:                                        Physical Exam: There were no vitals taken for this visit.  Constitutional:  Alert and oriented, No acute distress. HEENT:  AT, moist mucus membranes.  Trachea midline, no masses. Cardiovascular: No clubbing, cyanosis, or edema. Respiratory: Normal respiratory effort, no increased work of breathing. GI: Abdomen is soft, nontender, nondistended, no abdominal masses GU: No CVA tenderness. On repeat pelvic examination she had a moderate grade 2 cystocele with central defect. One could argue with small grade 3 but did not reach to the introitus. There is no question she is loss of vaginal length and her cuff descended for approxi-7 or 8 cm to 5 cm. She had a small grade 1 rectocele. She had hypermobility of the bladder neck and no stress incontinence with the cystocele reduced Skin: No rashes, bruises or suspicious lesions. Lymph: No cervical or inguinal adenopathy. Neurologic: Grossly intact, no focal deficits, moving all 4 extremities. Psychiatric: Normal mood and affect.  Laboratory Data: Lab Results  Component Value Date   WBC 4.1 04/06/2015   WBC 4.1 04/06/2015    HGB 13.2 04/06/2015   HGB 13.2 04/06/2015   HCT 39.8 04/06/2015   HCT 39.5 04/06/2015   MCV 88.1 04/06/2015   MCV 87.6 04/06/2015   PLT 157.0 04/06/2015   PLT 156.0 04/06/2015    Lab Results  Component Value Date   CREATININE 0.81 03/12/2016    No results found for: PSA  No results found for: TESTOSTERONE  No results found for: HGBA1C  Urinalysis    Component Value Date/Time   COLORURINE YELLOW 07/02/2014 0945   APPEARANCEUR Clear 09/16/2015 1149   LABSPEC >=1.030 (A) 07/02/2014 0945   PHURINE 5.5 07/02/2014 0945   GLUCOSEU Negative 09/16/2015 1149  GLUCOSEU NEGATIVE 07/02/2014 0945   HGBUR NEGATIVE 07/02/2014 0945   BILIRUBINUR Negative 09/16/2015 1149   KETONESUR NEGATIVE 07/02/2014 0945   PROTEINUR Negative 09/16/2015 1149   UROBILINOGEN 0.2 09/08/2015 1131   UROBILINOGEN 0.2 07/02/2014 0945   NITRITE Negative 09/16/2015 1149   NITRITE NEGATIVE 07/02/2014 0945   LEUKOCYTESUR 1+ (A) 09/16/2015 1149    Pertinent Imaging: None  Assessment & Plan:  A picture was drawn regarding prolapse. Watchful waiting versus pessary discussed. I discussed a transvaginal vault suspension with cystocele repair and graft with my exact same template that I utilized in [place]. Risk of worsening incontinence discussed. Pros cons and risks etc. again were discussed. Mesh issues were discussed.  I briefly walk through a sling but not convinced this is in the patient's best interest to upon her degree of leakage. Risk of worsening incontinence discussed. Location is surgery discussed  She would like to proceed with surgery. She agreed not to have a sling. She is currently not sexually active. Vaginal shortening was discussed.  There are no diagnoses linked to this encounter.  No Follow-up on file.  Reece Packer, MD  [facility] [address] [place], [place] [address] [phone]

## 2016-04-17 ENCOUNTER — Telehealth: Payer: Self-pay | Admitting: Internal Medicine

## 2016-04-17 ENCOUNTER — Other Ambulatory Visit: Payer: Self-pay | Admitting: Urology

## 2016-04-17 NOTE — Telephone Encounter (Signed)
I left a message for Pam at [facility] to call and clarify what type of anesthesia the patient will require for her procedure.

## 2016-04-17 NOTE — Telephone Encounter (Signed)
Call back from [facility] at [facility]. The patient will require general anesthesia for her procedure. Will have the patient scheduled for a PA/ NP appt (Renee/ Amber) for clearance in September.  Will forward a message to [facility] to please schedule.

## 2016-04-17 NOTE — Telephone Encounter (Signed)
New message       Request for surgical clearance:  1. What type of surgery is being performed? Anterior and vault prolapse repair  When is this surgery scheduled? 06-19-16 2. Are there any medications that need to be held prior to surgery and how long? Hold aspirin/ICD clearance also  3. Name of physician performing surgery? Dr Matilde Sprang  What is your office phone and fax number? Fax [phone]

## 2016-04-24 NOTE — Telephone Encounter (Signed)
-----   Message from Emily Filbert, RN sent at 04/17/2016  3:55 PM EDT ----- Phone message to MT to schedule RU/AS appt for Sept for clearance for urologic surgery.

## 2016-04-24 NOTE — Telephone Encounter (Signed)
Appt scheduled for 05/30/16 with Chanetta Marshall, NP.

## 2016-05-01 ENCOUNTER — Ambulatory Visit (INDEPENDENT_AMBULATORY_CARE_PROVIDER_SITE_OTHER): Payer: 59 | Admitting: *Deleted

## 2016-05-01 DIAGNOSIS — I469 Cardiac arrest, cause unspecified: Secondary | ICD-10-CM

## 2016-05-01 DIAGNOSIS — Z95810 Presence of automatic (implantable) cardiac defibrillator: Secondary | ICD-10-CM | POA: Diagnosis not present

## 2016-05-02 ENCOUNTER — Other Ambulatory Visit: Payer: Self-pay | Admitting: Internal Medicine

## 2016-05-02 NOTE — Progress Notes (Signed)
Remote ICD transmission.   

## 2016-05-03 ENCOUNTER — Encounter: Payer: Self-pay | Admitting: Cardiology

## 2016-05-14 DIAGNOSIS — I493 Ventricular premature depolarization: Secondary | ICD-10-CM | POA: Insufficient documentation

## 2016-05-14 DIAGNOSIS — I469 Cardiac arrest, cause unspecified: Secondary | ICD-10-CM | POA: Insufficient documentation

## 2016-05-14 LAB — CUP PACEART REMOTE DEVICE CHECK
Brady Statistic AP VP Percent: 0.01 %
Brady Statistic AS VP Percent: 0.04 %
Brady Statistic RA Percent Paced: 62.99 %
Date Time Interrogation Session: 20170829042209
HIGH POWER IMPEDANCE MEASURED VALUE: 85 Ohm
HighPow Impedance: 513 Ohm
HighPow Impedance: 56 Ohm
Implantable Lead Location: 753859
Implantable Lead Model: 5076
Lead Channel Impedance Value: 456 Ohm
Lead Channel Impedance Value: 513 Ohm
Lead Channel Pacing Threshold Amplitude: 1.125 V
Lead Channel Sensing Intrinsic Amplitude: 12.5 mV
Lead Channel Sensing Intrinsic Amplitude: 2.25 mV
Lead Channel Setting Pacing Amplitude: 2 V
Lead Channel Setting Pacing Pulse Width: 0.4 ms
MDC IDC LEAD IMPLANT DT: 19970917
MDC IDC LEAD IMPLANT DT: 20070119
MDC IDC LEAD LOCATION: 753860
MDC IDC LEAD MODEL: 6942
MDC IDC MSMT BATTERY VOLTAGE: 3.05 V
MDC IDC MSMT LEADCHNL RA PACING THRESHOLD AMPLITUDE: 0.5 V
MDC IDC MSMT LEADCHNL RA PACING THRESHOLD PULSEWIDTH: 0.4 ms
MDC IDC MSMT LEADCHNL RA SENSING INTR AMPL: 2.25 mV
MDC IDC MSMT LEADCHNL RV PACING THRESHOLD PULSEWIDTH: 0.4 ms
MDC IDC MSMT LEADCHNL RV SENSING INTR AMPL: 12.5 mV
MDC IDC SET LEADCHNL RV PACING AMPLITUDE: 2.5 V
MDC IDC SET LEADCHNL RV SENSING SENSITIVITY: 0.45 mV
MDC IDC STAT BRADY AP VS PERCENT: 62.97 %
MDC IDC STAT BRADY AS VS PERCENT: 36.98 %
MDC IDC STAT BRADY RV PERCENT PACED: 0.05 %

## 2016-05-15 DIAGNOSIS — K625 Hemorrhage of anus and rectum: Secondary | ICD-10-CM | POA: Diagnosis not present

## 2016-05-15 DIAGNOSIS — K581 Irritable bowel syndrome with constipation: Secondary | ICD-10-CM | POA: Diagnosis not present

## 2016-05-18 ENCOUNTER — Encounter: Payer: Self-pay | Admitting: Nurse Practitioner

## 2016-05-30 ENCOUNTER — Ambulatory Visit: Payer: 59 | Admitting: Nurse Practitioner

## 2016-05-30 NOTE — Progress Notes (Signed)
Electrophysiology Office Note Date: 05/31/2016  ID:  [name], [name] April 23, 1956, MRN [id number]  PCP: [name], MD Electrophysiologist: [name]  CC: surgical clearance  Nicole Swanson is a 60 y.o. female seen today for Dr [name].  She presents today for surgical clearance for anterior and posterior vault repair with urology.  Since last being seen in our clinic, the patient reports doing reasonably well.  She has chronic shortness of breath with exertion as well as intermittent chest tightness that is not necessarily related to exertion.  She denies palpitations, PND, orthopnea, nausea, vomiting, dizziness, syncope, edema, weight gain, or early satiety.  She has not had ICD shocks.   Device History: MDT dual chamber ICD implanted 1997 for aborted cardiac arrest; gen change 2007; gen change 2013 History of appropriate therapy: Yes History of AAD therapy: Yes - sotalol   Past Medical History:  Diagnosis Date  . Aborted cardiac arrest 1994  . Allergy   . Dual implantable cardiac defibrillator    Medtronic initially implanted in 1997  . DVT (deep vein thrombosis) in pregnancy 1986   had heparin shots followed by coumadin  . Family history of seizures    Question neurological versus cardiac  . Heart murmur   . Irritable bowel syndrome   . 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 PVC's (premature ventricular contractions)   . Ventricular tachycardia [facility])    Past Surgical History:  Procedure Laterality Date  . ABDOMINAL HYSTERECTOMY    . APPENDECTOMY    . CARDIAC DEFIBRILLATOR PLACEMENT  1994   Dual chamber with pacemaker  . CARPAL TUNNEL RELEASE Right   . LAPAROSCOPIC LYSIS OF ADHESIONS    . NASAL SEPTUM SURGERY  [address]   . OOPHORECTOMY    . PACEMAKER INSERTION     5 different devices    Current Outpatient Prescriptions  Medication Sig Dispense Refill  . aspirin 81 MG tablet Take 81 mg by mouth daily.      . Calcium  Carb-Cholecalciferol (CALCIUM PLUS VITAMIN D3 PO) Take 1 tablet by mouth daily. Reported on 09/08/2015    . dexlansoprazole (DEXILANT) 60 MG capsule Take 60 mg by mouth daily as needed (heartburn).    . docusate sodium (COLACE) 250 MG capsule Take 250 mg by mouth 2 (two) times daily.    . fexofenadine (ALLEGRA) 180 MG tablet Take 180 mg by mouth daily as needed for allergies or rhinitis.     . fluticasone (FLONASE) 50 MCG/ACT nasal spray USE 1 SPRAY IN EACH NOSTRIL DAILY 16 g 5  . montelukast (SINGULAIR) 10 MG tablet TAKE 1 TABLET BY MOUTH AT BEDTIME. 90 tablet 1  . sotalol (BETAPACE) 120 MG tablet TAKE 1 TABLET BY MOUTH 2 TIMES DAILY. 180 tablet 2   Current Facility-Administered Medications  Medication Dose Route Frequency Provider Last Rate Last Dose  . triamcinolone acetonide (KENALOG) 10 MG/ML injection 10 mg  10 mg Other Once Landis Martins, DPM        Allergies:   Vancomycin; Sulfa antibiotics; and Penicillins   Social History: Social History   Social History  . Marital status: Widowed    Spouse name: N/A  . Number of children: N/A  . Years of education: N/A   Occupational History  . Not on file.   Social History Main Topics  . Smoking status: Former Smoker    Types: Cigarettes    Quit date: 06/04/1995  . Smokeless tobacco: Never Used  .  Alcohol use 0.0 oz/week     Comment: occasional  . Drug use: No  . Sexual activity: Not on file   Other Topics Concern  . Not on file   Social History Narrative  . No narrative on file    Family History: Family History  Problem Relation Age of Onset  . Heart disease Mother 55    A Fib, CHF  . Stroke Mother   . Heart disease Father   . Stroke Father   . Seizures Brother   . Seizures Daughter     Review of Systems: All other systems reviewed and are otherwise negative except as noted above.   Physical Exam: VS:  BP 108/68   Pulse 86   Ht 5\' 7"  (1.702 m)   Wt 144 lb (65.3 kg)   SpO2 99%   BMI 22.55 kg/m  , BMI Body  mass index is 22.55 kg/m.  GEN- The patient is well appearing, alert and oriented x 3 today.   HEENT: normocephalic, atraumatic; sclera clear, conjunctiva pink; hearing intact; oropharynx clear; neck supple  Lungs- Clear to ausculation bilaterally, normal work of breathing.  No wheezes, rales, rhonchi Heart- Regular rate and rhythm, no murmurs, rubs or gallops  GI- soft, non-tender, non-distended, bowel sounds present  Extremities- no clubbing, cyanosis, or edema; DP/PT/radial pulses 2+ bilaterally MS- no significant deformity or atrophy Skin- warm and dry, no rash or lesion; ICD pocket well healed Psych- euthymic mood, full affect Neuro- strength and sensation are intact  ICD interrogation- reviewed in detail today,  See PACEART report  EKG:  EKG is ordered today. The ekg ordered today shows atrial pacing with intrinsic ventricular conduction, QTc 449msec  Recent Labs: 03/12/2016: ALT 13; BUN 20; Creatinine, Ser 0.81; Magnesium 2.3; Potassium 4.1; Sodium 140   Wt Readings from Last 3 Encounters:  05/31/16 144 lb (65.3 kg)  04/04/16 142 lb 12.8 oz (64.8 kg)  03/12/16 143 lb 9.6 oz (65.1 kg)     Other studies Reviewed: Additional studies/ records that were reviewed today include: Dr Olin Pia office notes  Assessment and Plan:  1.  Aborted cardiac arrest No recent ventricular arrhythmias Continue Sotalol. QTc stable Update BMET, Mg with next visit with Dr [name]  Normal ICD function See [name] Art report No changes today  2.  Surgical clearance She has intermittent chest pain that is atypical for cardiac pain. With surgery requiring general anesthesia and no recent eval of ischemia, will update echo and stress myoview If echo and myoview stable, she is at acceptable cardiac risk for procedure No need for ICD reprogramming during surgery as incision is below umbilicus. Would continue Sotalol perioperatively. We are available as needed.  Ok to hold ASA as needed per surgeon's  discretion.    Current medicines are reviewed at length with the patient today.   The patient does not have concerns regarding her medicines.  The following changes were made today:  none  Labs/ tests ordered today include: none Orders Placed This Encounter  Procedures  . Myocardial Perfusion Imaging  . EKG 12-Lead  . ECHOCARDIOGRAM COMPLETE     Disposition:   Follow up with Dr [name] as scheduled     Signed, Chanetta Marshall, NP 05/31/2016 10:05 AM  [place] [address] [phone] (office) [phone] (fax)

## 2016-05-31 ENCOUNTER — Encounter: Payer: Self-pay | Admitting: Nurse Practitioner

## 2016-05-31 ENCOUNTER — Ambulatory Visit (INDEPENDENT_AMBULATORY_CARE_PROVIDER_SITE_OTHER): Payer: 59 | Admitting: Nurse Practitioner

## 2016-05-31 VITALS — BP 108/68 | HR 86 | Ht 67.0 in | Wt 144.0 lb

## 2016-05-31 DIAGNOSIS — Z01810 Encounter for preprocedural cardiovascular examination: Secondary | ICD-10-CM | POA: Diagnosis not present

## 2016-05-31 DIAGNOSIS — I469 Cardiac arrest, cause unspecified: Secondary | ICD-10-CM | POA: Diagnosis not present

## 2016-05-31 NOTE — Patient Instructions (Addendum)
Medication Instructions:  Your physician recommends that you continue on your current medications as directed. Please refer to the Current Medication list given to you today.   Labwork: None ordered   Testing/Procedures:  Your physician has requested that you have en exercise stress myoview. For further information please visit [email or web address]. Please follow instruction sheet, as given.   Your physician has requested that you have an echocardiogram. Echocardiography is a painless test that uses sound waves to create images of your heart. It provides your doctor with information about the size and shape of your heart and how well your heart's chambers and valves are working. This procedure takes approximately one hour. There are no restrictions for this procedure.    Follow-Up: Your physician recommends that you schedule a follow-up appointment as scheduled   Any Other Special Instructions Will Be Listed Below (If Applicable).     If you need a refill on your cardiac medications before your next appointment, please call your pharmacy.

## 2016-06-01 ENCOUNTER — Encounter (HOSPITAL_COMMUNITY): Payer: Self-pay

## 2016-06-01 DIAGNOSIS — Z01810 Encounter for preprocedural cardiovascular examination: Secondary | ICD-10-CM | POA: Diagnosis not present

## 2016-06-01 DIAGNOSIS — Z01818 Encounter for other preprocedural examination: Secondary | ICD-10-CM | POA: Diagnosis not present

## 2016-06-01 DIAGNOSIS — Z01812 Encounter for preprocedural laboratory examination: Secondary | ICD-10-CM | POA: Diagnosis not present

## 2016-06-01 LAB — BASIC METABOLIC PANEL
ANION GAP: 7 (ref 5–15)
BUN: 24 mg/dL — ABNORMAL HIGH (ref 6–20)
CHLORIDE: 104 mmol/L (ref 101–111)
CO2: 28 mmol/L (ref 22–32)
CREATININE: 0.75 mg/dL (ref 0.44–1.00)
Calcium: 9.9 mg/dL (ref 8.9–10.3)
GFR calc non Af Amer: >60 mL/min (ref >60)
Glucose, Bld: 100 mg/dL — ABNORMAL HIGH (ref 65–99)
Potassium: 4.2 mmol/L (ref 3.5–5.1)
SODIUM: 139 mmol/L (ref 135–145)

## 2016-06-01 LAB — CBC
HEMATOCRIT: 40.3 % (ref 36.0–46.0)
HEMOGLOBIN: 12.9 g/dL (ref 12.0–15.0)
MCH: 28.8 pg (ref 26.0–34.0)
MCHC: 32 g/dL (ref 30.0–36.0)
MCV: 90 fL (ref 78.0–100.0)
Platelets: 163 10*3/uL (ref 150–400)
RBC: 4.48 MIL/uL (ref 3.87–5.11)
RDW: 13 % (ref 11.5–15.5)
WBC: 4.7 10*3/uL (ref 4.0–10.5)

## 2016-06-01 NOTE — Progress Notes (Signed)
BMP results done 06/01/16 faxed via EPIC to Dr Matilde Sprang.

## 2016-06-01 NOTE — Patient Instructions (Signed)
Nicole Swanson  06/01/2016   Your procedure is scheduled on: 06/19/16   Report to [facility] Main  Entrance take [place]  elevators to 3rd floor to  [place] at    [place] AM.  Call this number if you have problems the morning of surgery [phone]   Remember: ONLY 1 PERSON MAY GO WITH YOU TO SHORT STAY TO GET  READY MORNING OF [place].  Do not eat food or drink liquids :After Midnight.     Take these medicines the morning of surgery with A SIP OF WATER: Dexilant if needed, allegra if needed, Flonase, Sotalol ( Betapace)                                You may not have any metal on your body including hair pins and              piercings  Do not wear jewelry, make-up, lotions, powders or perfumes, deodorant             Do not wear nail polish.  Do not shave  48 hours prior to surgery.           Do not bring valuables to the hospital. [place].  Contacts, dentures or bridgework may not be worn into surgery.  Leave suitcase in the car. After surgery it may be brought to your room.       Special Instructions: N/A              Please read over the following fact sheets you were given: _____________________________________________________________________             [facility] - Preparing for Surgery Before surgery, you can play an important role.  Because skin is not sterile, your skin needs to be as free of germs as possible.  You can reduce the number of germs on your skin by washing with CHG (chlorahexidine gluconate) soap before surgery.  CHG is an antiseptic cleaner which kills germs and bonds with the skin to continue killing germs even after washing. Please DO NOT use if you have an allergy to CHG or antibacterial soaps.  If your skin becomes reddened/irritated stop using the CHG and inform your nurse when you arrive at Short Stay. Do not shave (including legs and underarms) for at least 48  hours prior to the first CHG shower.  You may shave your face/neck. Please follow these instructions carefully:  1.  Shower with CHG Soap the night before surgery and the  morning of Surgery.  2.  If you choose to wash your hair, wash your hair first as usual with your  normal  shampoo.  3.  After you shampoo, rinse your hair and body thoroughly to remove the  shampoo.                           4.  Use CHG as you would any other liquid soap.  You can apply chg directly  to the skin and wash                       Gently with a scrungie or clean washcloth.  5.  Apply the CHG Soap to your body ONLY FROM THE NECK DOWN.   Do not use on face/ open                           Wound or open sores. Avoid contact with eyes, ears mouth and genitals (private parts).                       Wash face,  Genitals (private parts) with your normal soap.             6.  Wash thoroughly, paying special attention to the area where your surgery  will be performed.  7.  Thoroughly rinse your body with warm water from the neck down.  8.  DO NOT shower/wash with your normal soap after using and rinsing off  the CHG Soap.                9.  Pat yourself dry with a clean towel.            10.  Wear clean pajamas.            11.  Place clean sheets on your bed the night of your first shower and do not  sleep with pets. Day of Surgery : Do not apply any lotions/deodorants the morning of surgery.  Please wear clean clothes to the hospital/surgery center.  FAILURE TO FOLLOW THESE INSTRUCTIONS MAY RESULT IN THE CANCELLATION OF YOUR SURGERY PATIENT SIGNATURE_________________________________  NURSE SIGNATURE__________________________________  ________________________________________________________________________

## 2016-06-01 NOTE — Progress Notes (Signed)
EKG-03/12/16- EPIC and 05/31/16- EPIC  Last device check- 05/01/2016 EPIC  05/31/2016- LOV with Cardiology for surgical clearance inEPIC.

## 2016-06-04 ENCOUNTER — Ambulatory Visit (HOSPITAL_BASED_OUTPATIENT_CLINIC_OR_DEPARTMENT_OTHER): Payer: 59

## 2016-06-04 ENCOUNTER — Other Ambulatory Visit: Payer: Self-pay

## 2016-06-04 ENCOUNTER — Telehealth (HOSPITAL_COMMUNITY): Payer: Self-pay | Admitting: *Deleted

## 2016-06-04 DIAGNOSIS — Z01812 Encounter for preprocedural laboratory examination: Secondary | ICD-10-CM | POA: Diagnosis not present

## 2016-06-04 DIAGNOSIS — Z01810 Encounter for preprocedural cardiovascular examination: Secondary | ICD-10-CM | POA: Diagnosis not present

## 2016-06-04 DIAGNOSIS — Z01818 Encounter for other preprocedural examination: Secondary | ICD-10-CM | POA: Diagnosis not present

## 2016-06-04 DIAGNOSIS — I071 Rheumatic tricuspid insufficiency: Secondary | ICD-10-CM

## 2016-06-04 DIAGNOSIS — I340 Nonrheumatic mitral (valve) insufficiency: Secondary | ICD-10-CM

## 2016-06-04 NOTE — Telephone Encounter (Signed)
Patient given detailed instructions per Myocardial Perfusion Study Information Sheet for the test on 06/07/16. Patient notified to arrive 15 minutes early and that it is imperative to arrive on time for appointment to keep from having the test rescheduled.  If you need to cancel or reschedule your appointment, please call the office within 24 hours of your appointment. Failure to do so may result in a cancellation of your appointment, and a $50 no show fee. Patient verbalized understanding. Kirstie Peri

## 2016-06-07 ENCOUNTER — Encounter (HOSPITAL_COMMUNITY)
Admission: RE | Admit: 2016-06-07 | Discharge: 2016-06-07 | Disposition: A | Discharge status: Home / Self Care | Payer: 59 | Source: Ambulatory Visit | Attending: Urology | Admitting: Urology

## 2016-06-07 ENCOUNTER — Ambulatory Visit (HOSPITAL_BASED_OUTPATIENT_CLINIC_OR_DEPARTMENT_OTHER): Payer: 59

## 2016-06-07 DIAGNOSIS — Z01812 Encounter for preprocedural laboratory examination: Secondary | ICD-10-CM | POA: Diagnosis not present

## 2016-06-07 DIAGNOSIS — Z01818 Encounter for other preprocedural examination: Secondary | ICD-10-CM | POA: Diagnosis not present

## 2016-06-07 DIAGNOSIS — Z01810 Encounter for preprocedural cardiovascular examination: Secondary | ICD-10-CM | POA: Insufficient documentation

## 2016-06-07 HISTORY — DX: Reserved for inherently not codable concepts without codable children: IMO0001

## 2016-06-07 HISTORY — DX: Nausea with vomiting, unspecified: R11.2

## 2016-06-07 HISTORY — DX: Presence of cardiac pacemaker: Z95.0

## 2016-06-07 HISTORY — DX: Unspecified asthma, uncomplicated: J45.909

## 2016-06-07 HISTORY — DX: Other specified postprocedural states: Z98.890

## 2016-06-07 HISTORY — DX: Presence of automatic (implantable) cardiac defibrillator: Z95.810

## 2016-06-07 HISTORY — DX: Angina pectoris, unspecified: I20.9

## 2016-06-07 HISTORY — DX: Unspecified osteoarthritis, unspecified site: M19.90

## 2016-06-07 HISTORY — DX: Raynaud's syndrome without gangrene: I73.00

## 2016-06-07 LAB — MYOCARDIAL PERFUSION IMAGING
CHL CUP NUCLEAR SDS: 2
CHL CUP RESTING HR STRESS: 74 {beats}/min
LHR: 0.25
LV dias vol: 64 mL (ref 46–106)
LVSYSVOL: 22 mL
Peak HR: 81 {beats}/min
SRS: 2
SSS: 4
TID: 1.13

## 2016-06-07 MED ORDER — TECHNETIUM TC 99M TETROFOSMIN IV KIT
10.2000 | PACK | Freq: Once | INTRAVENOUS | Status: AC | PRN
  Administered 2016-06-07: 10 via INTRAVENOUS
  Filled 2016-06-07: qty 10

## 2016-06-07 MED ORDER — REGADENOSON 0.4 MG/5ML IV SOLN
0.4000 mg | Freq: Once | INTRAVENOUS | Status: AC
  Administered 2016-06-07: 0.4 mg via INTRAVENOUS

## 2016-06-07 MED ORDER — TECHNETIUM TC 99M TETROFOSMIN IV KIT
32.7000 | PACK | Freq: Once | INTRAVENOUS | Status: AC | PRN
  Administered 2016-06-07: 32.7 via INTRAVENOUS
  Filled 2016-06-07: qty 33

## 2016-06-07 NOTE — Progress Notes (Signed)
ECHO done 06/04/16- EPIC  Stress Test done 06/07/16- EPIC

## 2016-06-08 ENCOUNTER — Telehealth: Payer: Self-pay | Admitting: *Deleted

## 2016-06-08 NOTE — Telephone Encounter (Signed)
LMOVM TO CALL BACK  

## 2016-06-08 NOTE — Telephone Encounter (Signed)
-----   Message from Patsey Berthold, NP sent at 06/07/2016  2:38 PM EDT ----- Echo and myoview both low risk. Ok to proceed with surgery.  Please let patient and surgeon know.

## 2016-06-11 ENCOUNTER — Telehealth: Payer: Self-pay | Admitting: Nurse Practitioner

## 2016-06-11 ENCOUNTER — Telehealth: Payer: Self-pay | Admitting: *Deleted

## 2016-06-11 NOTE — Telephone Encounter (Signed)
-----   Message from Patsey Berthold, NP sent at 06/07/2016  2:38 PM EDT ----- See Brantley Fling note

## 2016-06-11 NOTE — Telephone Encounter (Signed)
Follow Up:; ° ° °Returning your call. °

## 2016-06-11 NOTE — Telephone Encounter (Signed)
LMOVM TO CALL BACK  

## 2016-06-12 NOTE — Telephone Encounter (Signed)
PT AWARE OF RESULTS 

## 2016-06-14 ENCOUNTER — Telehealth: Payer: Self-pay | Admitting: Internal Medicine

## 2016-06-14 ENCOUNTER — Telehealth: Payer: Self-pay | Admitting: Cardiology

## 2016-06-14 NOTE — Telephone Encounter (Signed)
Pt called and stated that her ICD is alarming. Pt is not near her home monitor. She is in [place] at her son's wedding. Instructed pt to go to nearest ER and have device interrogated. Pt verbalized understanding.

## 2016-06-14 NOTE — Telephone Encounter (Signed)
error 

## 2016-06-14 NOTE — Telephone Encounter (Signed)
Spoke w/ pt and she informed me that she found the magnet that was causing her device to alarm. I informed pt that she could go ahead and enjoy the rest of her son's wedding. She verbalized understanding.

## 2016-06-18 ENCOUNTER — Other Ambulatory Visit: Payer: Self-pay | Admitting: Urology

## 2016-06-18 NOTE — Anesthesia Preprocedure Evaluation (Addendum)
Anesthesia Evaluation  Patient identified by MRN, date of birth, ID band Patient awake    Reviewed: Allergy & Precautions, NPO status , Patient's Chart, lab work & pertinent test results  History of Anesthesia Complications (+) PONV and history of anesthetic complications  Airway Mallampati: II  TM Distance: >3 FB Neck ROM: Full    Dental  (+) Teeth Intact, Dental Advisory Given   Pulmonary asthma , former smoker,    Pulmonary exam normal        Cardiovascular + angina + Peripheral Vascular Disease  Normal cardiovascular exam+ pacemaker + Cardiac Defibrillator    The left ventricular ejection fraction is normal (55-65%).  There was no ST segment deviation noted during stress.  The study is normal.  This is a low risk study.     Neuro/Psych PSYCHIATRIC DISORDERS Depression  Neuromuscular disease    GI/Hepatic negative GI ROS, Neg liver ROS,   Endo/Other  negative endocrine ROS  Renal/GU      Musculoskeletal   Abdominal   Peds  Hematology negative hematology ROS (+)   Anesthesia Other Findings   Reproductive/Obstetrics                           Anesthesia Physical Anesthesia Plan  ASA: III  Anesthesia Plan: General   Post-op Pain Management:    Induction: Intravenous  Airway Management Planned: Oral ETT and LMA  Additional Equipment:   Intra-op Plan:   Post-operative Plan: Extubation in OR  Informed Consent: I have reviewed the patients History and Physical, chart, labs and discussed the procedure including the risks, benefits and alternatives for the proposed anesthesia with the patient or authorized representative who has indicated his/her understanding and acceptance.   Dental advisory given  Plan Discussed with: CRNA and Anesthesiologist  Anesthesia Plan Comments:        Anesthesia Quick Evaluation

## 2016-06-18 NOTE — H&P (Signed)
HPI: The patient is a new patient with mild mixed stress and urge incontinence. She sometimes leaks with coughing sneezing bending lifting. She sometimes has urge incontinence and postvoid dribbling. She denies enuresis. She does not wear pads. She sometimes gets up once a night and voids every 2-3 hours.   During the patient's last visit she had mild hypermobility of the bladder neck and a high grade 2 cystocele  She appeared to have some vague prolapse symptoms noted on her last visit.   Assessment & Plan: The patient has mild prolapse symptoms and a cystocele. I will see her in 4 months. She understands I would need urodynamics before ever considering surgery and I would like to reexamine her since the findings were never that significant on my last pelvic examination. I discussed briefly surgery and success rates today.  The patient on urodynamics emptied officially on uroflow. Her maximum capacity was 440 mL. Her bladder was unstable reaching pressures of 8 cm water and she had moderate leakage. At 400 mL her leak point pressure was 50 cm water with the prolapse reduced. There is no leakage prior to this. She was triggering some instability. Her cough was weak. During voluntary voiding she voided 425 mL with a maximum flow of 13 mL/s. Maximum voiding pressure was 10 cm water. EMG activity noted artifact. Bladder neck dissented 3 cm. Fluoroscopically she had funneling of her bladder neck and she did have arguably a mild to moderate cystocele   When I examined her in the past she had a high grade 2 cystocele with moderate central defect and no stress incontinence  She is here on a trial of beta 3 agonists Vesicare. The medication reduced her urgency frequency and leaking. He dropped her blood pressure or heart rate and stopped them. She didn't feel well on him. She currently is almost completely continent. She still feels vaginal prolapse   PMH:     Past Medical History:  Diagnosis  Date  . Aborted cardiac arrest 1994  . Allergy   . Dual implantable cardiac defibrillator    [employer] initially implanted in 1997  . DVT (deep vein thrombosis) in pregnancy 1986   had heparin shots followed by coumadin  . Family history of seizures    Question neurological versus cardiac  . Heart murmur   . Irritable bowel syndrome   . 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 PVC's (premature ventricular contractions)   . Ventricular tachycardia [facility])     Surgical History:      Past Surgical History:  Procedure Laterality Date  . ABDOMINAL HYSTERECTOMY    . APPENDECTOMY    . CARDIAC DEFIBRILLATOR PLACEMENT  1994   Dual chamber with pacemaker  . CARPAL TUNNEL RELEASE Right   . LAPAROSCOPIC LYSIS OF ADHESIONS    . NASAL SEPTUM SURGERY  [address]   . OOPHORECTOMY    . PACEMAKER INSERTION     5 different devices    Home Medications:        Medication List           Accurate as of 04/04/16  4:05 PM. Always use your most recent med list.           aspirin 81 MG tablet Take 81 mg by mouth daily.  CALCIUM PLUS VITAMIN D3 PO Take by mouth daily. Reported on 09/08/2015  docusate sodium 250 MG capsule Commonly known as:  COLACE Take 250 mg by mouth 2 (two) times  daily.  fexofenadine 180 MG tablet Commonly known as:  ALLEGRA Take 180 mg by mouth as needed for allergies or rhinitis.  fluticasone 50 MCG/ACT nasal spray Commonly known as:  FLONASE USE 1 SPRAY IN EACH NOSTRIL DAILY  montelukast 10 MG tablet Commonly known as:  SINGULAIR TAKE 1 TABLET BY MOUTH AT BEDTIME.  OSTEO BI-FLEX TRIPLE STRENGTH PO Take by mouth 2 (two) times daily.  polyethylene glycol-electrolytes 420 g solution Commonly known as:  NuLYTELY/GoLYTELY Drink 8 ounces of solution every 8 hours until constipation is relieved  sotalol 120 MG tablet Commonly known as:  BETAPACE TAKE 1 TABLET BY MOUTH 2 TIMES DAILY.  THERACRAN PO Take  180 mg by mouth 2 (two) times daily. Reported on 09/08/2015      Allergies:      Allergies  Allergen Reactions  . Sulfa Antibiotics Diarrhea  . Penicillins Rash  . Vancomycin Rash    Family History:       Family History  Problem Relation Age of Onset  . Heart disease Mother 57    A Fib, CHF  . Stroke Mother   . Heart disease Father   . Stroke Father   . Seizures Brother   . Seizures Daughter     Social History:  reports that she quit smoking about 20 years ago. Her smoking use included Cigarettes. She has never used smokeless tobacco. She reports that she drinks alcohol. She reports that she does not use drugs.  ROS: Rest of ROS is negative                         Physical Exam: There were no vitals taken for this visit.  Constitutional:  Alert and oriented, No acute distress. HEENT: [place] AT, moist mucus membranes.  Trachea midline, no masses. Cardiovascular: No clubbing, cyanosis, or edema. Respiratory: Normal respiratory effort, no increased work of breathing. GI: Abdomen is soft, nontender, nondistended, no abdominal masses GU: No CVA tenderness. On repeat pelvic examination she had a moderate grade 2 cystocele with central defect. One could argue with small grade 3 but did not reach to the introitus. There is no question she is loss of vaginal length and her cuff descended for approxi-7 or 8 cm to 5 cm. She had a small grade 1 rectocele. She had hypermobility of the bladder neck and no stress incontinence with the cystocele reduced Skin: No rashes, bruises or suspicious lesions. Lymph: No cervical or inguinal adenopathy. Neurologic: Grossly intact, no focal deficits, moving all 4 extremities. Psychiatric: Normal mood and affect.  Laboratory Data: RecentLabs  Lab Results  Component Value Date   WBC 4.1 04/06/2015   WBC 4.1 04/06/2015   HGB 13.2 04/06/2015   HGB 13.2 04/06/2015   HCT 39.8 04/06/2015   HCT 39.5  04/06/2015   MCV 88.1 04/06/2015   MCV 87.6 04/06/2015   PLT 157.0 04/06/2015   PLT 156.0 04/06/2015      RecentLabs       Lab Results  Component Value Date   CREATININE 0.81 03/12/2016      RecentLabs  No results found for: PSA    RecentLabs  No results found for: TESTOSTERONE    RecentLabs  No results found for: HGBA1C    Urinalysis Labs(Brief)     Component Value Date/Time   COLORURINE YELLOW 07/02/2014 0945   APPEARANCEUR Clear 09/16/2015 1149   LABSPEC >=1.030 (A) 07/02/2014 0945   PHURINE 5.5 07/02/2014 0945   GLUCOSEU Negative 09/16/2015  [place] 07/02/2014 0945   HGBUR NEGATIVE 07/02/2014 0945   BILIRUBINUR Negative 09/16/2015 1149   KETONESUR NEGATIVE 07/02/2014 0945   PROTEINUR Negative 09/16/2015 1149   UROBILINOGEN 0.2 09/08/2015 1131   UROBILINOGEN 0.2 07/02/2014 0945   NITRITE Negative 09/16/2015 1149   NITRITE NEGATIVE 07/02/2014 0945   LEUKOCYTESUR 1+ (A) 09/16/2015 1149      Pertinent Imaging: None  Assessment & Plan:  A picture was drawn regarding prolapse. Watchful waiting versus pessary discussed. I discussed a transvaginal vault suspension with cystocele repair and graft with my exact same template that I utilized in [place]. Risk of worsening incontinence discussed. Pros cons and risks etc. again were discussed. Mesh issues were discussed.  I briefly walk through a sling but not convinced this is in the patient's best interest to upon her degree of leakage. Risk of worsening incontinence discussed. Location is surgery discussed  She would like to proceed with surgery. She agreed not to have a sling. She is currently not sexually active. Vaginal shortening was discussed.  There are no diagnoses linked to this encounter.  After a thorough review of the management options for the patient's condition the patient  elected to proceed with surgical therapy as noted above. We have  discussed the potential benefits and risks of the procedure, side effects of the proposed treatment, the likelihood of the patient achieving the goals of the procedure, and any potential problems that might occur during the procedure or recuperation. Informed consent has been obtained.  No Follow-up on file.  Shatyra Becka A, MD

## 2016-06-19 ENCOUNTER — Observation Stay (HOSPITAL_COMMUNITY)
Admission: RE | Admit: 2016-06-19 | Discharge: 2016-06-20 | Disposition: A | Discharge status: Home / Self Care | Payer: 59 | Source: Ambulatory Visit | Attending: Urology | Admitting: Urology

## 2016-06-19 ENCOUNTER — Ambulatory Visit (HOSPITAL_COMMUNITY): Payer: 59 | Admitting: Anesthesiology

## 2016-06-19 ENCOUNTER — Encounter (HOSPITAL_COMMUNITY): Payer: Self-pay | Admitting: *Deleted

## 2016-06-19 ENCOUNTER — Encounter (HOSPITAL_COMMUNITY)
Admission: RE | Disposition: A | Discharge status: Home / Self Care | Payer: Self-pay | Source: Ambulatory Visit | Attending: Urology

## 2016-06-19 DIAGNOSIS — R011 Cardiac murmur, unspecified: Secondary | ICD-10-CM | POA: Insufficient documentation

## 2016-06-19 DIAGNOSIS — F329 Major depressive disorder, single episode, unspecified: Secondary | ICD-10-CM | POA: Insufficient documentation

## 2016-06-19 DIAGNOSIS — N8110 Cystocele, unspecified: Secondary | ICD-10-CM | POA: Diagnosis not present

## 2016-06-19 DIAGNOSIS — J45909 Unspecified asthma, uncomplicated: Secondary | ICD-10-CM | POA: Diagnosis not present

## 2016-06-19 DIAGNOSIS — Z95810 Presence of automatic (implantable) cardiac defibrillator: Secondary | ICD-10-CM | POA: Diagnosis not present

## 2016-06-19 DIAGNOSIS — Z90710 Acquired absence of both cervix and uterus: Secondary | ICD-10-CM | POA: Insufficient documentation

## 2016-06-19 DIAGNOSIS — Z86718 Personal history of other venous thrombosis and embolism: Secondary | ICD-10-CM | POA: Insufficient documentation

## 2016-06-19 DIAGNOSIS — Z7982 Long term (current) use of aspirin: Secondary | ICD-10-CM | POA: Diagnosis not present

## 2016-06-19 DIAGNOSIS — N819 Female genital prolapse, unspecified: Secondary | ICD-10-CM | POA: Diagnosis not present

## 2016-06-19 DIAGNOSIS — N993 Prolapse of vaginal vault after hysterectomy: Secondary | ICD-10-CM | POA: Diagnosis not present

## 2016-06-19 DIAGNOSIS — Z882 Allergy status to sulfonamides status: Secondary | ICD-10-CM | POA: Diagnosis not present

## 2016-06-19 DIAGNOSIS — N8111 Cystocele, midline: Secondary | ICD-10-CM | POA: Diagnosis present

## 2016-06-19 DIAGNOSIS — Z881 Allergy status to other antibiotic agents status: Secondary | ICD-10-CM | POA: Diagnosis not present

## 2016-06-19 DIAGNOSIS — I209 Angina pectoris, unspecified: Secondary | ICD-10-CM | POA: Insufficient documentation

## 2016-06-19 DIAGNOSIS — I739 Peripheral vascular disease, unspecified: Secondary | ICD-10-CM | POA: Diagnosis not present

## 2016-06-19 DIAGNOSIS — R0602 Shortness of breath: Secondary | ICD-10-CM | POA: Diagnosis not present

## 2016-06-19 DIAGNOSIS — K589 Irritable bowel syndrome without diarrhea: Secondary | ICD-10-CM | POA: Insufficient documentation

## 2016-06-19 DIAGNOSIS — I493 Ventricular premature depolarization: Secondary | ICD-10-CM | POA: Diagnosis not present

## 2016-06-19 DIAGNOSIS — N3946 Mixed incontinence: Secondary | ICD-10-CM | POA: Insufficient documentation

## 2016-06-19 DIAGNOSIS — Z880 Allergy status to penicillin: Secondary | ICD-10-CM | POA: Diagnosis not present

## 2016-06-19 DIAGNOSIS — Z87891 Personal history of nicotine dependence: Secondary | ICD-10-CM | POA: Diagnosis not present

## 2016-06-19 HISTORY — PX: CYSTOCELE REPAIR: SHX163

## 2016-06-19 HISTORY — PX: CYSTOSCOPY: SHX5120

## 2016-06-19 LAB — APTT: APTT: 32 s (ref 24–36)

## 2016-06-19 LAB — PROTIME-INR
INR: 0.99
PROTHROMBIN TIME: 13.1 s (ref 11.4–15.2)

## 2016-06-19 LAB — HEMOGLOBIN AND HEMATOCRIT, BLOOD
HEMATOCRIT: 33.2 % — AB (ref 36.0–46.0)
Hemoglobin: 10.9 g/dL — ABNORMAL LOW (ref 12.0–15.0)

## 2016-06-19 LAB — CBC
HCT: 38.7 % (ref 36.0–46.0)
HEMOGLOBIN: 12.8 g/dL (ref 12.0–15.0)
MCH: 28.6 pg (ref 26.0–34.0)
MCHC: 33.1 g/dL (ref 30.0–36.0)
MCV: 86.4 fL (ref 78.0–100.0)
Platelets: 141 10*3/uL — ABNORMAL LOW (ref 150–400)
RBC: 4.48 MIL/uL (ref 3.87–5.11)
RDW: 12.7 % (ref 11.5–15.5)
WBC: 4.3 10*3/uL (ref 4.0–10.5)

## 2016-06-19 LAB — ABO/RH: ABO/RH(D): O POS

## 2016-06-19 LAB — TYPE AND SCREEN
ABO/RH(D): O POS
Antibody Screen: NEGATIVE

## 2016-06-19 SURGERY — CYSTOSCOPY
Anesthesia: General

## 2016-06-19 MED ORDER — GENTAMICIN SULFATE 40 MG/ML IJ SOLN
5.0000 mg/kg | INTRAVENOUS | Status: AC
  Administered 2016-06-19: 330 mg via INTRAVENOUS
  Filled 2016-06-19: qty 8.25

## 2016-06-19 MED ORDER — STERILE WATER FOR IRRIGATION IR SOLN
Status: DC | PRN
  Administered 2016-06-19: 3000 mL

## 2016-06-19 MED ORDER — SUGAMMADEX SODIUM 200 MG/2ML IV SOLN
INTRAVENOUS | Status: AC
Start: 2016-06-19 — End: 2016-06-19
  Filled 2016-06-19: qty 4

## 2016-06-19 MED ORDER — DEXTROSE-NACL 5-0.45 % IV SOLN
INTRAVENOUS | Status: DC
  Administered 2016-06-19 – 2016-06-20 (×3): via INTRAVENOUS

## 2016-06-19 MED ORDER — 0.9 % SODIUM CHLORIDE (POUR BTL) OPTIME
TOPICAL | Status: DC | PRN
  Administered 2016-06-19: 1000 mL

## 2016-06-19 MED ORDER — FENTANYL CITRATE (PF) 100 MCG/2ML IJ SOLN
INTRAMUSCULAR | Status: DC | PRN
  Administered 2016-06-19 (×2): 50 ug via INTRAVENOUS

## 2016-06-19 MED ORDER — GENTAMICIN IN SALINE 1.6-0.9 MG/ML-% IV SOLN: 80.0000 mg | INTRAVENOUS | Status: DC

## 2016-06-19 MED ORDER — CLINDAMYCIN PHOSPHATE 900 MG/50ML IV SOLN: 900.0000 mg | Freq: Once | INTRAVENOUS | Status: DC

## 2016-06-19 MED ORDER — HYDROMORPHONE HCL 1 MG/ML IJ SOLN
0.2500 mg | INTRAMUSCULAR | Status: DC | PRN
  Administered 2016-06-19: 0.5 mg via INTRAVENOUS

## 2016-06-19 MED ORDER — MIDAZOLAM HCL 5 MG/5ML IJ SOLN
INTRAMUSCULAR | Status: DC | PRN
  Administered 2016-06-19 (×2): 1 mg via INTRAVENOUS

## 2016-06-19 MED ORDER — PROMETHAZINE HCL 25 MG/ML IJ SOLN: 6.2500 mg | INTRAMUSCULAR | Status: DC | PRN

## 2016-06-19 MED ORDER — PHENAZOPYRIDINE HCL 200 MG PO TABS
200.0000 mg | ORAL_TABLET | Freq: Once | ORAL | Status: AC
  Administered 2016-06-19: 200 mg via ORAL
  Filled 2016-06-19: qty 1

## 2016-06-19 MED ORDER — MORPHINE SULFATE (PF) 2 MG/ML IV SOLN
2.0000 mg | INTRAVENOUS | Status: DC | PRN
  Administered 2016-06-19 (×4): 2 mg via INTRAVENOUS
  Filled 2016-06-19: qty 2
  Filled 2016-06-19 (×3): qty 1

## 2016-06-19 MED ORDER — ALBUMIN HUMAN 5 % IV SOLN
INTRAVENOUS | Status: DC | PRN
  Administered 2016-06-19 (×2): via INTRAVENOUS

## 2016-06-19 MED ORDER — PHENYLEPHRINE HCL 10 MG/ML IJ SOLN
INTRAVENOUS | Status: DC | PRN
  Administered 2016-06-19: 80 ug/min via INTRAVENOUS

## 2016-06-19 MED ORDER — DEXAMETHASONE SODIUM PHOSPHATE 10 MG/ML IJ SOLN
INTRAMUSCULAR | Status: AC
  Filled 2016-06-19: qty 1

## 2016-06-19 MED ORDER — LIDOCAINE-EPINEPHRINE (PF) 1 %-1:200000 IJ SOLN
INTRAMUSCULAR | Status: DC | PRN
  Administered 2016-06-19: 20 mL

## 2016-06-19 MED ORDER — LIDOCAINE-EPINEPHRINE 1 %-1:100000 IJ SOLN
INTRAMUSCULAR | Status: AC
  Filled 2016-06-19: qty 1

## 2016-06-19 MED ORDER — HYDROCODONE-ACETAMINOPHEN 5-325 MG PO TABS
1.0000 | ORAL_TABLET | ORAL | Status: DC | PRN
  Administered 2016-06-19 – 2016-06-20 (×4): 1 via ORAL
  Administered 2016-06-20: 2 via ORAL
  Filled 2016-06-19 (×2): qty 1
  Filled 2016-06-19: qty 2
  Filled 2016-06-19 (×2): qty 1

## 2016-06-19 MED ORDER — ALBUMIN HUMAN 5 % IV SOLN
INTRAVENOUS | Status: AC
  Filled 2016-06-19: qty 250

## 2016-06-19 MED ORDER — PROPOFOL 10 MG/ML IV BOLUS
INTRAVENOUS | Status: DC | PRN
  Administered 2016-06-19: 170 mg via INTRAVENOUS

## 2016-06-19 MED ORDER — LIDOCAINE-EPINEPHRINE (PF) 1 %-1:200000 IJ SOLN
INTRAMUSCULAR | Status: AC
  Filled 2016-06-19: qty 30

## 2016-06-19 MED ORDER — LIDOCAINE HCL (CARDIAC) 20 MG/ML IV SOLN
INTRAVENOUS | Status: DC | PRN
Start: 2016-06-19 — End: 2016-06-19
  Administered 2016-06-19: 80 mg via INTRAVENOUS

## 2016-06-19 MED ORDER — PHENYLEPHRINE HCL 10 MG/ML IJ SOLN
INTRAMUSCULAR | Status: AC
  Filled 2016-06-19: qty 1

## 2016-06-19 MED ORDER — HYDROCODONE-ACETAMINOPHEN 5-325 MG PO TABS: 1.0000 | ORAL_TABLET | Freq: Four times a day (QID) | ORAL | 0 refills | Status: DC | PRN

## 2016-06-19 MED ORDER — DIPHENHYDRAMINE HCL 50 MG/ML IJ SOLN: 12.5000 mg | Freq: Four times a day (QID) | INTRAMUSCULAR | Status: DC | PRN

## 2016-06-19 MED ORDER — ONDANSETRON HCL 4 MG/2ML IJ SOLN
INTRAMUSCULAR | Status: AC
  Filled 2016-06-19: qty 2

## 2016-06-19 MED ORDER — PHENYLEPHRINE HCL 10 MG/ML IJ SOLN
INTRAMUSCULAR | Status: DC | PRN
  Administered 2016-06-19 (×6): 40 ug via INTRAVENOUS

## 2016-06-19 MED ORDER — MIDAZOLAM HCL 2 MG/2ML IJ SOLN
INTRAMUSCULAR | Status: AC
  Filled 2016-06-19: qty 2

## 2016-06-19 MED ORDER — ACETAMINOPHEN 325 MG PO TABS: 650.0000 mg | ORAL_TABLET | ORAL | Status: DC | PRN

## 2016-06-19 MED ORDER — SUCCINYLCHOLINE CHLORIDE 20 MG/ML IJ SOLN
INTRAMUSCULAR | Status: AC
  Filled 2016-06-19: qty 1

## 2016-06-19 MED ORDER — PROPOFOL 10 MG/ML IV BOLUS
INTRAVENOUS | Status: AC
Start: 2016-06-19 — End: 2016-06-19
  Filled 2016-06-19: qty 20

## 2016-06-19 MED ORDER — ESTRADIOL 0.1 MG/GM VA CREA
TOPICAL_CREAM | VAGINAL | Status: DC | PRN
  Administered 2016-06-19: 1 via VAGINAL

## 2016-06-19 MED ORDER — CLINDAMYCIN PHOSPHATE 900 MG/50ML IV SOLN
900.0000 mg | Freq: Once | INTRAVENOUS | Status: AC
  Administered 2016-06-19: 900 mg via INTRAVENOUS
  Filled 2016-06-19: qty 50

## 2016-06-19 MED ORDER — FENTANYL CITRATE (PF) 100 MCG/2ML IJ SOLN
INTRAMUSCULAR | Status: AC
  Filled 2016-06-19: qty 2

## 2016-06-19 MED ORDER — ROCURONIUM BROMIDE 100 MG/10ML IV SOLN
INTRAVENOUS | Status: DC | PRN
  Administered 2016-06-19: 40 mg via INTRAVENOUS
  Administered 2016-06-19: 10 mg via INTRAVENOUS

## 2016-06-19 MED ORDER — CLINDAMYCIN PHOSPHATE 900 MG/50ML IV SOLN
INTRAVENOUS | Status: AC
  Filled 2016-06-19: qty 50

## 2016-06-19 MED ORDER — HYDROMORPHONE HCL 1 MG/ML IJ SOLN
INTRAMUSCULAR | Status: AC
  Filled 2016-06-19: qty 1

## 2016-06-19 MED ORDER — ESTRADIOL 0.1 MG/GM VA CREA
TOPICAL_CREAM | VAGINAL | Status: AC
  Filled 2016-06-19: qty 42.5

## 2016-06-19 MED ORDER — SODIUM CHLORIDE 0.9 % IR SOLN
Status: DC | PRN
  Administered 2016-06-19: 500 mL

## 2016-06-19 MED ORDER — DIPHENHYDRAMINE HCL 12.5 MG/5ML PO ELIX: 12.5000 mg | ORAL_SOLUTION | Freq: Four times a day (QID) | ORAL | Status: DC | PRN

## 2016-06-19 MED ORDER — LIDOCAINE 2% (20 MG/ML) 5 ML SYRINGE
INTRAMUSCULAR | Status: AC
  Filled 2016-06-19: qty 5

## 2016-06-19 MED ORDER — MONTELUKAST SODIUM 10 MG PO TABS
10.0000 mg | ORAL_TABLET | Freq: Every day | ORAL | Status: DC
  Administered 2016-06-19: 10 mg via ORAL
  Filled 2016-06-19: qty 1

## 2016-06-19 MED ORDER — SUGAMMADEX SODIUM 500 MG/5ML IV SOLN
INTRAVENOUS | Status: DC | PRN
  Administered 2016-06-19: 300 mg via INTRAVENOUS

## 2016-06-19 MED ORDER — PANTOPRAZOLE SODIUM 40 MG PO TBEC
40.0000 mg | DELAYED_RELEASE_TABLET | Freq: Every day | ORAL | Status: DC
  Administered 2016-06-19 – 2016-06-20 (×2): 40 mg via ORAL
  Filled 2016-06-19 (×2): qty 1

## 2016-06-19 MED ORDER — LACTATED RINGERS IV SOLN
INTRAVENOUS | Status: DC | PRN
  Administered 2016-06-19 (×2): via INTRAVENOUS

## 2016-06-19 MED ORDER — SOTALOL HCL 120 MG PO TABS
120.0000 mg | ORAL_TABLET | Freq: Two times a day (BID) | ORAL | Status: DC
  Filled 2016-06-19: qty 1

## 2016-06-19 MED ORDER — STERILE WATER FOR IRRIGATION IR SOLN
Status: DC | PRN
  Administered 2016-06-19: 1000 mL

## 2016-06-19 MED ORDER — EPHEDRINE SULFATE 50 MG/ML IJ SOLN
INTRAMUSCULAR | Status: DC | PRN
  Administered 2016-06-19: 10 mg via INTRAVENOUS
  Administered 2016-06-19: 7.5 mg via INTRAVENOUS
  Administered 2016-06-19: 2.5 mg via INTRAVENOUS

## 2016-06-19 MED ORDER — ROCURONIUM BROMIDE 50 MG/5ML IV SOSY
PREFILLED_SYRINGE | INTRAVENOUS | Status: AC
  Filled 2016-06-19: qty 5

## 2016-06-19 MED ORDER — DEXAMETHASONE SODIUM PHOSPHATE 10 MG/ML IJ SOLN
INTRAMUSCULAR | Status: DC | PRN
  Administered 2016-06-19: 10 mg via INTRAVENOUS

## 2016-06-19 MED ORDER — ONDANSETRON HCL 4 MG/2ML IJ SOLN
INTRAMUSCULAR | Status: DC | PRN
Start: 2016-06-19 — End: 2016-06-19
  Administered 2016-06-19: 4 mg via INTRAVENOUS

## 2016-06-19 MED ORDER — ONDANSETRON HCL 4 MG/2ML IJ SOLN
4.0000 mg | INTRAMUSCULAR | Status: DC | PRN
  Administered 2016-06-19: 4 mg via INTRAVENOUS
  Filled 2016-06-19: qty 2

## 2016-06-19 MED ORDER — SODIUM CHLORIDE 0.9 % IR SOLN
Status: AC
  Filled 2016-06-19: qty 500000

## 2016-06-19 MED ORDER — PHENYLEPHRINE 40 MCG/ML (10ML) SYRINGE FOR IV PUSH (FOR BLOOD PRESSURE SUPPORT)
PREFILLED_SYRINGE | INTRAVENOUS | Status: AC
  Filled 2016-06-19: qty 10

## 2016-06-19 SURGICAL SUPPLY — 49 items
ALLOGRAFT TUTOPLAST AXIS 6X12 (Tissue) ×1 IMPLANT
BAG URINE DRAINAGE (UROLOGICAL SUPPLIES) IMPLANT
BAG URO CATCHER STRL LF (MISCELLANEOUS) IMPLANT
BLADE SURG 15 STRL LF DISP TIS (BLADE) ×1 IMPLANT
BLADE SURG 15 STRL SS (BLADE) ×1
CATH FOLEY 2WAY SLVR  5CC 14FR (CATHETERS) ×1
CATH FOLEY 2WAY SLVR 5CC 14FR (CATHETERS) ×1 IMPLANT
CLOTH BEACON ORANGE TIMEOUT ST (SAFETY) ×2 IMPLANT
COVER MAYO STAND STRL (DRAPES) ×2 IMPLANT
COVER SURGICAL LIGHT HANDLE (MISCELLANEOUS) IMPLANT
DEVICE CAPIO SLIM SINGLE (INSTRUMENTS) ×2 IMPLANT
DRAIN PENROSE 18X1/4 LTX STRL (WOUND CARE) ×2 IMPLANT
DRAPE SHEET LG 3/4 BI-LAMINATE (DRAPES) ×2 IMPLANT
ELECT PENCIL ROCKER SW 15FT (MISCELLANEOUS) ×2 IMPLANT
GAUZE PACKING 1 X5 YD ST (GAUZE/BANDAGES/DRESSINGS) ×2 IMPLANT
GAUZE PACKING 2X5 YD STRL (GAUZE/BANDAGES/DRESSINGS) IMPLANT
GAUZE SPONGE 4X4 16PLY XRAY LF (GAUZE/BANDAGES/DRESSINGS) ×6 IMPLANT
GLOVE BIO SURGEON STRL SZ 6.5 (GLOVE) ×2 IMPLANT
GLOVE BIOGEL M STRL SZ7.5 (GLOVE) ×4 IMPLANT
GLOVE ECLIPSE 8.5 STRL (GLOVE) ×2 IMPLANT
GOWN STRL REUS W/TWL XL LVL3 (GOWN DISPOSABLE) ×4 IMPLANT
HOLDER FOLEY CATH W/STRAP (MISCELLANEOUS) ×2 IMPLANT
IV NS 1000ML (IV SOLUTION)
IV NS 1000ML BAXH (IV SOLUTION) IMPLANT
KIT BASIN OR (CUSTOM PROCEDURE TRAY) ×2 IMPLANT
NEEDLE HYPO 22GX1.5 SAFETY (NEEDLE) ×2 IMPLANT
NEEDLE MAYO 6 CRC TAPER PT (NEEDLE) ×2 IMPLANT
NS IRRIG 1000ML POUR BTL (IV SOLUTION) IMPLANT
PACK CYSTO (CUSTOM PROCEDURE TRAY) ×2 IMPLANT
PLUG CATH AND CAP STER (CATHETERS) ×2 IMPLANT
RETRACTOR STAY HOOK 5MM (MISCELLANEOUS) ×2 IMPLANT
SHEET LAVH (DRAPES) ×2 IMPLANT
SUT CAPIO ETHIBPND (SUTURE) ×4 IMPLANT
SUT VIC AB 0 CT1 27 (SUTURE) ×2
SUT VIC AB 0 CT1 27XBRD ANTBC (SUTURE) ×2 IMPLANT
SUT VIC AB 2-0 CT1 27 (SUTURE) ×2
SUT VIC AB 2-0 CT1 27XBRD (SUTURE) ×2 IMPLANT
SUT VIC AB 2-0 SH 27 (SUTURE) ×5
SUT VIC AB 2-0 SH 27X BRD (SUTURE) ×5 IMPLANT
SUT VIC AB 3-0 SH 27 (SUTURE) ×3
SUT VIC AB 3-0 SH 27XBRD (SUTURE) ×3 IMPLANT
SUT VICRYL 0 UR6 27IN ABS (SUTURE) ×2 IMPLANT
SYRINGE 10CC LL (SYRINGE) ×2 IMPLANT
TOWEL OR 17X26 10 PK STRL BLUE (TOWEL DISPOSABLE) ×2 IMPLANT
TOWEL OR NON WOVEN STRL DISP B (DISPOSABLE) ×2 IMPLANT
TUBING CONNECTING 10 (TUBING) ×2 IMPLANT
TUTOPLAST AXIS 6X12 (Tissue) ×2 IMPLANT
WATER STERILE IRR 1500ML POUR (IV SOLUTION) IMPLANT
YANKAUER SUCT BULB TIP 10FT TU (MISCELLANEOUS) ×2 IMPLANT

## 2016-06-19 NOTE — Transfer of Care (Signed)
Immediate Anesthesia Transfer of Care Note  Patient: Nicole Swanson  Procedure(s) Performed: Procedure(s): CYSTOSCOPY (N/A) ANTERIOR REPAIR (CYSTOCELE) WITH VAULT PROLAPSE (N/A)  Patient Location: PACU  Anesthesia Type:General  Level of Consciousness: awake, oriented, patient cooperative, lethargic and responds to stimulation  Airway & Oxygen Therapy: Patient Spontanous Breathing and Patient connected to face mask oxygen  Post-op Assessment: Report given to RN, Post -op Vital signs reviewed and stable and Patient moving all extremities  Post vital signs: Reviewed and stable  Last Vitals:  Vitals:   06/19/16 0557  BP: 120/66  Pulse: 64  Resp: 16  Temp: 36.6 C    Last Pain:  Vitals:   06/19/16 0557  TempSrc: Oral      Patients Stated Pain Goal: 3 ([id number])  Complications: No apparent anesthesia complications

## 2016-06-19 NOTE — Anesthesia Procedure Notes (Signed)
Procedure Name: Intubation Date/Time: 06/19/2016 7:34 AM Performed by: Duane Boston Pre-anesthesia Checklist: Patient identified, Emergency Drugs available, Suction available, Patient being monitored and Timeout performed Patient Re-evaluated:Patient Re-evaluated prior to inductionOxygen Delivery Method: Circle system utilized Preoxygenation: Pre-oxygenation with 100% oxygen Intubation Type: IV induction Ventilation: Mask ventilation without difficulty Laryngoscope Size: Mac and 3 Grade View: Grade I Tube type: Oral Tube size: 7.5 mm Number of attempts: 1 Airway Equipment and Method: Stylet Placement Confirmation: ETT inserted through vocal cords under direct vision,  positive ETCO2 and breath sounds checked- equal and bilateral Secured at: 21 cm Tube secured with: Tape Dental Injury: Teeth and Oropharynx as per pre-operative assessment

## 2016-06-19 NOTE — Progress Notes (Signed)
Pt with moderate amount of bloody drainage from vag packing on bed pad. Pt assisted up, cleansed, and peripad and mesh underwear applied. Will monitor.

## 2016-06-19 NOTE — Interval H&P Note (Signed)
History and Physical Interval Note:  06/19/2016 7:06 AM  Nicole Swanson  has presented today for surgery, with the diagnosis of CYSTOCELE,VAULT PROLASPE  The various methods of treatment have been discussed with the patient and family. After consideration of risks, benefits and other options for treatment, the patient has consented to  Procedure(s): CYSTOSCOPY (N/A) ANTERIOR REPAIR (CYSTOCELE) WITH VAULT PROLAPSE (N/A) as a surgical intervention .  The patient's history has been reviewed, patient examined, no change in status, stable for surgery.  I have reviewed the patient's chart and labs.  Questions were answered to the patient's satisfaction.     Kerri Asche A

## 2016-06-19 NOTE — Care Management Note (Signed)
Case Management Note  Patient Details  Name: Nicole Swanson MRN: [id number] Date of Birth: December 16, 1955  Subjective/Objective: 60 y/o f admitted w/cystocele, & vault prolapse, s/p repair. From home.                   Action/Plan:d/c home.   Expected Discharge Date:                  Expected Discharge Plan:  Home/Self Care  In-House Referral:     Discharge planning Services  CM Consult  Post Acute Care Choice:    Choice offered to:     DME Arranged:    DME Agency:     HH Arranged:    HH Agency:     Status of Service:  In process, will continue to follow  If discussed at Long Length of Stay Meetings, dates discussed:    Additional Comments:  Dessa Phi, RN 06/19/2016, 12:19 PM

## 2016-06-19 NOTE — Progress Notes (Signed)
Pt arrived from PACU, A&Ox4. Slid self to bed from stretcher. VSS. Vag packing in place with minimal bloody drainage. Pt oriented to callbell and environment. POC discussed. Pt medicated for pain. Will monitor.

## 2016-06-19 NOTE — Anesthesia Postprocedure Evaluation (Signed)
Anesthesia Post Note  Patient: Nicole Swanson  Procedure(s) Performed: Procedure(s) (LRB): CYSTOSCOPY (N/A) ANTERIOR REPAIR (CYSTOCELE) WITH VAULT PROLAPSE (N/A)  Patient location during evaluation: PACU Anesthesia Type: General Level of consciousness: sedated Pain management: pain level controlled Vital Signs Assessment: post-procedure vital signs reviewed and stable Respiratory status: spontaneous breathing and respiratory function stable Cardiovascular status: stable Anesthetic complications: no    Last Vitals:  Vitals:   06/19/16 1100 06/19/16 1125  BP: 107/62 108/62  Pulse: (!) 59 64  Resp: 16 16  Temp: 36.3 C 36.3 C    Last Pain:  Vitals:   06/19/16 1100  TempSrc:   PainSc: 1                  Telisha Zawadzki DANIEL

## 2016-06-19 NOTE — Op Note (Signed)
Preoperative diagnosis: Cystocele and vault prolapse Postoperative diagnosis: Cystocele and vault prolapse Surgery: Vault prolapse repair and cystocele repair and graft Surgeon: Dr. Nicki Reaper Akio Hudnall Asst.: Debbrah Alar   The assistant was present and necessary for all steps of the operation described. The assistant played a critical role assisting during the operation  The patient had the above diagnoses and consented to the above procedure. Extra care was taken with leg positioning to minimize the risk of compartment syndrome and neuropathy and deep vein thrombosis. The patient's vaginal vault was descended approximately 5 cm. The vaginal cuff was marked with a 3-0 Vicryl suture. I took a lot of time to identify the cuff. She had a very short anterior vaginal wall and I kept the suture at the apex diligently closer to the posterior wall. At rest she also had shortening of the vagina.  I instilled 20 mL of a lidocaine epinephrine mixture. I made my usual T shaped incision and mobilized the anterior vaginal wall from the underlying pubocervical fascia. I was very careful with mobilization at the apex. I truly believe I was near the peritoneal reflection especially on the patient's left side. I continued to mobilize carefully to give myself enough length for the anterior repair and also for the sacrospinous fixation. I was pleased with it.  She had a very short anterior vaginal wall. She had an impressive half of a golf ball size fascial defect with an associated cystocele. I mobilized laterally to the white line and again there was not a lot of length and she had somewhat of a trapdoor deformity associated with her defect.  I closed the anterior vaginal wall with running 2-0 Vicryl and CT1 needle. I was very pleased with the closure and I was careful at the apex recognizing there was very little supporting tissue in that location.  I then cystoscoped the patient. There is no bladder injury. The  ureters were not distorted. There was excellent yellow jets bilaterally. There was a small camel hump in the midline  I was very careful at the apex because of her short anterior vaginal wall and narrow pelvis to find the exact plane and finger dissected to the ischial spine bilaterally. I mobilized soft tissue medially. With a Capio device I placed a 0 Ethibond into the sacrospinous ligament 1 fingerbreadth medial to the spine in a straight line between the spines. This was triple checked and I was very happy with the position and strength of the sutures. I did a rectal examination and there was no rectal injury.  Using my usual technique I placed a 0 Vicryl on a UR 6 into the pelvic sidewall near the urethrovesical angle  A 12 x 6 dermal graft was cut to 10 x 6. It was shaped in the shape of a trapezoid. It was placed in between the 4 sutures tension-free.  I trimmed only a few millimeters of anterior vaginal wall. I closed the anterior vaginal wall with running 2-0 Vicryl on a CT1 needle  At the end of the case I was very pleased with the surgery. Again the patient had a shorter vagina which was not shortened further. Blood loss was approximately <150 mL. Leg position was excellent. There was clear yellow urine of high-volume throughout the case  The patient's blood pressure was a bit low during aspects of the case where she really didn't have much bleeding. Her labs will be checked in the recovery room. She was stable otherwise  A vaginal pack with Estrace  cream was applied firmly.  I was very pleased with the surgery. Hopefully the patient reaches her treatment goal.

## 2016-06-20 ENCOUNTER — Encounter (HOSPITAL_COMMUNITY): Payer: Self-pay | Admitting: Urology

## 2016-06-20 DIAGNOSIS — I493 Ventricular premature depolarization: Secondary | ICD-10-CM | POA: Diagnosis not present

## 2016-06-20 DIAGNOSIS — I739 Peripheral vascular disease, unspecified: Secondary | ICD-10-CM | POA: Diagnosis not present

## 2016-06-20 DIAGNOSIS — F329 Major depressive disorder, single episode, unspecified: Secondary | ICD-10-CM | POA: Diagnosis not present

## 2016-06-20 DIAGNOSIS — N8110 Cystocele, unspecified: Secondary | ICD-10-CM | POA: Diagnosis not present

## 2016-06-20 DIAGNOSIS — J45909 Unspecified asthma, uncomplicated: Secondary | ICD-10-CM | POA: Diagnosis not present

## 2016-06-20 DIAGNOSIS — R011 Cardiac murmur, unspecified: Secondary | ICD-10-CM | POA: Diagnosis not present

## 2016-06-20 DIAGNOSIS — I209 Angina pectoris, unspecified: Secondary | ICD-10-CM | POA: Diagnosis not present

## 2016-06-20 DIAGNOSIS — K589 Irritable bowel syndrome without diarrhea: Secondary | ICD-10-CM | POA: Diagnosis not present

## 2016-06-20 DIAGNOSIS — N3946 Mixed incontinence: Secondary | ICD-10-CM | POA: Diagnosis not present

## 2016-06-20 LAB — BASIC METABOLIC PANEL
Anion gap: 4 — ABNORMAL LOW (ref 5–15)
BUN: 9 mg/dL (ref 6–20)
CHLORIDE: 107 mmol/L (ref 101–111)
CO2: 25 mmol/L (ref 22–32)
CREATININE: 0.7 mg/dL (ref 0.44–1.00)
Calcium: 8.9 mg/dL (ref 8.9–10.3)
GFR calc Af Amer: >60 mL/min (ref >60)
GFR calc non Af Amer: >60 mL/min (ref >60)
GLUCOSE: 145 mg/dL — AB (ref 65–99)
POTASSIUM: 3.9 mmol/L (ref 3.5–5.1)
SODIUM: 136 mmol/L (ref 135–145)

## 2016-06-20 LAB — HEMOGLOBIN AND HEMATOCRIT, BLOOD
HCT: 31.9 % — ABNORMAL LOW (ref 36.0–46.0)
Hemoglobin: 10.2 g/dL — ABNORMAL LOW (ref 12.0–15.0)

## 2016-06-20 MED ORDER — SOTALOL HCL 120 MG PO TABS: 120.0000 mg | ORAL_TABLET | Freq: Two times a day (BID) | ORAL | Status: DC

## 2016-06-20 MED ORDER — SENNA 8.6 MG PO TABS: 1.0000 | ORAL_TABLET | Freq: Every day | ORAL | 0 refills | Status: DC

## 2016-06-20 MED ORDER — SENNA 8.6 MG PO TABS: 1.0000 | ORAL_TABLET | Freq: Every day | ORAL | Status: DC

## 2016-06-20 MED ORDER — DOCUSATE SODIUM 100 MG PO CAPS: 100.0000 mg | ORAL_CAPSULE | Freq: Two times a day (BID) | ORAL | Status: DC

## 2016-06-20 MED FILL — HYDROCODON-APAP 5-325: 5-325 | 4 days supply | Qty: 30 | Fill #0

## 2016-06-20 NOTE — Progress Notes (Signed)
Pt voided 125cc.  PVR <20.  Dr. Matilde Sprang notified and pt ready for discharge.  Ambulating in hall. Andre Lefort

## 2016-06-20 NOTE — Care Management Note (Signed)
Case Management Note  Patient Details  Name: Nicole Swanson MRN: [id number] Date of Birth: Oct 30, 1955  Subjective/Objective:                    Action/Plan:d/c home no needs or orders.   Expected Discharge Date:                  Expected Discharge Plan:  Home/Self Care  In-House Referral:     Discharge planning Services  CM Consult  Post Acute Care Choice:    Choice offered to:     DME Arranged:    DME Agency:     HH Arranged:    [place] Agency:     Status of Service:  Completed, signed off  If discussed at [employer] of Stay Meetings, dates discussed:    Additional Comments:  Dessa Phi, RN 06/20/2016, 10:39 AM

## 2016-06-20 NOTE — Progress Notes (Signed)
Looks great No signs of hypotention and tends to run low BP Vitals and labs normal Post op detailed

## 2016-06-20 NOTE — Discharge Summary (Signed)
Date of admission: 06/19/2016  Date of discharge: 06/20/2016  Admission diagnosis: Cystocele  Discharge diagnosis: Cystocele  Secondary diagnoses: Vault prolapse  History and Physical: For full details, please see admission history and physical. Briefly, Nicole Swanson is a 60 y.o. year old patient with the above diagnosis.   Hospital Course: Prolapse surgery excellent. Very good post op course  Laboratory values:  Recent Labs  06/19/16 0600 06/19/16 1043 06/20/16 0512  HGB 12.8 10.9* 10.2*  HCT 38.7 33.2* 31.9*    Recent Labs  06/20/16 0512  CREATININE 0.70    Disposition: Home  Discharge instruction: The patient was instructed to be ambulatory but told to refrain from heavy lifting, strenuous activity, or driving. Detailed  Discharge medications:    Medication List    STOP taking these medications   aspirin 81 MG tablet   CALCIUM PLUS VITAMIN D3 PO     TAKE these medications   DEXILANT 60 MG capsule Generic drug:  dexlansoprazole Take 60 mg by mouth daily as needed (heartburn).   docusate sodium 250 MG capsule Commonly known as:  COLACE Take 250 mg by mouth 2 (two) times daily.   fexofenadine 180 MG tablet Commonly known as:  ALLEGRA Take 180 mg by mouth daily as needed for allergies or rhinitis.   fluticasone 50 MCG/ACT nasal spray Commonly known as:  FLONASE USE 1 SPRAY IN EACH NOSTRIL DAILY   HYDROcodone-acetaminophen 5-325 MG tablet Commonly known as:  NORCO Take 1-2 tablets by mouth every 6 (six) hours as needed for moderate pain or severe pain.   montelukast 10 MG tablet Commonly known as:  SINGULAIR TAKE 1 TABLET BY MOUTH AT BEDTIME.   sotalol 120 MG tablet Commonly known as:  BETAPACE TAKE 1 TABLET BY MOUTH 2 TIMES DAILY.       Followup:  Follow-up Information    Darletta Noblett A, MD.   Specialty:  Urology Why:  office will call you with date and time of appointment Contact information: [place]  [address] [phone]

## 2016-06-20 NOTE — Progress Notes (Signed)
UROLOGY PROGRESS NOTES  Assessment/Plan: Nicole Swanson is a 60 y.o. female with a history of cardiac arrest s/p defibrillator placement, asthma, DVT, IBS, Raynaud's, and cystocele s/p cystocele and vaginal vault prolapse repair 06/19/16 with Dr. Matilde Sprang.   Interval/Plan: Doing well. Some hypotension but low at baseline and asymptomatic with normal HR and excellent UOP. Afebrile. 2.5 UOP with 1.3 overnight. Cr 0.7. Hb 10.2 from 10.9 postop.   Neuro: - pain control with PRN tylenol, vicodin, morphine - benadryl 12.5mg  PRN  Respiratory: SORA - singulair 10mg  po qd  CV: hypotension (last BP 86/46) - home sotalol 120mg  PO BID - hold this morning's dose  FEN/GI: - medlock, replete lytes prn, advance to regular diet diet - bowel regimen with senna/colace - PRN zofran  - protonix 40mg  po qd  GU: - monitor UOP - Foley catheter in place, discontinue today and perform TOV - creatinine 0.7 - remove vaginal packing this morning  Heme/ID: - afebrile, stable - Hb stable, continue to monitor  PPx: OOB/IS  Dispo: Floor status, probable discharge later this morning/afternoon after TOV   Subjective: Feels well, no n/v overnight. Tolerating diet. Pain about a 5/10. OOB since surgery. No SOB, Cp, lightheadedness, dizziness. Thinks her BP always is low.   Objective:  Vital signs in last 24 hours: Temp:  [97.4 F (36.3 C)-97.9 F (36.6 C)] 97.5 F (36.4 C) (10/18 0551) Pulse Rate:  [59-64] 60 (10/18 0551) Resp:  [12-18] 16 (10/18 0551) BP: (86-121)/(41-71) 86/46 (10/18 0551) SpO2:  [96 %-100 %] 96 % (10/18 0551)  10/17 0701 - 10/18 0700 In: [id number] [P.O.:690; I.V.:4375; IV Piggyback:500] Out: 2600 [Urine:2475; Blood:125]    Physical Exam:  General:  well-developed and well-nourished female in NAD, lying in bed, alert & oriented, pleasant HEENT: [place]/AT, EOMI, sclera anicteric, hearing grossly intact, no nasal discharge, MMM Respiratory: nonlabored respirations, satting well on  RA, symmetrical chest rise Cardiovascular: pulse regular rate & rhythm Abdominal: soft, NTTP, nondistended GU: VP and overlying pad with moderate dried old blood, no signs of active bleeding, Foley draining light clear yellow urine Extremities: warm, well-perfused, no c/c/e Neuro: no focal deficits   Data Review: Results for orders placed or performed during the hospital encounter of 06/19/16 (from the past 24 hour(s))  Hemoglobin and hematocrit, blood     Status: Abnormal   Collection Time: 06/19/16 10:43 AM  Result Value Ref Range   Hemoglobin 10.9 (L) 12.0 - 15.0 g/dL   HCT 33.2 (L) 36.0 - [id number] %  Basic metabolic panel     Status: Abnormal   Collection Time: 06/20/16  5:12 AM  Result Value Ref Range   Sodium 136 135 - 145 mmol/L   Potassium 3.9 3.5 - 5.1 mmol/L   Chloride 107 101 - 111 mmol/L   CO2 25 22 - 32 mmol/L   Glucose, Bld 145 (H) 65 - 99 mg/dL   BUN 9 6 - 20 mg/dL   Creatinine, Ser 0.70 0.44 - 1.00 mg/dL   Calcium 8.9 8.9 - 10.3 mg/dL   GFR calc non Af Amer >60 >60 mL/min   GFR calc Af Amer >60 >60 mL/min   Anion gap 4 (L) 5 - 15  Hemoglobin and hematocrit, blood     Status: Abnormal   Collection Time: 06/20/16  5:12 AM  Result Value Ref Range   Hemoglobin 10.2 (L) 12.0 - 15.0 g/dL   HCT 31.9 (L) 36.0 - 46.0 %    Imaging: None

## 2016-06-20 NOTE — Discharge Instructions (Signed)
I have reviewed discharge instructions in detail with the patient. They will follow-up with me or their physician as scheduled. My nurse will also be calling the patients as per protocol. As discussed with Dr. Tabor Denham. ° °You may resume aspirin, advil, aleve, vitamins, and supplements 7 days after surgery. °

## 2016-06-25 DIAGNOSIS — J4520 Mild intermittent asthma, uncomplicated: Secondary | ICD-10-CM | POA: Diagnosis not present

## 2016-06-25 DIAGNOSIS — R0602 Shortness of breath: Secondary | ICD-10-CM | POA: Diagnosis not present

## 2016-06-27 DIAGNOSIS — N390 Urinary tract infection, site not specified: Secondary | ICD-10-CM | POA: Diagnosis not present

## 2016-06-27 DIAGNOSIS — N993 Prolapse of vaginal vault after hysterectomy: Secondary | ICD-10-CM | POA: Diagnosis not present

## 2016-06-27 DIAGNOSIS — B957 Other staphylococcus as the cause of diseases classified elsewhere: Secondary | ICD-10-CM | POA: Diagnosis not present

## 2016-07-21 ENCOUNTER — Ambulatory Visit
Admission: EM | Admit: 2016-07-21 | Discharge: 2016-07-21 | Disposition: A | Discharge status: Home / Self Care | Payer: 59 | Attending: Family Medicine | Admitting: Family Medicine

## 2016-07-21 DIAGNOSIS — N390 Urinary tract infection, site not specified: Secondary | ICD-10-CM | POA: Diagnosis not present

## 2016-07-21 DIAGNOSIS — R319 Hematuria, unspecified: Secondary | ICD-10-CM

## 2016-07-21 LAB — URINALYSIS COMPLETE WITH MICROSCOPIC (ARMC ONLY)
GLUCOSE, UA: NEGATIVE mg/dL
Ketones, ur: 15 mg/dL — AB
NITRITE: NEGATIVE
PH: 5 (ref 5.0–8.0)
Protein, ur: 30 mg/dL — AB
Specific Gravity, Urine: >1.03 — ABNORMAL HIGH (ref 1.005–1.030)

## 2016-07-21 MED ORDER — PHENAZOPYRIDINE HCL 200 MG PO TABS: 200.0000 mg | ORAL_TABLET | Freq: Three times a day (TID) | ORAL | 0 refills | Status: DC | PRN

## 2016-07-21 MED ORDER — NITROFURANTOIN MACROCRYSTAL 100 MG PO CAPS: 100.0000 mg | ORAL_CAPSULE | Freq: Four times a day (QID) | ORAL | 0 refills | Status: DC

## 2016-07-21 MED ORDER — FLUCONAZOLE 150 MG PO TABS: 150.0000 mg | ORAL_TABLET | Freq: Once | ORAL | 0 refills | Status: AC

## 2016-07-21 NOTE — ED Triage Notes (Signed)
Pt with frequency and pain after urination x past 2 days. Low back pain and low abd pain 4/10

## 2016-07-21 NOTE — ED Provider Notes (Signed)
[facility]    CSN: [id number] Arrival date & time: 07/21/16  1423     History   Chief Complaint Chief Complaint  Patient presents with  . Urinary Frequency    HPI Nicole Swanson is a 60 y.o. female.   Patient's here because of frequency and discomfort before she urinates and after she urinates. She does not have actual burning while urinating but she does have pressure in her bladder. She states is a pressure of low abdominal pain. She has had frequent UTIs since his recent had a bladder sling in October. She was placed on antibiotics does not know the name of. She has had sudden death in his been defibrillated at least 6 different times and has a defibrillator implant as well. She is allergic to several medications and cannot take quinolones due to the prolongation of QT interval. She's had DVT angina allergies and asthma. Also recently had a bladder repair she's had abdominal hysterectomy appendectomy and carpal tunnel release as well as cardiac defibrillation. She is allergic to vancomycin sulfur and penicillins. The history of heart disease in mother and father stroke mother father as well seizures in brother's daughter   The history is provided by the patient. No language interpreter was used.  Urinary Frequency  This is a new problem. The problem has not changed since onset.Nothing aggravates the symptoms. She has tried nothing for the symptoms. The treatment provided no relief.    Past Medical History:  Diagnosis Date  . Aborted cardiac arrest 1994  . AICD (automatic cardioverter/defibrillator) present   . Allergy   . Anginal pain ([place])   . Arthritis   . Asthma    hx of asthma related to air bag release   . Dual implantable cardiac defibrillator    Medtronic initially implanted in 1997  . DVT (deep vein thrombosis) in pregnancy ([place]) 1986   had heparin shots followed by coumadin  . Family history of seizures    Question neurological versus cardiac  .  Heart murmur   . Irritable bowel syndrome   . 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 PONV (postoperative nausea and vomiting)   . Presence of permanent cardiac pacemaker   . PVC's (premature ventricular contractions)   . Raynaud's syndrome   . Shortness of breath dyspnea    with exertion   . Ventricular tachycardia [facility])     Patient Active Problem List   Diagnosis Date Noted  . Cystocele, midline 06/19/2016  . Non-dose-related adverse reaction to medication 03/13/2016  . Urinary frequency 09/16/2015  . Bladder prolapse, female, acquired 09/08/2015  . DDD (degenerative disc disease), lumbar 01/25/2015  . Sacroiliac joint dysfunction of both sides 01/25/2015  . Spinal stenosis, lumbar region, with neurogenic claudication 01/25/2015  . Facet syndrome, lumbar 01/25/2015  . Lumbar radiculopathy 01/25/2015  . Gallstones 12/23/2014  . Polyarthritis 03/16/2014  . Thrombocytopenia ([place]) 09/11/2013  . Other malaise and fatigue 09/10/2013  . Major depressive disorder, single episode 09/10/2013  . Raynaud phenomenon 04/17/2013  . Low back pain 12/05/2012  . Shortness of breath 06/12/2012  . Aborted cardiac arrest   . ICD-Medtronic   . PVC's / ventricular tachycardia   . 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 Irritable bowel syndrome     Past Surgical History:  Procedure Laterality Date  . ABDOMINAL HYSTERECTOMY    . APPENDECTOMY    . CARDIAC DEFIBRILLATOR PLACEMENT  1994   Dual chamber with pacemaker  .  CARPAL TUNNEL RELEASE Right   . CYSTOCELE REPAIR N/A 06/19/2016   Procedure: ANTERIOR REPAIR (CYSTOCELE) WITH VAULT PROLAPSE;  Surgeon: Bjorn Loser, MD;  Location: [facility];  Service: Urology;  Laterality: N/A;  . CYSTOSCOPY N/A 06/19/2016   Procedure: CYSTOSCOPY;  Surgeon: Bjorn Loser, MD;  Location: [facility];  Service: Urology;  Laterality: N/A;  . LAPAROSCOPIC LYSIS OF ADHESIONS    . NASAL SEPTUM SURGERY  [address]   .  OOPHORECTOMY    . PACEMAKER INSERTION     5 different devices    OB History    No data available       Home Medications    Prior to Admission medications   Medication Sig Start Date End Date Taking? Authorizing Provider  Calcium Carbonate-Vitamin D (CALCIUM-VITAMIN D) 500-200 MG-UNIT tablet Take 1 tablet by mouth daily.   Yes Historical Provider, MD  dexlansoprazole (DEXILANT) 60 MG capsule Take 60 mg by mouth daily as needed (heartburn).    Historical Provider, MD  docusate sodium (COLACE) 250 MG capsule Take 250 mg by mouth 2 (two) times daily.    Historical Provider, MD  fexofenadine (ALLEGRA) 180 MG tablet Take 180 mg by mouth daily as needed for allergies or rhinitis.     Historical Provider, MD  fluconazole (DIFLUCAN) 150 MG tablet Take 1 tablet (150 mg total) by mouth once. 07/21/16 07/21/16  Frederich Cha, MD  fluticasone [facility]) 50 MCG/ACT nasal spray USE 1 SPRAY IN EACH NOSTRIL DAILY 09/06/15   [name], MD  HYDROcodone-acetaminophen (NORCO) 5-325 MG tablet Take 1-2 tablets by mouth every 6 (six) hours as needed for moderate pain or severe pain. 06/19/16   Debbrah Alar, PA-C  montelukast (SINGULAIR) 10 MG tablet TAKE 1 TABLET BY MOUTH AT BEDTIME. 11/14/15   [name], MD  nitrofurantoin (MACRODANTIN) 100 MG capsule Take 1 capsule (100 mg total) by mouth 4 (four) times daily. 07/21/16   Frederich Cha, MD  phenazopyridine (PYRIDIUM) 200 MG tablet Take 1 tablet (200 mg total) by mouth 3 (three) times daily as needed for pain. 07/21/16   Frederich Cha, MD  senna (SENOKOT) 8.6 MG TABS tablet Take 1 tablet (8.6 mg total) by mouth daily. 06/20/16   Bjorn Loser, MD  sotalol (BETAPACE) 120 MG tablet TAKE 1 TABLET BY MOUTH 2 TIMES DAILY. 05/02/16   Deboraha Sprang, MD    Family History Family History  Problem Relation Age of Onset  . Heart disease Mother 77    A Fib, CHF  . Stroke Mother   . Heart disease Father   . Stroke Father   . Seizures Brother   . Seizures Daughter      Social History Social History  Substance Use Topics  . Smoking status: Former Smoker    Types: Cigarettes    Quit date: 06/04/1995  . Smokeless tobacco: Never Used  . Alcohol use 0.0 oz/week     Comment: occasional     Allergies   Vancomycin; Sulfa antibiotics; and Penicillins   Review of Systems Review of Systems  Genitourinary: Positive for difficulty urinating, frequency and urgency.  All other systems reviewed and are negative.    Physical Exam Triage Vital Signs ED Triage Vitals  Enc Vitals Group     BP 07/21/16 1433 118/65     Pulse Rate 07/21/16 1433 70     Resp 07/21/16 1433 16     Temp 07/21/16 1433 97.5 F (36.4 C)     Temp Source  07/21/16 1433 Oral     SpO2 07/21/16 1433 99 %     Weight 07/21/16 1435 140 lb (63.5 kg)     Height 07/21/16 1435 5\' 7"  (1.702 m)     Head Circumference --      Peak Flow --      Pain Score 07/21/16 1439 2     Pain Loc --      Pain Edu? --      Excl. in [place]? --    No data found.   Updated Vital Signs BP 118/65 (BP Location: Right Arm)   Pulse 70   Temp 97.5 F (36.4 C) (Oral)   Resp 16   Ht 5\' 7"  (1.702 m)   Wt 140 lb (63.5 kg)   SpO2 99%   BMI 21.93 kg/m   Visual Acuity Right Eye Distance:   Left Eye Distance:   Bilateral Distance:    Right Eye Near:   Left Eye Near:    Bilateral Near:     Physical Exam  Constitutional: She is oriented to person, place, and time. She appears well-developed and well-nourished.  HENT:  Head: Normocephalic and atraumatic.  Eyes: Pupils are equal, round, and reactive to light.  Neck: Normal range of motion.  Abdominal: Soft. Bowel sounds are normal.  Musculoskeletal: Normal range of motion. She exhibits no deformity.  Neurological: She is alert and oriented to person, place, and time.  Skin: Skin is warm.  Psychiatric: She has a normal mood and affect.  Vitals reviewed.    UC Treatments / Results  Labs (all labs ordered are listed, but only abnormal results are  displayed) Labs Reviewed  URINALYSIS COMPLETEWITH MICROSCOPIC ([facility] ONLY) - Abnormal; Notable for the following:       Result Value   APPearance HAZY (*)    Bilirubin Urine SMALL (*)    Ketones, ur 15 (*)    Specific Gravity, Urine >1.030 (*)    Hgb urine dipstick TRACE (*)    Protein, ur 30 (*)    Leukocytes, UA SMALL (*)    Bacteria, UA RARE (*)    Squamous Epithelial / LPF 6-30 (*)    All other components within normal limits  URINE CULTURE    EKG  EKG Interpretation None       Radiology No results found.  Procedures Procedures (including critical care time)  Medications Ordered in UC Medications - No data to display   Initial Impression / Assessment and Plan / UC Course  I have reviewed the triage vital signs and the nursing notes.  Pertinent labs & imaging results that were available during my care of the patient were reviewed by me and considered in my medical decision making (see chart for details).  Clinical Course     We'll place patient on Macrobid there is no contraindication a week to find to [facility] for QT prolongation. We'll place on Macrobid 100 mg twice day for 7 days Pyridium for 5 days and 2 Diflucan tablets recorded as well. We'll obtain urine culture on her also.  Final Clinical Impressions(s) / UC Diagnoses   Final diagnoses:  Urinary tract infection with hematuria, site unspecified  Lower urinary tract infectious disease    New Prescriptions Discharge Medication List as of 07/21/2016  3:23 PM    START taking these medications   Details  fluconazole (DIFLUCAN) 150 MG tablet Take 1 tablet (150 mg total) by mouth once., Starting Sat 07/21/2016, Normal    nitrofurantoin (MACRODANTIN) 100 MG capsule  Take 1 capsule (100 mg total) by mouth 4 (four) times daily., Starting Sat 07/21/2016, Normal    phenazopyridine (PYRIDIUM) 200 MG tablet Take 1 tablet (200 mg total) by mouth 3 (three) times daily as needed for pain., Starting Sat 07/21/2016,  Normal      Note: This dictation was prepared with Dragon dictation along with smaller phrase technology. Any transcriptional errors that result from this process are unintentional.   Frederich Cha, MD 07/21/16 1547

## 2016-07-23 ENCOUNTER — Telehealth: Payer: Self-pay | Admitting: *Deleted

## 2016-07-23 ENCOUNTER — Ambulatory Visit (INDEPENDENT_AMBULATORY_CARE_PROVIDER_SITE_OTHER): Payer: 59 | Admitting: Family

## 2016-07-23 ENCOUNTER — Encounter: Payer: Self-pay | Admitting: Family

## 2016-07-23 VITALS — BP 130/72 | HR 84 | Temp 97.6°F | Ht 67.0 in | Wt 142.2 lb

## 2016-07-23 DIAGNOSIS — N3001 Acute cystitis with hematuria: Secondary | ICD-10-CM

## 2016-07-23 LAB — POCT URINALYSIS DIPSTICK
Nitrite, UA: POSITIVE
PH UA: 5
SPEC GRAV UA: 1.01
Urobilinogen, UA: 4

## 2016-07-23 LAB — URINE CULTURE: Special Requests: NORMAL

## 2016-07-23 MED ORDER — FOSFOMYCIN TROMETHAMINE 3 G PO PACK: 3.0000 g | PACK | Freq: Once | ORAL | 0 refills | Status: AC

## 2016-07-23 NOTE — Telephone Encounter (Signed)
Called patient, verified DOB, communicated inconclusive urine culture result. Patient reported feeling the same since her visit. and has scheduled a follow up appointment with her PCP for further treatment.

## 2016-07-23 NOTE — Progress Notes (Signed)
Subjective:    Patient ID: Nicole Swanson, female    DOB: 03/17/1956, 60 y.o.   MRN: [id number]  CC: Nicole Swanson is a 60 y.o. female who presents today for an acute visit.    HPI: Patient here for acute visit chief complaint urinary frequency x 5 days. She was recently seen in urgent care and treated for UTI with Macrobid and Pyridium with no relief. Called today and told her culture was inconclusive. Endorses low back cramping, urinary frequency. No dysuria. No h/o renal stones  Had cystocele repair 10/17 and started on antibiotic 10/25, which resolved. Cannot remember name of antibiotic and I'm unable antibiotic in Care everywhere  Had hysterectomy, appendectomy. No h/o renal disease.    HISTORY:  Past Medical History:  Diagnosis Date  . Aborted cardiac arrest 1994  . AICD (automatic cardioverter/defibrillator) present   . Allergy   . Anginal pain ([place])   . Arthritis   . Asthma    hx of asthma related to air bag release   . Dual implantable cardiac defibrillator    Medtronic initially implanted in 1997  . DVT (deep vein thrombosis) in pregnancy ([place]) 1986   had heparin shots followed by coumadin  . Family history of seizures    Question neurological versus cardiac  . Heart murmur   . Irritable bowel syndrome   . 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 PONV (postoperative nausea and vomiting)   . Presence of permanent cardiac pacemaker   . PVC's (premature ventricular contractions)   . Raynaud's syndrome   . Shortness of breath dyspnea    with exertion   . Ventricular tachycardia [facility])    Past Surgical History:  Procedure Laterality Date  . ABDOMINAL HYSTERECTOMY    . APPENDECTOMY    . CARDIAC DEFIBRILLATOR PLACEMENT  1994   Dual chamber with pacemaker  . CARPAL TUNNEL RELEASE Right   . CYSTOCELE REPAIR N/A 06/19/2016   Procedure: ANTERIOR REPAIR (CYSTOCELE) WITH VAULT PROLAPSE;  Surgeon: Bjorn Loser, MD;  Location: [facility];  Service: Urology;   Laterality: N/A;  . CYSTOSCOPY N/A 06/19/2016   Procedure: CYSTOSCOPY;  Surgeon: Bjorn Loser, MD;  Location: [facility];  Service: Urology;  Laterality: N/A;  . LAPAROSCOPIC LYSIS OF ADHESIONS    . NASAL SEPTUM SURGERY  [address]   . OOPHORECTOMY    . PACEMAKER INSERTION     5 different devices   Family History  Problem Relation Age of Onset  . Heart disease Mother 25    A Fib, CHF  . Stroke Mother   . Heart disease Father   . Stroke Father   . Seizures Brother   . Seizures Daughter     Allergies: Vancomycin; Sulfa antibiotics; and Penicillins Current Outpatient Prescriptions on File Prior to Visit  Medication Sig Dispense Refill  . Calcium Carbonate-Vitamin D (CALCIUM-VITAMIN D) 500-200 MG-UNIT tablet Take 1 tablet by mouth daily.    Marland Kitchen dexlansoprazole (DEXILANT) 60 MG capsule Take 60 mg by mouth daily as needed (heartburn).    . docusate sodium (COLACE) 250 MG capsule Take 250 mg by mouth 2 (two) times daily.    . fexofenadine (ALLEGRA) 180 MG tablet Take 180 mg by mouth daily as needed for allergies or rhinitis.     . fluticasone (FLONASE) 50 MCG/ACT nasal spray USE 1 SPRAY IN EACH NOSTRIL DAILY 16 g 5  . HYDROcodone-acetaminophen (NORCO) 5-325 MG tablet Take 1-2 tablets by mouth every 6 (  six) hours as needed for moderate pain or severe pain. 30 tablet 0  . montelukast (SINGULAIR) 10 MG tablet TAKE 1 TABLET BY MOUTH AT BEDTIME. 90 tablet 1  . nitrofurantoin (MACRODANTIN) 100 MG capsule Take 1 capsule (100 mg total) by mouth 4 (four) times daily. 14 capsule 0  . phenazopyridine (PYRIDIUM) 200 MG tablet Take 1 tablet (200 mg total) by mouth 3 (three) times daily as needed for pain. 15 tablet 0  . senna (SENOKOT) 8.6 MG TABS tablet Take 1 tablet (8.6 mg total) by mouth daily. 120 each 0  . sotalol (BETAPACE) 120 MG tablet TAKE 1 TABLET BY MOUTH 2 TIMES DAILY. 180 tablet 2   Current Facility-Administered Medications on File Prior to Visit  Medication Dose Route  Frequency Provider Last Rate Last Dose  . triamcinolone acetonide (KENALOG) 10 MG/ML injection 10 mg  10 mg Other Once Landis Martins, DPM        Social History  Substance Use Topics  . Smoking status: Former Smoker    Types: Cigarettes    Quit date: 06/04/1995  . Smokeless tobacco: Never Used  . Alcohol use 0.0 oz/week     Comment: occasional    Review of Systems  Constitutional: Negative for chills and fever.  Respiratory: Negative for cough.   Cardiovascular: Negative for chest pain and palpitations.  Gastrointestinal: Negative for nausea and vomiting.  Genitourinary: Positive for frequency. Negative for dysuria, flank pain and hematuria.  Musculoskeletal: Positive for back pain.      Objective:    BP 130/72   Pulse 84   Temp 97.6 F (36.4 C) (Oral)   Ht 5\' 7"  (1.702 m)   Wt 142 lb 3.2 oz (64.5 kg)   SpO2 97%   BMI 22.27 kg/m    Physical Exam  Constitutional: She appears well-developed and well-nourished.  Cardiovascular: Normal rate, regular rhythm, normal heart sounds and normal pulses.   Pulmonary/Chest: Effort normal and breath sounds normal. She has no wheezes. She has no rhonchi. She has no rales.  Abdominal: There is no CVA tenderness.  Neurological: She is alert.  Skin: Skin is warm and dry.  Psychiatric: She has a normal mood and affect. Her speech is normal and behavior is normal. Thought content normal.  Vitals reviewed.      Assessment & Plan:  1. Acute cystitis with hematuria Afebrile. No kidney disease, DM, HTN. H/o PVCs and cardiac arrest. UA positive for leukocytes, nitrites, protein, ketones, and glucose. Patient has been taking Pyridium, Macrobid and I suspect Pyridium has distorted urine dip. Reviewed prior UA 11/18 which was negative for glucose; positive for ketones, protein. Due to allergies and patient being on antiarrhythmic, she is not a candidate for ciprofloxacin and other first line antibiotics. We jointly agreed to trial Monuril 1 time  dose. Patient will let us know how she's doing tomorrow and follow up next week with repeat UA.  - POCT Urinalysis Dipstick - fosfomycin (MONUROL) 3 g PACK; Take 3 g by mouth once.  Dispense: 1 packet; Refill: 0 - CULTURE, URINE COMPREHENSIVE       I am having Ms. Bonaventura start on fosfomycin. I am also having her maintain her docusate sodium, fexofenadine, fluticasone, montelukast, sotalol, dexlansoprazole, HYDROcodone-acetaminophen, senna, calcium-vitamin D, nitrofurantoin, and phenazopyridine. We will continue to administer triamcinolone acetonide.   Meds ordered this encounter  Medications  . fosfomycin (MONUROL) 3 g PACK    Sig: Take 3 g by mouth once.    Dispense:  1 packet  Refill:  0    Order Specific Question:   Supervising Provider    Answer:   [name] [[id number]]    Return precautions given.   Risks, benefits, and alternatives of the medications and treatment plan prescribed today were discussed, and patient expressed understanding.   Education regarding symptom management and diagnosis given to patient on AVS.  Continue to follow with TULLO, Aris Everts, MD for routine health maintenance.   Nicole Swanson and I agreed with plan.   Mable Paris, FNP

## 2016-07-23 NOTE — Patient Instructions (Addendum)
Drink plenty of water and take antibiotic as prescribed.   F/u one week to repeat UA.   Let us know if you are not better on monurol.    We are pending the urine culture to know the organism causing the infection and our office will call you with results. If the particular organism requires a different antibiotic than the on prescribed, we will place an order for a new prescription at that time.   If you symptoms worsen or you have new symptoms, please contact our office, or return to clinic for re evaluation.  Urinary Tract Infection Urinary tract infections (UTIs) can develop anywhere along your urinary tract. Your urinary tract is your body's drainage system for removing wastes and extra water. Your urinary tract includes two kidneys, two ureters, a bladder, and a urethra. Your kidneys are a pair of bean-shaped organs. Each kidney is about the size of your fist. They are located below your ribs, one on each side of your spine. CAUSES Infections are caused by microbes, which are microscopic organisms, including fungi, viruses, and bacteria. These organisms are so small that they can only be seen through a microscope. Bacteria are the microbes that most commonly cause UTIs. SYMPTOMS  Symptoms of UTIs may vary by age and gender of the patient and by the location of the infection. Symptoms in young women typically include a frequent and intense urge to urinate and a painful, burning feeling in the bladder or urethra during urination. Older women and men are more likely to be tired, shaky, and weak and have muscle aches and abdominal pain. A fever may mean the infection is in your kidneys. Other symptoms of a kidney infection include pain in your back or sides below the ribs, nausea, and vomiting. DIAGNOSIS To diagnose a UTI, your caregiver will ask you about your symptoms. Your caregiver will also ask you to provide a urine sample. The urine sample will be tested for bacteria and white blood cells.  White blood cells are made by your body to help fight infection. TREATMENT  Typically, UTIs can be treated with medication. Because most UTIs are caused by a bacterial infection, they usually can be treated with the use of antibiotics. The choice of antibiotic and length of treatment depend on your symptoms and the type of bacteria causing your infection. HOME CARE INSTRUCTIONS  If you were prescribed antibiotics, take them exactly as your caregiver instructs you. Finish the medication even if you feel better after you have only taken some of the medication.  Drink enough water and fluids to keep your urine clear or pale yellow.  Avoid caffeine, tea, and carbonated beverages. They tend to irritate your bladder.  Empty your bladder often. Avoid holding urine for long periods of time.  Empty your bladder before and after sexual intercourse.  After a bowel movement, women should cleanse from front to back. Use each tissue only once. SEEK MEDICAL CARE IF:   You have back pain.  You develop a fever.  Your symptoms do not begin to resolve within 3 days. SEEK IMMEDIATE MEDICAL CARE IF:   You have severe back pain or lower abdominal pain.  You develop chills.  You have nausea or vomiting.  You have continued burning or discomfort with urination. MAKE SURE YOU:   Understand these instructions.  Will watch your condition.  Will get help right away if you are not doing well or get worse.   This information is not intended to replace advice  given to you by your health care provider. Make sure you discuss any questions you have with your health care provider.   Document Released: 05/30/2005 Document Revised: 05/11/2015 Document Reviewed: 09/28/2011 Elsevier Interactive Patient Education [employer].

## 2016-07-23 NOTE — Progress Notes (Signed)
Pre visit review using our clinic review tool, if applicable. No additional management support is needed unless otherwise documented below in the visit note. 

## 2016-07-25 ENCOUNTER — Telehealth: Payer: Self-pay

## 2016-07-25 DIAGNOSIS — N3001 Acute cystitis with hematuria: Secondary | ICD-10-CM

## 2016-07-25 NOTE — Telephone Encounter (Signed)
Pt bringing urine for urine culture on 07/30/16. Previous order was not changed to future. Have changed order to future so it will be visible on 11/27.

## 2016-07-28 NOTE — Progress Notes (Signed)
Patient Care Team: [name], MD as PCP - General (Internal Medicine)   HPI  Nicole Swanson is a 60 y.o. female Seen in followup for ICD implanted for aborted cardiac arrest. Her care initially had been at [facility]. She is status post dual chamber ICD implantation and generator replacement was recently done by me summer 2013   Echocardiogram 2011 demonstrated normal left ventricular function valve function and dimension sizes   She also has a history of ventricular ectopy for which she has been on sotalol in which Dr. Sharon Seller was going to increase prior to her transferring her care to [facility]   Her husband died 72 .    She denies problems with exercise tolerance at this point; at her last visit we activated/augmented rate response on her device  There is no edema or chest pain. She has stable sob  Biggest problem of late has been recurrent UTIs following bladder surgery   Date Cr Mg K  4/15  0.9 2.4    10/15  0.9   4.5  11/17 0.7  3.9   Device History: MDT dual chamber ICD implanted 1997 for aborted cardiac arrest; gen change 2007; gen change 2013 History of appropriate therapy: Yes History of AAD therapy: Yes - sotalol  Last laboratories were magnesium  4/15-2.4 and potassium 10/15  -4.5 and 0.9 Cr   Past Medical History:  Diagnosis Date  . Aborted cardiac arrest 1994  . AICD (automatic cardioverter/defibrillator) present   . Allergy   . Anginal pain ([place])   . Arthritis   . Asthma    hx of asthma related to air bag release   . Dual implantable cardiac defibrillator    Medtronic initially implanted in 1997  . DVT (deep vein thrombosis) in pregnancy ([place]) 1986   had heparin shots followed by coumadin  . Family history of seizures    Question neurological versus cardiac  . Heart murmur   . Irritable bowel syndrome   . 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 PONV (postoperative nausea and vomiting)   . Presence of permanent cardiac  pacemaker   . PVC's (premature ventricular contractions)   . Raynaud's syndrome   . Shortness of breath dyspnea    with exertion   . Ventricular tachycardia [facility])     Past Surgical History:  Procedure Laterality Date  . ABDOMINAL HYSTERECTOMY    . APPENDECTOMY    . CARDIAC DEFIBRILLATOR PLACEMENT  1994   Dual chamber with pacemaker  . CARPAL TUNNEL RELEASE Right   . CYSTOCELE REPAIR N/A 06/19/2016   Procedure: ANTERIOR REPAIR (CYSTOCELE) WITH VAULT PROLAPSE;  Surgeon: Bjorn Loser, MD;  Location: [facility];  Service: Urology;  Laterality: N/A;  . CYSTOSCOPY N/A 06/19/2016   Procedure: CYSTOSCOPY;  Surgeon: Bjorn Loser, MD;  Location: [facility];  Service: Urology;  Laterality: N/A;  . LAPAROSCOPIC LYSIS OF ADHESIONS    . NASAL SEPTUM SURGERY  [address]   . OOPHORECTOMY    . PACEMAKER INSERTION     5 different devices    Current Outpatient Prescriptions  Medication Sig Dispense Refill  . Calcium Carbonate-Vitamin D (CALCIUM-VITAMIN D) 500-200 MG-UNIT tablet Take 1 tablet by mouth daily.    Marland Kitchen dexlansoprazole (DEXILANT) 60 MG capsule Take 60 mg by mouth daily as needed (heartburn).    . docusate sodium (COLACE) 250 MG capsule Take 250 mg by mouth 2 (two) times daily.    . fexofenadine (  ALLEGRA) 180 MG tablet Take 180 mg by mouth daily as needed for allergies or rhinitis.     . fluticasone (FLONASE) 50 MCG/ACT nasal spray USE 1 SPRAY IN EACH NOSTRIL DAILY 16 g 5  . montelukast (SINGULAIR) 10 MG tablet TAKE 1 TABLET BY MOUTH AT BEDTIME. 90 tablet 1  . phenazopyridine (PYRIDIUM) 200 MG tablet Take 1 tablet (200 mg total) by mouth 3 (three) times daily as needed for pain. 15 tablet 0  . senna (SENOKOT) 8.6 MG TABS tablet Take 1 tablet (8.6 mg total) by mouth daily. 120 each 0  . sotalol (BETAPACE) 120 MG tablet TAKE 1 TABLET BY MOUTH 2 TIMES DAILY. 180 tablet 2   Current Facility-Administered Medications  Medication Dose Route Frequency Provider Last Rate Last Dose  .  triamcinolone acetonide (KENALOG) 10 MG/ML injection 10 mg  10 mg Other Once Landis Martins, DPM        Allergies  Allergen Reactions  . Vancomycin Rash    Other Reaction: RASH & WELTS  . Sulfa Antibiotics Diarrhea    gastritis  . Penicillins Rash    Has patient had a PCN reaction causing immediate rash, facial/tongue/throat swelling, SOB or lightheadedness with hypotension: No Has patient had a PCN reaction causing severe rash involving mucus membranes or skin necrosis: No Has patient had a PCN reaction that required hospitalization: No Has patient had a PCN reaction occurring within the last 10 years: No If all of the above answers are "NO", then may proceed with Cephalosporin use.     Review of Systems negative except from HPI and PMH  Physical Exam BP 94/70   Pulse 87   Ht 5\' 7"  (1.702 m)   Wt 144 lb (65.3 kg)   SpO2 98%   BMI 22.55 kg/m  Well developed and well nourished in no acute distress HENT normal E scleral and icterus clear Neck Supple JVP flat; carotids brisk and full Clear to ausculation Device pocket well healed; without hematoma or erythema.  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  The information was reviewed. No changes were made in the programming.    No intercurrent Ventricular tachycardia

## 2016-07-30 ENCOUNTER — Other Ambulatory Visit: Payer: 59

## 2016-07-30 DIAGNOSIS — N3001 Acute cystitis with hematuria: Secondary | ICD-10-CM | POA: Diagnosis not present

## 2016-07-31 ENCOUNTER — Encounter: Payer: Self-pay | Admitting: Internal Medicine

## 2016-07-31 ENCOUNTER — Ambulatory Visit (INDEPENDENT_AMBULATORY_CARE_PROVIDER_SITE_OTHER): Payer: 59 | Admitting: Internal Medicine

## 2016-07-31 VITALS — BP 94/70 | HR 87 | Ht 67.0 in | Wt 144.0 lb

## 2016-07-31 DIAGNOSIS — I493 Ventricular premature depolarization: Secondary | ICD-10-CM

## 2016-07-31 DIAGNOSIS — Z79899 Other long term (current) drug therapy: Secondary | ICD-10-CM

## 2016-07-31 DIAGNOSIS — I469 Cardiac arrest, cause unspecified: Secondary | ICD-10-CM | POA: Diagnosis not present

## 2016-07-31 DIAGNOSIS — Z95810 Presence of automatic (implantable) cardiac defibrillator: Secondary | ICD-10-CM

## 2016-07-31 LAB — URINE CULTURE: ORGANISM ID, BACTERIA: NO GROWTH

## 2016-07-31 NOTE — Patient Instructions (Signed)
Medication Instructions: - Your physician recommends that you continue on your current medications as directed. Please refer to the Current Medication list given to you today.   Labwork: - none ordered  Procedures/Testing: - none ordered  Follow-Up: - Remote monitoring is used to monitor your Pacemaker of ICD from home. This monitoring reduces the number of office visits required to check your device to one time per year. It allows [place] to keep an eye on the functioning of your device to ensure it is working properly. You are scheduled for a device check from home on 10/30/16. You may send your transmission at any time that day. If you have a wireless device, the transmission will be sent automatically. After your physician reviews your transmission, you will receive a postcard with your next transmission date.  - Your physician wants you to follow-up in: 6 months with Dr. [name]. You will receive a reminder letter in the mail two months in advance. If you don't receive a letter, please call our office to schedule the follow-up appointment.  Any Additional Special Instructions Will Be Listed Below (If Applicable).     If you need a refill on your cardiac medications before your next appointment, please call your pharmacy.

## 2016-08-01 ENCOUNTER — Telehealth: Payer: Self-pay | Admitting: Internal Medicine

## 2016-08-01 ENCOUNTER — Other Ambulatory Visit: Payer: Self-pay | Admitting: Family

## 2016-08-01 DIAGNOSIS — R319 Hematuria, unspecified: Secondary | ICD-10-CM

## 2016-08-01 NOTE — Telephone Encounter (Signed)
Pt called requesting results. Thank you!  Call pt @ [phone]

## 2016-08-01 NOTE — Telephone Encounter (Signed)
Patient has been informed of results and has scheduled appointment for lab visit.

## 2016-08-06 ENCOUNTER — Other Ambulatory Visit (INDEPENDENT_AMBULATORY_CARE_PROVIDER_SITE_OTHER): Payer: 59

## 2016-08-06 DIAGNOSIS — R319 Hematuria, unspecified: Secondary | ICD-10-CM

## 2016-08-06 LAB — URINALYSIS
Bilirubin Urine: NEGATIVE
Hgb urine dipstick: NEGATIVE
KETONES UR: NEGATIVE
Leukocytes, UA: NEGATIVE
Nitrite: NEGATIVE
SPECIFIC GRAVITY, URINE: 1.01 (ref 1.000–1.030)
Total Protein, Urine: NEGATIVE
URINE GLUCOSE: NEGATIVE
UROBILINOGEN UA: 0.2 (ref 0.0–1.0)
pH: 7.5 (ref 5.0–8.0)

## 2016-08-13 ENCOUNTER — Other Ambulatory Visit: Payer: Self-pay | Admitting: Internal Medicine

## 2016-08-14 ENCOUNTER — Other Ambulatory Visit: Payer: Self-pay | Admitting: Internal Medicine

## 2016-08-21 LAB — CUP PACEART INCLINIC DEVICE CHECK
Battery Voltage: 3.05 V
Brady Statistic AP VP Percent: 0.01 %
Brady Statistic AS VP Percent: 0.03 %
Brady Statistic RA Percent Paced: 63 %
Date Time Interrogation Session: 20171128155729
HIGH POWER IMPEDANCE MEASURED VALUE: 456 Ohm
HIGH POWER IMPEDANCE MEASURED VALUE: 50 Ohm
HIGH POWER IMPEDANCE MEASURED VALUE: 75 Ohm
Implantable Lead Location: 753860
Implantable Lead Model: 6942
Implantable Pulse Generator Implant Date: 20130722
Lead Channel Pacing Threshold Amplitude: 0.5 V
Lead Channel Pacing Threshold Amplitude: 1 V
Lead Channel Pacing Threshold Pulse Width: 0.4 ms
Lead Channel Sensing Intrinsic Amplitude: 18.375 mV
Lead Channel Setting Sensing Sensitivity: 0.45 mV
MDC IDC LEAD IMPLANT DT: 19970917
MDC IDC LEAD IMPLANT DT: 20070119
MDC IDC LEAD LOCATION: 753859
MDC IDC MSMT LEADCHNL RA IMPEDANCE VALUE: 456 Ohm
MDC IDC MSMT LEADCHNL RA SENSING INTR AMPL: 2.375 mV
MDC IDC MSMT LEADCHNL RV IMPEDANCE VALUE: 456 Ohm
MDC IDC MSMT LEADCHNL RV PACING THRESHOLD PULSEWIDTH: 0.4 ms
MDC IDC SET LEADCHNL RA PACING AMPLITUDE: 2 V
MDC IDC SET LEADCHNL RV PACING AMPLITUDE: 2.5 V
MDC IDC SET LEADCHNL RV PACING PULSEWIDTH: 0.4 ms
MDC IDC STAT BRADY AP VS PERCENT: 62.99 %
MDC IDC STAT BRADY AS VS PERCENT: 36.96 %
MDC IDC STAT BRADY RV PERCENT PACED: 0.05 %

## 2016-09-04 ENCOUNTER — Encounter: Payer: Self-pay | Admitting: Urology

## 2016-09-04 ENCOUNTER — Ambulatory Visit (INDEPENDENT_AMBULATORY_CARE_PROVIDER_SITE_OTHER): Payer: 59 | Admitting: Urology

## 2016-09-04 VITALS — BP 124/82 | HR 74 | Ht 67.0 in | Wt 143.6 lb

## 2016-09-04 DIAGNOSIS — N3946 Mixed incontinence: Secondary | ICD-10-CM

## 2016-09-04 NOTE — Progress Notes (Signed)
09/04/2016 9:32 AM   Nicole Swanson [birth year] [id number]  Referring provider: [name], MD [address] [place], [place] [address]  Chief Complaint  Patient presents with  . Routine Post Op    HPI: The patient had a cystocele repair with graft and vault prolapse repair on June 19 2016. She had minimal leakage post operatively. On her first examination post operatively she had excellent length and very good support anteriorly. Intraoperatively she had a short anterior vaginal wall noted. I felt that she may have lost approximately 1 cm of length but was doing well relative to the intraoperative findings. Preoperatively she had mild mixed incontinence and we had decided not to perform a sling prior to surgery  The patient post surgery had a urinary tract infection that required a total of 3 courses of an antibiotic to clear. She reports that she no longer has an infection and is back to baseline. Her incontinence is much better and she can leak a little bit when she sneezes.       PMH: Past Medical History:  Diagnosis Date  . Aborted cardiac arrest 1994  . AICD (automatic cardioverter/defibrillator) present   . Allergy   . Anginal pain ([place])   . Arthritis   . Asthma    hx of asthma related to air bag release   . Dual implantable cardiac defibrillator    Medtronic initially implanted in 1997  . DVT (deep vein thrombosis) in pregnancy ([place]) 1986   had heparin shots followed by coumadin  . Family history of seizures    Question neurological versus cardiac  . Heart murmur   . Irritable bowel syndrome   . 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 PONV (postoperative nausea and vomiting)   . Presence of permanent cardiac pacemaker   . PVC's (premature ventricular contractions)   . Raynaud's syndrome   . Shortness of breath dyspnea    with exertion   . Ventricular tachycardia [facility])     Surgical History: Past Surgical History:  Procedure  Laterality Date  . ABDOMINAL HYSTERECTOMY    . APPENDECTOMY    . CARDIAC DEFIBRILLATOR PLACEMENT  1994   Dual chamber with pacemaker  . CARPAL TUNNEL RELEASE Right   . CYSTOCELE REPAIR N/A 06/19/2016   Procedure: ANTERIOR REPAIR (CYSTOCELE) WITH VAULT PROLAPSE;  Surgeon: Bjorn Loser, MD;  Location: [facility];  Service: Urology;  Laterality: N/A;  . CYSTOSCOPY N/A 06/19/2016   Procedure: CYSTOSCOPY;  Surgeon: Bjorn Loser, MD;  Location: [facility];  Service: Urology;  Laterality: N/A;  . LAPAROSCOPIC LYSIS OF ADHESIONS    . NASAL SEPTUM SURGERY  [address]   . OOPHORECTOMY    . PACEMAKER INSERTION     5 different devices    Home Medications:  Allergies as of 09/04/2016      Reactions   Vancomycin Rash   Other Reaction: RASH & WELTS   Sulfa Antibiotics Diarrhea   gastritis   Penicillins Rash   Has patient had a PCN reaction causing immediate rash, facial/tongue/throat swelling, SOB or lightheadedness with hypotension: No Has patient had a PCN reaction causing severe rash involving mucus membranes or skin necrosis: No Has patient had a PCN reaction that required hospitalization: No Has patient had a PCN reaction occurring within the last 10 years: No If all of the above answers are "NO", then may proceed with Cephalosporin use.      Medication List  Accurate as of 09/04/16  9:32 AM. Always use your most recent med list.          calcium-vitamin D 500-200 MG-UNIT tablet Take 1 tablet by mouth daily.   Cranberry 1000 MG Caps Take by mouth.   DEXILANT 60 MG capsule Generic drug:  dexlansoprazole Take 60 mg by mouth daily as needed (heartburn).   DEXILANT 60 MG capsule Generic drug:  dexlansoprazole TAKE 1 CAPSULE BY MOUTH DAILY.   docusate sodium 250 MG capsule Commonly known as:  COLACE Take 250 mg by mouth 2 (two) times daily.   fexofenadine 180 MG tablet Commonly known as:  ALLEGRA Take 180 mg by mouth daily as needed for allergies or  rhinitis.   fluticasone 50 MCG/ACT nasal spray Commonly known as:  FLONASE USE 1 SPRAY IN EACH NOSTRIL DAILY   montelukast 10 MG tablet Commonly known as:  SINGULAIR TAKE 1 TABLET BY MOUTH AT BEDTIME.   phenazopyridine 200 MG tablet Commonly known as:  PYRIDIUM Take 1 tablet (200 mg total) by mouth 3 (three) times daily as needed for pain.   senna 8.6 MG Tabs tablet Commonly known as:  SENOKOT Take 1 tablet (8.6 mg total) by mouth daily.   sotalol 120 MG tablet Commonly known as:  BETAPACE TAKE 1 TABLET BY MOUTH 2 TIMES DAILY.       Allergies:  Allergies  Allergen Reactions  . Vancomycin Rash    Other Reaction: RASH & WELTS  . Sulfa Antibiotics Diarrhea    gastritis  . Penicillins Rash    Has patient had a PCN reaction causing immediate rash, facial/tongue/throat swelling, SOB or lightheadedness with hypotension: No Has patient had a PCN reaction causing severe rash involving mucus membranes or skin necrosis: No Has patient had a PCN reaction that required hospitalization: No Has patient had a PCN reaction occurring within the last 10 years: No If all of the above answers are "NO", then may proceed with Cephalosporin use.     Family History: Family History  Problem Relation Age of Onset  . Heart disease Mother 36    A Fib, CHF  . Stroke Mother   . Heart disease Father   . Stroke Father   . Seizures Brother   . Seizures Daughter     Social History:  reports that she quit smoking about 21 years ago. Her smoking use included Cigarettes. She has never used smokeless tobacco. She reports that she drinks alcohol. She reports that she does not use drugs.  ROS: UROLOGY Frequent Urination?: No Hard to postpone urination?: No Burning/pain with urination?: No Get up at night to urinate?: No Leakage of urine?: Yes Urine stream starts and stops?: No Trouble starting stream?: No Do you have to strain to urinate?: No Blood in urine?: No Urinary tract infection?:  No Sexually transmitted disease?: No Injury to kidneys or bladder?: No Painful intercourse?: No Weak stream?: No Currently pregnant?: No Vaginal bleeding?: No Last menstrual period?: n  Gastrointestinal Nausea?: No Vomiting?: No Indigestion/heartburn?: No Diarrhea?: No Constipation?: No  Constitutional Fever: No Night sweats?: No Weight loss?: No Fatigue?: No  Skin Skin rash/lesions?: No Itching?: No  Eyes Blurred vision?: No Double vision?: No  Ears/Nose/Throat Sore throat?: No Sinus problems?: No  Hematologic/Lymphatic Swollen glands?: No Easy bruising?: No  Cardiovascular Leg swelling?: No Chest pain?: No  Respiratory Cough?: No Shortness of breath?: No  Endocrine Excessive thirst?: No  Musculoskeletal Back pain?: No Joint pain?: No  Neurological Headaches?: No Dizziness?: No  Psychologic  Depression?: No Anxiety?: No  Physical Exam: BP 124/82 (BP Location: Left Arm, Patient Position: Sitting, Cuff Size: Normal)   Pulse 74   Ht 5\' 7"  (1.702 m)   Wt 143 lb 9.6 oz (65.1 kg)   BMI 22.49 kg/m    GU: No CVA tenderness. On pelvic examination the patient had good vaginal length. She had excellent support anteriorly. There was some mild redness along the suture line area that had epithelialized. There was no true granulation tissue. There was approximately 1 mm of suture material noted in the midline.   Laboratory Data: Lab Results  Component Value Date   WBC 4.3 06/19/2016   HGB 10.2 (L) 06/20/2016   HCT 31.9 (L) 06/20/2016   MCV 86.4 06/19/2016   PLT 141 (L) 06/19/2016    Lab Results  Component Value Date   CREATININE 0.70 06/20/2016    No results found for: PSA  No results found for: TESTOSTERONE  No results found for: HGBA1C  Urinalysis    Component Value Date/Time   COLORURINE YELLOW 08/06/2016 1119   APPEARANCEUR CLEAR 08/06/2016 1119   APPEARANCEUR Clear 09/16/2015 1149   LABSPEC 1.010 08/06/2016 1119   PHURINE 7.5  08/06/2016 1119   GLUCOSEU NEGATIVE 08/06/2016 1119   HGBUR NEGATIVE 08/06/2016 1119   BILIRUBINUR NEGATIVE 08/06/2016 1119   BILIRUBINUR moderate 07/23/2016 1821   BILIRUBINUR Negative 09/16/2015 [place] 08/06/2016 1119   PROTEINUR 100mg /dL 07/23/2016 1821   PROTEINUR 30 (A) 07/21/2016 1437   UROBILINOGEN 0.2 08/06/2016 1119   NITRITE NEGATIVE 08/06/2016 1119   LEUKOCYTESUR NEGATIVE 08/06/2016 1119   LEUKOCYTESUR 1+ (A) 09/16/2015 1149    Pertinent Imaging: none  Assessment & Plan:  The patient has done very well from prolapse surgery. She now has very mild stress incontinence. A picture was drawn regarding the 1 mm suture that hopefully will dissolve or remaining asymptomatic. She will be reassessed when necessary. I was very pleased for her postoperative course  1. Cystocele 2. Stress incontinence  There are no diagnoses linked to this encounter.  No Follow-up on file.  Reece Packer, MD  [facility] [address] [place], [place] [address] [phone]

## 2016-10-15 DIAGNOSIS — H905 Unspecified sensorineural hearing loss: Secondary | ICD-10-CM | POA: Diagnosis not present

## 2016-10-19 DIAGNOSIS — K581 Irritable bowel syndrome with constipation: Secondary | ICD-10-CM | POA: Diagnosis not present

## 2016-10-19 DIAGNOSIS — Z1211 Encounter for screening for malignant neoplasm of colon: Secondary | ICD-10-CM | POA: Diagnosis not present

## 2016-10-30 ENCOUNTER — Ambulatory Visit (INDEPENDENT_AMBULATORY_CARE_PROVIDER_SITE_OTHER): Payer: 59 | Admitting: *Deleted

## 2016-10-30 ENCOUNTER — Emergency Department
Admission: EM | Admit: 2016-10-30 | Discharge: 2016-10-30 | Disposition: A | Discharge status: Home / Self Care | Payer: 59 | Attending: Emergency Medicine | Admitting: Emergency Medicine

## 2016-10-30 DIAGNOSIS — K2900 Acute gastritis without bleeding: Secondary | ICD-10-CM | POA: Insufficient documentation

## 2016-10-30 DIAGNOSIS — J45909 Unspecified asthma, uncomplicated: Secondary | ICD-10-CM | POA: Diagnosis not present

## 2016-10-30 DIAGNOSIS — Z950 Presence of cardiac pacemaker: Secondary | ICD-10-CM | POA: Diagnosis not present

## 2016-10-30 DIAGNOSIS — R112 Nausea with vomiting, unspecified: Secondary | ICD-10-CM | POA: Diagnosis not present

## 2016-10-30 DIAGNOSIS — I469 Cardiac arrest, cause unspecified: Secondary | ICD-10-CM

## 2016-10-30 DIAGNOSIS — Z87891 Personal history of nicotine dependence: Secondary | ICD-10-CM | POA: Diagnosis not present

## 2016-10-30 DIAGNOSIS — R1010 Upper abdominal pain, unspecified: Secondary | ICD-10-CM | POA: Diagnosis not present

## 2016-10-30 LAB — COMPREHENSIVE METABOLIC PANEL
ALBUMIN: 4.7 g/dL (ref 3.5–5.0)
ALT: 14 U/L (ref 14–54)
ANION GAP: 8 (ref 5–15)
AST: 24 U/L (ref 15–41)
Alkaline Phosphatase: 70 U/L (ref 38–126)
BILIRUBIN TOTAL: 1.2 mg/dL (ref 0.3–1.2)
BUN: 19 mg/dL (ref 6–20)
CO2: 26 mmol/L (ref 22–32)
Calcium: 9.7 mg/dL (ref 8.9–10.3)
Chloride: 102 mmol/L (ref 101–111)
Creatinine, Ser: 0.64 mg/dL (ref 0.44–1.00)
GFR calc Af Amer: >60 mL/min (ref >60)
GFR calc non Af Amer: >60 mL/min (ref >60)
Glucose, Bld: 112 mg/dL — ABNORMAL HIGH (ref 65–99)
POTASSIUM: 4 mmol/L (ref 3.5–5.1)
SODIUM: 136 mmol/L (ref 135–145)
Total Protein: 8.1 g/dL (ref 6.5–8.1)

## 2016-10-30 LAB — CBC
HEMATOCRIT: 41.9 % (ref 35.0–47.0)
HEMOGLOBIN: 14.4 g/dL (ref 12.0–16.0)
MCH: 29.9 pg (ref 26.0–34.0)
MCHC: 34.2 g/dL (ref 32.0–36.0)
MCV: 87.2 fL (ref 80.0–100.0)
Platelets: 182 10*3/uL (ref 150–440)
RBC: 4.81 MIL/uL (ref 3.80–5.20)
RDW: 13.4 % (ref 11.5–14.5)
WBC: 3.1 10*3/uL — ABNORMAL LOW (ref 3.6–11.0)

## 2016-10-30 LAB — URINALYSIS, COMPLETE (UACMP) WITH MICROSCOPIC
BACTERIA UA: NONE SEEN
Bilirubin Urine: NEGATIVE
Glucose, UA: NEGATIVE mg/dL
Hgb urine dipstick: NEGATIVE
Ketones, ur: 20 mg/dL — AB
Nitrite: NEGATIVE
PROTEIN: 30 mg/dL — AB
SPECIFIC GRAVITY, URINE: 1.027 (ref 1.005–1.030)
pH: 5 (ref 5.0–8.0)

## 2016-10-30 LAB — LIPASE, BLOOD: Lipase: 27 U/L (ref 11–51)

## 2016-10-30 MED ORDER — FAMOTIDINE 20 MG PO TABS: 20.0000 mg | ORAL_TABLET | Freq: Two times a day (BID) | ORAL | 0 refills | Status: DC

## 2016-10-30 MED ORDER — DICYCLOMINE HCL 20 MG PO TABS: 20.0000 mg | ORAL_TABLET | Freq: Three times a day (TID) | ORAL | 0 refills | Status: DC | PRN

## 2016-10-30 MED ORDER — FAMOTIDINE 20 MG PO TABS
40.0000 mg | ORAL_TABLET | Freq: Once | ORAL | Status: AC
  Administered 2016-10-30: 40 mg via ORAL
  Filled 2016-10-30: qty 2

## 2016-10-30 MED ORDER — ALUM & MAG HYDROXIDE-SIMETH 200-200-20 MG/5ML PO SUSP
30.0000 mL | Freq: Once | ORAL | Status: AC
  Administered 2016-10-30: 30 mL via ORAL
  Filled 2016-10-30 (×2): qty 30

## 2016-10-30 MED ORDER — ONDANSETRON 4 MG PO TBDP: 4.0000 mg | ORAL_TABLET | Freq: Three times a day (TID) | ORAL | 0 refills | Status: DC | PRN

## 2016-10-30 MED ORDER — ONDANSETRON 4 MG PO TBDP
8.0000 mg | ORAL_TABLET | Freq: Once | ORAL | Status: AC
  Administered 2016-10-30: 8 mg via ORAL
  Filled 2016-10-30: qty 2

## 2016-10-30 NOTE — Progress Notes (Signed)
Remote ICD transmission.   

## 2016-10-30 NOTE — ED Notes (Signed)
Pt report severe abd pain with nausea/vomiting since 1am Monday - denies diarrhea - last vomited at 1230am today

## 2016-10-30 NOTE — ED Triage Notes (Signed)
Pt c/o mid abd cramping with N/V since Monday afternoon. Denies diarrhea. Last BM yesterday.

## 2016-10-30 NOTE — ED Provider Notes (Signed)
[facility] Emergency Department Provider Note  ____________________________________________  Time seen: Approximately 4:38 PM  I have reviewed the triage vital signs and the nursing notes.   HISTORY  Chief Complaint Abdominal Pain    HPI Nicole Swanson is a 61 y.o. female who complains of upper abdominal pain with nausea vomiting since about 1 AM yesterday. No diarrhea. Pain is colicky waxing and waning, moderate intensity, aching. No chest pain shortness of breath. No fevers or chills. Had a normal bowel movement yesterday. No body aches. No dysuria frequency urgency dizziness or syncope. Has not been able to eat over the past 24 hours. She does not have any significant postprandial pain.    Past Medical History:  Diagnosis Date  . Aborted cardiac arrest 1994  . AICD (automatic cardioverter/defibrillator) present   . Allergy   . Anginal pain ([place])   . Arthritis   . Asthma    hx of asthma related to air bag release   . Dual implantable cardiac defibrillator    Medtronic initially implanted in 1997  . DVT (deep vein thrombosis) in pregnancy ([place]) 1986   had heparin shots followed by coumadin  . Family history of seizures    Question neurological versus cardiac  . Heart murmur   . Irritable bowel syndrome   . 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 PONV (postoperative nausea and vomiting)   . Presence of permanent cardiac pacemaker   . PVC's (premature ventricular contractions)   . Raynaud's syndrome   . Shortness of breath dyspnea    with exertion   . Ventricular tachycardia [facility])      Patient Active Problem List   Diagnosis Date Noted  . Cystocele, midline 06/19/2016  . Cardiac arrest ([place]) 05/14/2016  . Ventricular premature depolarization 05/14/2016  . Non-dose-related adverse reaction to medication 03/13/2016  . Urinary frequency 09/16/2015  . Bladder prolapse, female, acquired 09/08/2015  . Other intervertebral disc  degeneration, lumbar region 01/25/2015  . Sacroiliac joint dysfunction of both sides 01/25/2015  . Spinal stenosis, lumbar region, with neurogenic claudication 01/25/2015  . Facet syndrome, lumbar 01/25/2015  . Lumbar radiculopathy 01/25/2015  . Calculus of gallbladder without cholecystitis without obstruction 12/23/2014  . Polyarthritis 03/16/2014  . Thrombocytopenia ([place]) 09/11/2013  . Other malaise 09/10/2013  . Major depressive disorder, single episode 09/10/2013  . Raynaud phenomenon 04/17/2013  . Raynaud's syndrome without gangrene 04/17/2013  . Low back pain 12/05/2012  . Shortness of breath 06/30/2012  . Aborted cardiac arrest   . Presence of automatic implantable cardioverter-defibrillator   . PVC's / ventricular tachycardia   . 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 Irritable bowel syndrome with constipation      Past Surgical History:  Procedure Laterality Date  . ABDOMINAL HYSTERECTOMY    . APPENDECTOMY    . CARDIAC DEFIBRILLATOR PLACEMENT  1994   Dual chamber with pacemaker  . CARPAL TUNNEL RELEASE Right   . CYSTOCELE REPAIR N/A 06/19/2016   Procedure: ANTERIOR REPAIR (CYSTOCELE) WITH VAULT PROLAPSE;  Surgeon: Bjorn Loser, MD;  Location: [facility] ORS;  Service: Urology;  Laterality: N/A;  . CYSTOSCOPY N/A 06/19/2016   Procedure: CYSTOSCOPY;  Surgeon: Bjorn Loser, MD;  Location: [facility] ORS;  Service: Urology;  Laterality: N/A;  . LAPAROSCOPIC LYSIS OF ADHESIONS    . NASAL SEPTUM SURGERY  [address]   . OOPHORECTOMY    . PACEMAKER INSERTION     5 different devices  Prior to Admission medications   Medication Sig Start Date End Date Taking? Authorizing Provider  Calcium Carbonate-Vitamin D (CALCIUM-VITAMIN D) 500-200 MG-UNIT tablet Take 1 tablet by mouth daily.    Historical Provider, MD  Cranberry 1000 MG CAPS Take by mouth.    Historical Provider, MD  DEXILANT 60 MG capsule TAKE 1 CAPSULE BY MOUTH DAILY. Patient not taking:  Reported on 09/04/2016 08/14/16   [name], MD  dexlansoprazole (DEXILANT) 60 MG capsule Take 60 mg by mouth daily as needed (heartburn).    Historical Provider, MD  dicyclomine (BENTYL) 20 MG tablet Take 1 tablet (20 mg total) by mouth 3 (three) times daily as needed for spasms. 10/30/16   Carrie Mew, MD  docusate sodium (COLACE) 250 MG capsule Take 250 mg by mouth 2 (two) times daily.    Historical Provider, MD  famotidine (PEPCID) 20 MG tablet Take 1 tablet (20 mg total) by mouth 2 (two) times daily. 10/30/16   Carrie Mew, MD  fexofenadine (ALLEGRA) 180 MG tablet Take 180 mg by mouth daily as needed for allergies or rhinitis.     Historical Provider, MD  fluticasone (FLONASE) 50 MCG/ACT nasal spray USE 1 SPRAY IN EACH NOSTRIL DAILY 08/14/16   [name], MD  montelukast (SINGULAIR) 10 MG tablet TAKE 1 TABLET BY MOUTH AT BEDTIME. 08/14/16   [name], MD  ondansetron (ZOFRAN ODT) 4 MG disintegrating tablet Take 1 tablet (4 mg total) by mouth every 8 (eight) hours as needed for nausea or vomiting. 10/30/16   Carrie Mew, MD  phenazopyridine (PYRIDIUM) 200 MG tablet Take 1 tablet (200 mg total) by mouth 3 (three) times daily as needed for pain. Patient not taking: Reported on 09/04/2016 07/21/16   Frederich Cha, MD  senna (SENOKOT) 8.6 MG TABS tablet Take 1 tablet (8.6 mg total) by mouth daily. 06/20/16   Bjorn Loser, MD  sotalol (BETAPACE) 120 MG tablet TAKE 1 TABLET BY MOUTH 2 TIMES DAILY. 05/02/16   Deboraha Sprang, MD     Allergies Vancomycin; Sulfa antibiotics; and Penicillins   Family History  Problem Relation Age of Onset  . Heart disease Mother 66    A Fib, CHF  . Stroke Mother   . Heart disease Father   . Stroke Father   . Seizures Brother   . Seizures Daughter     Social History Social History  Substance Use Topics  . Smoking status: Former Smoker    Types: Cigarettes    Quit date: 06/04/1995  . Smokeless tobacco: Never Used  . Alcohol use 0.0  oz/week     Comment: occasional    Review of Systems  Constitutional:   No fever or chills.  ENT:   No sore throat. No rhinorrhea. Cardiovascular:   No chest pain. Respiratory:   No dyspnea or cough. Gastrointestinal:   Positive upper abdominal pain. Positive vomiting. No diarrhea or constipation..  Genitourinary:   Negative for dysuria or difficulty urinating. Musculoskeletal:   Negative for focal pain or swelling Neurological:   Negative for headaches 10-point ROS otherwise negative.  ____________________________________________   PHYSICAL EXAM:  VITAL SIGNS: ED Triage Vitals  Enc Vitals Group     BP 10/30/16 1358 135/81     Pulse Rate 10/30/16 1358 86     Resp 10/30/16 1358 18     Temp 10/30/16 1358 97.7 F (36.5 C)     Temp Source 10/30/16 1358 Oral     SpO2 10/30/16 1358 98 %  Weight 10/30/16 1400 148 lb (67.1 kg)     Height 10/30/16 1400 5\' 7"  (1.702 m)     Head Circumference --      Peak Flow --      Pain Score 10/30/16 1400 8     Pain Loc --      Pain Edu? --      Excl. in [place]? --     Vital signs reviewed, nursing assessments reviewed.   Constitutional:   Alert and oriented. Well appearing and in no distress. Eyes:   No scleral icterus. No conjunctival pallor. PERRL. EOMI.  No nystagmus. ENT   Head:   Normocephalic and atraumatic.   Nose:   No congestion/rhinnorhea. No septal hematoma   Mouth/Throat:   MMM, no pharyngeal erythema. No peritonsillar mass.    Neck:   No stridor. No SubQ emphysema. No meningismus. Hematological/Lymphatic/Immunilogical:   No cervical lymphadenopathy. Cardiovascular:   RRR. Symmetric bilateral radial and DP pulses.  No murmurs.  Respiratory:   Normal respiratory effort without tachypnea nor retractions. Breath sounds are clear and equal bilaterally. No wheezes/rales/rhonchi. Gastrointestinal:   Soft Without focal tenderness. She does have diffuse discomfort with palpation, worse in the left upper quadrant. The  right upper quadrant is actually relatively nontender.. Non distended. There is no CVA tenderness.  No rebound, rigidity, or guarding. Genitourinary:   deferred Musculoskeletal:   Normal range of motion in all extremities. No joint effusions.  No lower extremity tenderness.  No edema. Neurologic:   Normal speech and language.  CN 2-10 normal. Motor grossly intact. No gross focal neurologic deficits are appreciated.  Skin:    Skin is warm, dry and intact. No rash noted.  No petechiae, purpura, or bullae.  ____________________________________________    LABS (pertinent positives/negatives) (all labs ordered are listed, but only abnormal results are displayed) Labs Reviewed  COMPREHENSIVE METABOLIC PANEL - Abnormal; Notable for the following:       Result Value   Glucose, Bld 112 (*)    All other components within normal limits  CBC - Abnormal; Notable for the following:    WBC 3.1 (*)    All other components within normal limits  URINALYSIS, COMPLETE (UACMP) WITH MICROSCOPIC - Abnormal; Notable for the following:    Color, Urine YELLOW (*)    APPearance HAZY (*)    Ketones, ur 20 (*)    Protein, ur 30 (*)    Leukocytes, UA SMALL (*)    Squamous Epithelial / LPF 6-30 (*)    All other components within normal limits  LIPASE, BLOOD   ____________________________________________   EKG    ____________________________________________    RADIOLOGY  No results found.  ____________________________________________   PROCEDURES Procedures  ____________________________________________   INITIAL IMPRESSION / ASSESSMENT AND PLAN / ED COURSE  Pertinent labs & imaging results that were available during my care of the patient were reviewed by me and considered in my medical decision making (see chart for details).  Patient well appearing no acute distress. Presents with upper abdominal pain and vomiting. Exam is nonsurgical and nonfocal. Considering the patient's symptoms,  medical history, and physical examination today, I have low suspicion for cholecystitis or biliary pathology, pancreatitis, perforation or bowel obstruction, hernia, intra-abdominal abscess, AAA or dissection, volvulus or intussusception, mesenteric ischemia, or appendicitis.  I think most likely the patient is having a viral gastritis. She may develop diarrhea within the next few days as this progresses as well. I'll treat her with antiemetics and antacids. After a trial  of this in the emergency Department the patient was able to tolerate oral intake. Vital signs are normal, patient is suitable for outpatient follow-up with primary care. Usual return precautions given.       ____________________________________________   FINAL CLINICAL IMPRESSION(S) / ED DIAGNOSES  Final diagnoses:  Acute gastritis without hemorrhage, unspecified gastritis type  Non-intractable vomiting with nausea, unspecified vomiting type      New Prescriptions   DICYCLOMINE (BENTYL) 20 MG TABLET    Take 1 tablet (20 mg total) by mouth 3 (three) times daily as needed for spasms.   FAMOTIDINE (PEPCID) 20 MG TABLET    Take 1 tablet (20 mg total) by mouth 2 (two) times daily.   ONDANSETRON (ZOFRAN ODT) 4 MG DISINTEGRATING TABLET    Take 1 tablet (4 mg total) by mouth every 8 (eight) hours as needed for nausea or vomiting.     Portions of this note were generated with dragon dictation software. Dictation errors may occur despite best attempts at proofreading.    Carrie Mew, MD 10/30/16 [phone]

## 2016-10-31 ENCOUNTER — Ambulatory Visit: Payer: 59 | Admitting: Internal Medicine

## 2016-10-31 ENCOUNTER — Encounter: Payer: Self-pay | Admitting: Cardiology

## 2016-11-01 ENCOUNTER — Encounter: Payer: Self-pay | Admitting: Internal Medicine

## 2016-11-01 LAB — CUP PACEART REMOTE DEVICE CHECK
Brady Statistic AP VS Percent: 59.09 %
Brady Statistic AS VP Percent: 0.04 %
Brady Statistic AS VS Percent: 40.86 %
Brady Statistic RA Percent Paced: 59.1 %
Date Time Interrogation Session: 20180227093727
HIGH POWER IMPEDANCE MEASURED VALUE: 80 Ohm
HighPow Impedance: 494 Ohm
HighPow Impedance: 57 Ohm
Implantable Lead Location: 753859
Lead Channel Impedance Value: 456 Ohm
Lead Channel Pacing Threshold Amplitude: 0.625 V
Lead Channel Pacing Threshold Amplitude: 0.75 V
Lead Channel Sensing Intrinsic Amplitude: 2.375 mV
Lead Channel Setting Pacing Amplitude: 2.5 V
Lead Channel Setting Pacing Pulse Width: 0.4 ms
Lead Channel Setting Sensing Sensitivity: 0.45 mV
MDC IDC LEAD IMPLANT DT: 19970917
MDC IDC LEAD IMPLANT DT: 20070119
MDC IDC LEAD LOCATION: 753860
MDC IDC MSMT BATTERY VOLTAGE: 3.02 V
MDC IDC MSMT LEADCHNL RA PACING THRESHOLD PULSEWIDTH: 0.4 ms
MDC IDC MSMT LEADCHNL RA SENSING INTR AMPL: 2.375 mV
MDC IDC MSMT LEADCHNL RV IMPEDANCE VALUE: 494 Ohm
MDC IDC MSMT LEADCHNL RV PACING THRESHOLD PULSEWIDTH: 0.4 ms
MDC IDC MSMT LEADCHNL RV SENSING INTR AMPL: 12.375 mV
MDC IDC MSMT LEADCHNL RV SENSING INTR AMPL: 12.375 mV
MDC IDC PG IMPLANT DT: 20130722
MDC IDC SET LEADCHNL RA PACING AMPLITUDE: 2 V
MDC IDC STAT BRADY AP VP PERCENT: 0.02 %
MDC IDC STAT BRADY RV PERCENT PACED: 0.05 %

## 2016-11-01 NOTE — Progress Notes (Signed)
Fax received for clearance for colonoscopy by [place] for procedure 01/18/17. OK for colonoscopy per Dr. [name]. Clearance faxed to [phone]. Confirmation received.

## 2016-11-02 DIAGNOSIS — H9042 Sensorineural hearing loss, unilateral, left ear, with unrestricted hearing on the contralateral side: Secondary | ICD-10-CM | POA: Diagnosis not present

## 2016-12-10 DIAGNOSIS — H8110 Benign paroxysmal vertigo, unspecified ear: Secondary | ICD-10-CM | POA: Diagnosis not present

## 2016-12-11 DIAGNOSIS — K219 Gastro-esophageal reflux disease without esophagitis: Secondary | ICD-10-CM | POA: Diagnosis not present

## 2016-12-11 DIAGNOSIS — K581 Irritable bowel syndrome with constipation: Secondary | ICD-10-CM | POA: Diagnosis not present

## 2016-12-11 DIAGNOSIS — R1013 Epigastric pain: Secondary | ICD-10-CM | POA: Diagnosis not present

## 2016-12-21 DIAGNOSIS — R42 Dizziness and giddiness: Secondary | ICD-10-CM | POA: Diagnosis not present

## 2017-01-04 ENCOUNTER — Telehealth: Payer: Self-pay

## 2017-01-04 NOTE — Telephone Encounter (Signed)
Received a fax from [employer] stating that they have approved the pt's dexilant for one year. Both the pharmacy and the pt have been notified.   PA Reference #: [id number]

## 2017-01-17 ENCOUNTER — Encounter: Payer: Self-pay | Admitting: *Deleted

## 2017-01-18 ENCOUNTER — Encounter: Payer: Self-pay | Admitting: Anesthesiology

## 2017-01-18 ENCOUNTER — Encounter
Admission: RE | Disposition: A | Discharge status: Home / Self Care | Payer: Self-pay | Source: Ambulatory Visit | Attending: Unknown Physician Specialty

## 2017-01-18 ENCOUNTER — Ambulatory Visit
Admission: RE | Admit: 2017-01-18 | Discharge: 2017-01-18 | Disposition: A | Discharge status: Home / Self Care | Payer: 59 | Source: Ambulatory Visit | Attending: Unknown Physician Specialty | Admitting: Unknown Physician Specialty

## 2017-01-18 ENCOUNTER — Ambulatory Visit: Payer: 59 | Admitting: Anesthesiology

## 2017-01-18 DIAGNOSIS — I739 Peripheral vascular disease, unspecified: Secondary | ICD-10-CM | POA: Diagnosis not present

## 2017-01-18 DIAGNOSIS — Z87891 Personal history of nicotine dependence: Secondary | ICD-10-CM | POA: Diagnosis not present

## 2017-01-18 DIAGNOSIS — Z1211 Encounter for screening for malignant neoplasm of colon: Secondary | ICD-10-CM | POA: Insufficient documentation

## 2017-01-18 DIAGNOSIS — D123 Benign neoplasm of transverse colon: Secondary | ICD-10-CM | POA: Diagnosis not present

## 2017-01-18 DIAGNOSIS — F329 Major depressive disorder, single episode, unspecified: Secondary | ICD-10-CM | POA: Diagnosis not present

## 2017-01-18 DIAGNOSIS — K2950 Unspecified chronic gastritis without bleeding: Secondary | ICD-10-CM | POA: Insufficient documentation

## 2017-01-18 DIAGNOSIS — Z7982 Long term (current) use of aspirin: Secondary | ICD-10-CM | POA: Diagnosis not present

## 2017-01-18 DIAGNOSIS — Z950 Presence of cardiac pacemaker: Secondary | ICD-10-CM | POA: Diagnosis not present

## 2017-01-18 DIAGNOSIS — K210 Gastro-esophageal reflux disease with esophagitis: Secondary | ICD-10-CM | POA: Insufficient documentation

## 2017-01-18 DIAGNOSIS — K635 Polyp of colon: Secondary | ICD-10-CM | POA: Diagnosis not present

## 2017-01-18 DIAGNOSIS — K621 Rectal polyp: Secondary | ICD-10-CM | POA: Diagnosis not present

## 2017-01-18 DIAGNOSIS — K640 First degree hemorrhoids: Secondary | ICD-10-CM | POA: Diagnosis not present

## 2017-01-18 DIAGNOSIS — K2960 Other gastritis without bleeding: Secondary | ICD-10-CM | POA: Diagnosis not present

## 2017-01-18 DIAGNOSIS — K5730 Diverticulosis of large intestine without perforation or abscess without bleeding: Secondary | ICD-10-CM | POA: Insufficient documentation

## 2017-01-18 DIAGNOSIS — K5790 Diverticulosis of intestine, part unspecified, without perforation or abscess without bleeding: Secondary | ICD-10-CM | POA: Diagnosis not present

## 2017-01-18 HISTORY — PX: ESOPHAGOGASTRODUODENOSCOPY (EGD) WITH PROPOFOL: SHX5813

## 2017-01-18 HISTORY — PX: COLONOSCOPY WITH PROPOFOL: SHX5780

## 2017-01-18 SURGERY — COLONOSCOPY WITH PROPOFOL
Anesthesia: General

## 2017-01-18 MED ORDER — SODIUM CHLORIDE 0.9 % IV SOLN: INTRAVENOUS | Status: DC

## 2017-01-18 MED ORDER — FENTANYL CITRATE (PF) 100 MCG/2ML IJ SOLN: 25.0000 ug | INTRAMUSCULAR | Status: DC | PRN

## 2017-01-18 MED ORDER — ONDANSETRON HCL 4 MG/2ML IJ SOLN: 4.0000 mg | Freq: Once | INTRAMUSCULAR | Status: DC | PRN

## 2017-01-18 MED ORDER — PROPOFOL 500 MG/50ML IV EMUL
INTRAVENOUS | Status: DC | PRN
Start: 2017-01-18 — End: 2017-01-18
  Administered 2017-01-18: 100 ug/kg/min via INTRAVENOUS

## 2017-01-18 MED ORDER — EPHEDRINE SULFATE-NACL 50-0.9 MG/10ML-% IV SOSY
PREFILLED_SYRINGE | INTRAVENOUS | Status: DC | PRN
  Administered 2017-01-18: 5 mg via INTRAVENOUS
  Administered 2017-01-18: 10 mg via INTRAVENOUS

## 2017-01-18 MED ORDER — SODIUM CHLORIDE 0.9 % IV SOLN
INTRAVENOUS | Status: DC
  Administered 2017-01-18: 09:00:00 via INTRAVENOUS

## 2017-01-18 MED ORDER — PROPOFOL 10 MG/ML IV BOLUS
INTRAVENOUS | Status: DC | PRN
  Administered 2017-01-18 (×2): 20 mg via INTRAVENOUS
  Administered 2017-01-18: 100 mg via INTRAVENOUS
  Administered 2017-01-18: 10 mg via INTRAVENOUS
  Administered 2017-01-18: 20 mg via INTRAVENOUS

## 2017-01-18 NOTE — Anesthesia Post-op Follow-up Note (Cosign Needed)
Anesthesia QCDR form completed.        

## 2017-01-18 NOTE — Transfer of Care (Signed)
Immediate Anesthesia Transfer of Care Note  Patient: Nicole Swanson  Procedure(s) Performed: Procedure(s): COLONOSCOPY WITH PROPOFOL (N/A) ESOPHAGOGASTRODUODENOSCOPY (EGD) WITH PROPOFOL (N/A)  Patient Location: PACU and Endoscopy Unit  Anesthesia Type:General  Level of Consciousness: awake  Airway & Oxygen Therapy: Patient Spontanous Breathing and Patient connected to nasal cannula oxygen  Post-op Assessment: Report given to RN and Post -op Vital signs reviewed and stable  Post vital signs: Reviewed and stable  Last Vitals:  Vitals:   01/18/17 0900 01/18/17 1023  BP: 129/70 (!) 96/52  Pulse: 62 69  Resp: 16 14  Temp: 36.4 C (!) 36 C    Last Pain:  Vitals:   01/18/17 1023  TempSrc: Tympanic         Complications: No apparent anesthesia complications

## 2017-01-18 NOTE — Anesthesia Preprocedure Evaluation (Addendum)
Anesthesia Evaluation  Patient identified by MRN, date of birth, ID band Patient awake    Reviewed: Allergy & Precautions, NPO status , Patient's Chart, lab work & pertinent test results  History of Anesthesia Complications (+) PONV and history of anesthetic complications  Airway Mallampati: II  TM Distance: >3 FB Neck ROM: Full    Dental  (+) Teeth Intact, Dental Advisory Given   Pulmonary shortness of breath and with exertion, asthma , former smoker,    Pulmonary exam normal        Cardiovascular + angina + Peripheral Vascular Disease and + DVT  Normal cardiovascular exam+ dysrhythmias Ventricular Tachycardia + pacemaker + Cardiac Defibrillator + Valvular Problems/Murmurs    The left ventricular ejection fraction is normal (55-65%).  There was no ST segment deviation noted during stress.  The study is normal.  This is a low risk study.     Neuro/Psych PSYCHIATRIC DISORDERS Depression  Neuromuscular disease    GI/Hepatic negative GI ROS, Neg liver ROS,   Endo/Other  negative endocrine ROS  Renal/GU negative Renal ROS     Musculoskeletal  (+) Arthritis , Osteoarthritis,    Abdominal   Peds negative pediatric ROS (+)  Hematology negative hematology ROS (+)   Anesthesia Other Findings   Reproductive/Obstetrics                            Anesthesia Physical  Anesthesia Plan  ASA: III  Anesthesia Plan: General   Post-op Pain Management:    Induction: Intravenous  Airway Management Planned: Nasal Cannula  Additional Equipment:   Intra-op Plan:   Post-operative Plan:   Informed Consent: I have reviewed the patients History and Physical, chart, labs and discussed the procedure including the risks, benefits and alternatives for the proposed anesthesia with the patient or authorized representative who has indicated his/her understanding and acceptance.   Dental advisory  given  Plan Discussed with: CRNA and Anesthesiologist  Anesthesia Plan Comments:        Anesthesia Quick Evaluation

## 2017-01-18 NOTE — Op Note (Signed)
[facility] Gastroenterology Patient Name: Nicole Swanson Procedure Date: 01/18/2017 9:30 AM MRN: [id number] Account #: [id number] Date of Birth: [birth year] Admit Type: Outpatient Age: 61 Room: [facility] ENDO ROOM 1 Gender: Female Note Status: Finalized Procedure:            Upper GI endoscopy Indications:          Epigastric abdominal pain, Dysphagia Providers:            Manya Silvas, MD Referring MD:         Deborra Medina, MD (Referring MD) Medicines:            Propofol per Anesthesia Complications:        No immediate complications. Procedure:            Pre-Anesthesia Assessment:                       - After reviewing the risks and benefits, the patient                        was deemed in satisfactory condition to undergo the                        procedure.                       After obtaining informed consent, the endoscope was                        passed under direct vision. Throughout the procedure,                        the patient's blood pressure, pulse, and oxygen                        saturations were monitored continuously. The                        Colonoscope was introduced through the mouth, and                        advanced to the second part of duodenum. The upper GI                        endoscopy was accomplished without difficulty. The                        patient tolerated the procedure well. Findings:      LA Grade A (one or more mucosal breaks less than 5 mm, not extending       between tops of 2 mucosal folds) esophagitis with no bleeding was found       38 cm from the incisors. Gastric mucosa adjacent to esophagus was also       mildly inflammed. Biopsies were taken with a cold forceps for histology.      The entire examined stomach was normal. Because of epigastric pain       Biopsies were taken with a cold forceps for histology. Done from body       and antrum. Biopsies were taken with a cold forceps for Helicobacter    pylori testing.      The examined duodenum was normal. Impression:           -  LA Grade A reflux esophagitis. Biopsied.                       - Normal stomach. Biopsied.                       - Normal examined duodenum. Recommendation:       - Await pathology results.                       - Perform a colonoscopy as previously scheduled. Take                        medicine for esophagitis Manya Silvas, MD 01/18/2017 9:48:45 AM This report has been signed electronically. Number of Addenda: 0 Note Initiated On: 01/18/2017 9:30 AM      [facility]

## 2017-01-18 NOTE — H&P (Signed)
Primary Care Physician:  [name], MD Primary Gastroenterologist:  Dr. Vira Agar  Pre-Procedure History & Physical: HPI:  Nicole Swanson is a 61 y.o. female is here for an endoscopy and colonoscopy.   Past Medical History:  Diagnosis Date  . Aborted cardiac arrest 1994  . AICD (automatic cardioverter/defibrillator) present   . Allergy   . Anginal pain ([place])   . Arthritis   . Asthma    hx of asthma related to air bag release   . Dual implantable cardiac defibrillator    Medtronic initially implanted in 1997  . DVT (deep vein thrombosis) in pregnancy ([place]) 1986   had heparin shots followed by coumadin  . Family history of seizures    Question neurological versus cardiac  . Heart murmur   . Irritable bowel syndrome   . 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 PONV (postoperative nausea and vomiting)   . Presence of permanent cardiac pacemaker   . PVC's (premature ventricular contractions)   . Raynaud's syndrome   . Shortness of breath dyspnea    with exertion   . Ventricular tachycardia [facility])     Past Surgical History:  Procedure Laterality Date  . ABDOMINAL HYSTERECTOMY    . APPENDECTOMY    . CARDIAC DEFIBRILLATOR PLACEMENT  1994   Dual chamber with pacemaker  . CARPAL TUNNEL RELEASE Right   . CYSTOCELE REPAIR N/A 06/19/2016   Procedure: ANTERIOR REPAIR (CYSTOCELE) WITH VAULT PROLAPSE;  Surgeon: Bjorn Loser, MD;  Location: [facility] ORS;  Service: Urology;  Laterality: N/A;  . CYSTOSCOPY N/A 06/19/2016   Procedure: CYSTOSCOPY;  Surgeon: Bjorn Loser, MD;  Location: [facility];  Service: Urology;  Laterality: N/A;  . LAPAROSCOPIC LYSIS OF ADHESIONS    . NASAL SEPTUM SURGERY  [address]   . OOPHORECTOMY    . PACEMAKER INSERTION     5 different devices    Prior to Admission medications   Medication Sig Start Date End Date Taking? Authorizing Provider  aspirin EC 81 MG tablet Take 81 mg by mouth daily.   Yes [provider]   Calcium Carbonate-Vitamin D (CALCIUM-VITAMIN D) 500-200 MG-UNIT tablet Take 1 tablet by mouth daily.   Yes [provider]  Cranberry 1000 MG CAPS Take by mouth.   Yes [provider]  dexlansoprazole (DEXILANT) 60 MG capsule Take 60 mg by mouth daily as needed (heartburn).   Yes [provider]  dicyclomine (BENTYL) 20 MG tablet Take 1 tablet (20 mg total) by mouth 3 (three) times daily as needed for spasms. 10/30/16  Yes Carrie Mew, MD  docusate sodium (COLACE) 250 MG capsule Take 250 mg by mouth 2 (two) times daily.   Yes [provider]  famotidine (PEPCID) 20 MG tablet Take 1 tablet (20 mg total) by mouth 2 (two) times daily. 10/30/16  Yes Carrie Mew, MD  fexofenadine (ALLEGRA) 180 MG tablet Take 180 mg by mouth daily as needed for allergies or rhinitis.    Yes [provider]  fluticasone (FLONASE) 50 MCG/ACT nasal spray USE 1 SPRAY IN EACH NOSTRIL DAILY 08/14/16  Yes [name], MD  montelukast (SINGULAIR) 10 MG tablet TAKE 1 TABLET BY MOUTH AT BEDTIME. 08/14/16  Yes [name], MD  ondansetron (ZOFRAN ODT) 4 MG disintegrating tablet Take 1 tablet (4 mg total) by mouth every 8 (eight) hours as needed for nausea or vomiting. 10/30/16  Yes Carrie Mew, MD  senna (SENOKOT) 8.6 MG TABS  tablet Take 1 tablet (8.6 mg total) by mouth daily. 06/20/16  Yes MacDiarmid, Nicki Reaper, MD  sotalol (BETAPACE) 120 MG tablet TAKE 1 TABLET BY MOUTH 2 TIMES DAILY. 05/02/16  Yes Deboraha Sprang, MD    Allergies as of 11/12/2016 - Review Complete 10/30/2016  Allergen Reaction Noted  . Vancomycin Rash 06/04/2011  . Sulfa antibiotics Diarrhea 10/12/2014  . Penicillins Rash 06/04/2011    Family History  Problem Relation Age of Onset  . Heart disease Mother 54       A Fib, CHF  . Stroke Mother   . Heart disease Father   . Stroke Father   . Seizures Brother   . Seizures Daughter     Social History   Social History  . Marital status:  Widowed    Spouse name: N/A  . Number of children: N/A  . Years of education: N/A   Occupational History  . Not on file.   Social History Main Topics  . Smoking status: Former Smoker    Types: Cigarettes    Quit date: 06/04/1995  . Smokeless tobacco: Never Used  . Alcohol use 0.0 oz/week     Comment: occasional  . Drug use: No  . Sexual activity: Not on file   Other Topics Concern  . Not on file   Social History Narrative  . No narrative on file    Review of Systems: See HPI, otherwise negative ROS  Physical Exam: BP (!) 96/52   Pulse 69   Temp (!) 96.8 F (36 C) (Tympanic)   Resp 14   Ht 5\' 7"  (1.702 m)   Wt 64.4 kg (142 lb)   SpO2 100%   BMI 22.24 kg/m  General:   Alert,  pleasant and cooperative in NAD Head:  Normocephalic and atraumatic. Neck:  Supple; no masses or thyromegaly. Lungs:  Clear throughout to auscultation.    Heart:  Regular rate and rhythm.  Has implanted defib device. Abdomen:  Soft, nontender and nondistended. Normal bowel sounds, without guarding, and without rebound.   Neurologic:  Alert and  oriented x4;  grossly normal neurologically.  Impression/Plan: Nicole Swanson is here for an endoscopy and colonoscopy to be performed for screening colonoscopy and epigastric pain and GERD.  Risks, benefits, limitations, and alternatives regarding  endoscopy and colonoscopy have been reviewed with the patient.  Questions have been answered.  All parties agreeable.   Gaylyn Cheers, MD  01/18/2017, 10:30 AM

## 2017-01-18 NOTE — Op Note (Signed)
[facility] Gastroenterology Patient Name: Nicole Swanson Procedure Date: 01/18/2017 9:30 AM MRN: [id number] Account #: [id number] Date of Birth: 16-Jan-1956 Admit Type: Outpatient Age: 61 Room: [facility] ENDO ROOM 1 Gender: Female Note Status: Finalized Procedure:            Colonoscopy Indications:          Screening for colorectal malignant neoplasm Providers:            Manya Silvas, MD Referring MD:         Deborra Medina, MD (Referring MD) Medicines:            Propofol per Anesthesia Complications:        No immediate complications. Procedure:            Pre-Anesthesia Assessment:                       - After reviewing the risks and benefits, the patient                        was deemed in satisfactory condition to undergo the                        procedure.                       After obtaining informed consent, the colonoscope was                        passed under direct vision. Throughout the procedure,                        the patient's blood pressure, pulse, and oxygen                        saturations were monitored continuously. The                        Colonoscope was introduced through the anus and                        advanced to the the cecum, identified by appendiceal                        orifice and ileocecal valve. The patient tolerated the                        procedure well. Findings:      A diminutive polyp was found in the transverse colon. The polyp was       sessile. The polyp was removed with a cold snare. Resection and       retrieval were complete.      Three sessile polyps were found in the rectum and recto-sigmoid colon.       The polyps were diminutive in size. These polyps were removed with a       jumbo cold forceps. Resection and retrieval were complete.      Many small-mouthed diverticula were found in the sigmoid colon.      Internal hemorrhoids were found during endoscopy. The hemorrhoids were       small and  Grade I (internal hemorrhoids that do not prolapse).      The  exam was otherwise without abnormality. Impression:           - One diminutive polyp in the transverse colon, removed                        with a cold snare. Resected and retrieved.                       - Three diminutive polyps in the rectum and at the                        recto-sigmoid colon, removed with a jumbo cold forceps.                        Resected and retrieved.                       - Diverticulosis in the sigmoid colon.                       - Internal hemorrhoids.                       - The examination was otherwise normal. Recommendation:       - Await pathology results. Manya Silvas, MD 01/18/2017 10:22:28 AM This report has been signed electronically. Number of Addenda: 0 Note Initiated On: 01/18/2017 9:30 AM Scope Withdrawal Time: 0 hours 17 minutes 33 seconds  Total Procedure Duration: 0 hours 28 minutes 48 seconds       [facility]

## 2017-01-18 NOTE — Anesthesia Postprocedure Evaluation (Signed)
Anesthesia Post Note  Patient: Nicole Swanson  Procedure(s) Performed: Procedure(s) (LRB): COLONOSCOPY WITH PROPOFOL (N/A) ESOPHAGOGASTRODUODENOSCOPY (EGD) WITH PROPOFOL (N/A)  Patient location during evaluation: PACU Anesthesia Type: General Level of consciousness: awake and alert and oriented Pain management: pain level controlled Vital Signs Assessment: post-procedure vital signs reviewed and stable Respiratory status: spontaneous breathing Cardiovascular status: blood pressure returned to baseline Anesthetic complications: no     Last Vitals:  Vitals:   01/18/17 1043 01/18/17 1053  BP: 128/71 (!) 118/58  Pulse: (!) 59 (!) 59  Resp: 19 10  Temp:      Last Pain:  Vitals:   01/18/17 1023  TempSrc: Tympanic                 [name]

## 2017-01-22 LAB — SURGICAL PATHOLOGY

## 2017-01-24 ENCOUNTER — Encounter: Payer: Self-pay | Admitting: Unknown Physician Specialty

## 2017-01-29 ENCOUNTER — Encounter: Payer: Self-pay | Admitting: Internal Medicine

## 2017-01-29 ENCOUNTER — Ambulatory Visit (INDEPENDENT_AMBULATORY_CARE_PROVIDER_SITE_OTHER): Payer: 59 | Admitting: Internal Medicine

## 2017-01-29 VITALS — BP 120/70 | HR 65 | Ht 67.0 in | Wt 145.2 lb

## 2017-01-29 DIAGNOSIS — Z79899 Other long term (current) drug therapy: Secondary | ICD-10-CM

## 2017-01-29 DIAGNOSIS — Z95810 Presence of automatic (implantable) cardiac defibrillator: Secondary | ICD-10-CM | POA: Diagnosis not present

## 2017-01-29 DIAGNOSIS — I469 Cardiac arrest, cause unspecified: Secondary | ICD-10-CM | POA: Diagnosis not present

## 2017-01-29 DIAGNOSIS — I493 Ventricular premature depolarization: Secondary | ICD-10-CM | POA: Diagnosis not present

## 2017-01-29 NOTE — Patient Instructions (Signed)
Medication Instructions: - Your physician recommends that you continue on your current medications as directed. Please refer to the Current Medication list given to you today.  Labwork: - Your physician recommends that you have lab work today: BMP/Magnesium  Procedures/Testing: - none ordered  Follow-Up: - Remote monitoring is used to monitor your Pacemaker of ICD from home. This monitoring reduces the number of office visits required to check your device to one time per year. It allows [place] to keep an eye on the functioning of your device to ensure it is working properly. You are scheduled for a device check from home on 04/30/17. You may send your transmission at any time that day. If you have a wireless device, the transmission will be sent automatically. After your physician reviews your transmission, you will receive a postcard with your next transmission date.  - Your physician wants you to follow-up in: 6 months with Dr. [name]. You will receive a reminder letter in the mail two months in advance. If you don't receive a letter, please call our office to schedule the follow-up appointment.   Any Additional Special Instructions Will Be Listed Below (If Applicable).     If you need a refill on your cardiac medications before your next appointment, please call your pharmacy.

## 2017-01-29 NOTE — Progress Notes (Signed)
Patient Care Team: [name], MD as PCP - General (Internal Medicine)   HPI  Nicole Swanson is a 61 y.o. female Seen in followup for ICD implanted for aborted cardiac arrest. Her care initially had been at [facility]. She is status post dual chamber ICD implantation and generator replacement was recently done by me summer 2013   Echocardiogram 2011 demonstrated normal left ventricular function valve function and dimension sizes   She also has a history of ventricular ectopy for which she has been on sotalol in which Dr. Sharon Seller was going to increase prior to her transferring her care to [facility]   Her husband died 71 .    She has stable DOE  More recently she has awakened at night with palps, flushing and some SOB  Episodes last < 1 min  They are more frequent of late-- no CP   No hx of sleep apnea   No day time somnolence   Biggest problem of late has been recurrent UTIs following bladder surgery   Date Cr Mg K  4/15  0.9 2.4    10/15  0.9   4.5  11/17 0.7  3.9  2/18 0.64  4.0        Device History: MDT dual chamber ICD implanted 1997 for aborted cardiac arrest; gen change 2007; gen change 2013 History of appropriate therapy: Yes History of AAD therapy: Yes - sotalol  DATE TEST    10/17    Myoview   EF 65 % No ischemia          Past Medical History:  Diagnosis Date  . Aborted cardiac arrest 1994  . AICD (automatic cardioverter/defibrillator) present   . Allergy   . Anginal pain ([place])   . Arthritis   . Asthma    hx of asthma related to air bag release   . Dual implantable cardiac defibrillator    Medtronic initially implanted in 1997  . DVT (deep vein thrombosis) in pregnancy ([place]) 1986   had heparin shots followed by coumadin  . Family history of seizures    Question neurological versus cardiac  . Heart murmur   . Irritable bowel syndrome   . 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 PONV (postoperative nausea and vomiting)   .  Presence of permanent cardiac pacemaker   . PVC's (premature ventricular contractions)   . Raynaud's syndrome   . Shortness of breath dyspnea    with exertion   . Ventricular tachycardia [facility])     Past Surgical History:  Procedure Laterality Date  . ABDOMINAL HYSTERECTOMY    . APPENDECTOMY    . CARDIAC DEFIBRILLATOR PLACEMENT  1994   Dual chamber with pacemaker  . CARPAL TUNNEL RELEASE Right   . COLONOSCOPY WITH PROPOFOL N/A 01/18/2017   Procedure: COLONOSCOPY WITH PROPOFOL;  Surgeon: Manya Silvas, MD;  Location: [facility] ENDOSCOPY;  Service: Endoscopy;  Laterality: N/A;  . CYSTOCELE REPAIR N/A 06/19/2016   Procedure: ANTERIOR REPAIR (CYSTOCELE) WITH VAULT PROLAPSE;  Surgeon: Bjorn Loser, MD;  Location: [facility] ORS;  Service: Urology;  Laterality: N/A;  . CYSTOSCOPY N/A 06/19/2016   Procedure: CYSTOSCOPY;  Surgeon: Bjorn Loser, MD;  Location: [facility] ORS;  Service: Urology;  Laterality: N/A;  . ESOPHAGOGASTRODUODENOSCOPY (EGD) WITH PROPOFOL N/A 01/18/2017   Procedure: ESOPHAGOGASTRODUODENOSCOPY (EGD) WITH PROPOFOL;  Surgeon: Manya Silvas, MD;  Location: [facility] ENDOSCOPY;  Service: Endoscopy;  Laterality: N/A;  . LAPAROSCOPIC LYSIS OF ADHESIONS    .  NASAL SEPTUM SURGERY  [address]   . OOPHORECTOMY    . PACEMAKER INSERTION     5 different devices    Current Outpatient Prescriptions  Medication Sig Dispense Refill  . aspirin EC 81 MG tablet Take 81 mg by mouth daily.    . Calcium Carbonate-Vitamin D (CALCIUM-VITAMIN D) 500-200 MG-UNIT tablet Take 1 tablet by mouth daily.    . Cranberry 1000 MG CAPS Take by mouth.    . dicyclomine (BENTYL) 20 MG tablet Take 1 tablet (20 mg total) by mouth 3 (three) times daily as needed for spasms. 30 tablet 0  . docusate sodium (COLACE) 250 MG capsule Take 250 mg by mouth 2 (two) times daily.    . famotidine (PEPCID) 20 MG tablet Take 1 tablet (20 mg total) by mouth 2 (two) times daily. 60 tablet 0  . fexofenadine (ALLEGRA) 180 MG  tablet Take 180 mg by mouth daily as needed for allergies or rhinitis.     . fluticasone (FLONASE) 50 MCG/ACT nasal spray USE 1 SPRAY IN EACH NOSTRIL DAILY 16 g 5  . montelukast (SINGULAIR) 10 MG tablet TAKE 1 TABLET BY MOUTH AT BEDTIME. 90 tablet 1  . omeprazole (PRILOSEC) 40 MG capsule Take 40 mg by mouth 2 (two) times daily.    . ondansetron (ZOFRAN ODT) 4 MG disintegrating tablet Take 1 tablet (4 mg total) by mouth every 8 (eight) hours as needed for nausea or vomiting. 20 tablet 0  . senna (SENOKOT) 8.6 MG tablet Take 1 tablet by mouth as needed for constipation.    . sotalol (BETAPACE) 120 MG tablet TAKE 1 TABLET BY MOUTH 2 TIMES DAILY. 180 tablet 2   No current facility-administered medications for this visit.     Allergies  Allergen Reactions  . Vancomycin Rash    Other Reaction: RASH & WELTS  . Penicillins Rash    Has patient had a PCN reaction causing immediate rash, facial/tongue/throat swelling, SOB or lightheadedness with hypotension: No Has patient had a PCN reaction causing severe rash involving mucus membranes or skin necrosis: No Has patient had a PCN reaction that required hospitalization: No Has patient had a PCN reaction occurring within the last 10 years: No If all of the above answers are "NO", then may proceed with Cephalosporin use.   . Sulfa Antibiotics Diarrhea    Review of Systems negative except from HPI and PMH  Physical Exam BP 120/70 (BP Location: Left Arm, Patient Position: Sitting, Cuff Size: Normal)   Pulse 65   Ht 5\' 7"  (1.702 m)   Wt 145 lb 4 oz (65.9 kg)   BMI 22.75 kg/m  Well developed and well nourished in no acute distress HENT normal E scleral and icterus clear Neck Supple JVP flat; carotids brisk and full Clear to ausculation Device pocket well healed; without hematoma or erythema.  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  The information was reviewed. No changes were made in the programming.    No intercurrent Ventricular tachycardia  No VTNS detected   HR excursion reasonable and myoview was neg  We  discussed monitoring strategies.  Episodes a few times a week, so holter not likely to work  She is not interested in "testing" after all, would have used ZIO, but she is open to trying ALIVECOR  We spent more than 50% of our >25 min visit in face to face counseling regarding the above

## 2017-01-30 LAB — BASIC METABOLIC PANEL
BUN/Creatinine Ratio: 13 (ref 12–28)
BUN: 10 mg/dL (ref 8–27)
CALCIUM: 10.2 mg/dL (ref 8.7–10.3)
CHLORIDE: 102 mmol/L (ref 96–106)
CO2: 26 mmol/L (ref 18–29)
CREATININE: 0.79 mg/dL (ref 0.57–1.00)
GFR calc Af Amer: 92 mL/min/{1.73_m2} (ref >59)
GFR calc non Af Amer: 80 mL/min/{1.73_m2} (ref >59)
GLUCOSE: 99 mg/dL (ref 65–99)
Potassium: 4.5 mmol/L (ref 3.5–5.2)
Sodium: 142 mmol/L (ref 134–144)

## 2017-01-30 LAB — MAGNESIUM: MAGNESIUM: 2.5 mg/dL — AB (ref 1.6–2.3)

## 2017-02-04 ENCOUNTER — Other Ambulatory Visit: Payer: Self-pay | Admitting: Internal Medicine

## 2017-02-13 ENCOUNTER — Telehealth: Payer: Self-pay | Admitting: Internal Medicine

## 2017-02-13 ENCOUNTER — Ambulatory Visit: Payer: Self-pay | Admitting: Physician Assistant

## 2017-02-13 ENCOUNTER — Ambulatory Visit: Payer: 59 | Admitting: Family

## 2017-02-13 ENCOUNTER — Encounter: Payer: Self-pay | Admitting: Physician Assistant

## 2017-02-13 VITALS — BP 110/70 | HR 70 | Temp 97.8°F

## 2017-02-13 DIAGNOSIS — J0100 Acute maxillary sinusitis, unspecified: Secondary | ICD-10-CM

## 2017-02-13 MED ORDER — PREDNISONE 10 MG PO TABS: 30.0000 mg | ORAL_TABLET | Freq: Every day | ORAL | 0 refills | Status: DC

## 2017-02-13 NOTE — Progress Notes (Signed)
S: C/o runny nose and congestion for 4 days, no fever, chills, cp/sob, v/d; mucus was green and thick yesterday, no mucus today, c/o of facial and dental pain.   Using otc meds:   O: PE: vitals wnl, nad, perrl eomi, normocephalic, tms dull, nasal mucosa red and swollen, throat injected, neck supple no lymph, lungs c t a, cv rrr, neuro intact  A:  Acute sinusitis   P: drink fluids, continue regular meds , use otc meds of choice, return if not improving in 5 days, return earlier if worsening , pred 30mg  qd, if worsening or not better by Monday will call in an antibiotic

## 2017-02-13 NOTE — Telephone Encounter (Signed)
FYI

## 2017-02-13 NOTE — Telephone Encounter (Signed)
FYI - Pt called and cancelled appt. Pt states that she is feeling much better.

## 2017-02-17 LAB — CUP PACEART INCLINIC DEVICE CHECK
Brady Statistic AP VP Percent: 0.02 %
Brady Statistic AP VS Percent: 58.87 %
Brady Statistic AS VP Percent: 0.04 %
Brady Statistic RA Percent Paced: 58.88 %
HIGH POWER IMPEDANCE MEASURED VALUE: 76 Ohm
HighPow Impedance: 51 Ohm
HighPow Impedance: 551 Ohm
Implantable Lead Implant Date: 20070119
Lead Channel Pacing Threshold Amplitude: 0.75 V
Lead Channel Pacing Threshold Pulse Width: 0.4 ms
Lead Channel Pacing Threshold Pulse Width: 0.4 ms
Lead Channel Setting Pacing Amplitude: 2 V
Lead Channel Setting Pacing Amplitude: 2.5 V
Lead Channel Setting Pacing Pulse Width: 0.4 ms
Lead Channel Setting Sensing Sensitivity: 0.45 mV
MDC IDC LEAD IMPLANT DT: 19970917
MDC IDC LEAD LOCATION: 753859
MDC IDC LEAD LOCATION: 753860
MDC IDC MSMT BATTERY VOLTAGE: 3.03 V
MDC IDC MSMT LEADCHNL RA IMPEDANCE VALUE: 437 Ohm
MDC IDC MSMT LEADCHNL RA PACING THRESHOLD AMPLITUDE: 0.75 V
MDC IDC MSMT LEADCHNL RA SENSING INTR AMPL: 2.25 mV
MDC IDC MSMT LEADCHNL RV IMPEDANCE VALUE: 513 Ohm
MDC IDC MSMT LEADCHNL RV SENSING INTR AMPL: 14.375 mV
MDC IDC PG IMPLANT DT: 20130722
MDC IDC SESS DTM: 20180529142032
MDC IDC STAT BRADY AS VS PERCENT: 41.08 %
MDC IDC STAT BRADY RV PERCENT PACED: 0.05 %

## 2017-02-18 ENCOUNTER — Other Ambulatory Visit: Payer: Self-pay | Admitting: Internal Medicine

## 2017-03-22 ENCOUNTER — Other Ambulatory Visit: Payer: Self-pay | Admitting: Internal Medicine

## 2017-04-30 ENCOUNTER — Ambulatory Visit (INDEPENDENT_AMBULATORY_CARE_PROVIDER_SITE_OTHER): Payer: 59 | Admitting: *Deleted

## 2017-04-30 DIAGNOSIS — I469 Cardiac arrest, cause unspecified: Secondary | ICD-10-CM

## 2017-04-30 NOTE — Progress Notes (Signed)
Remote ICD transmission.   

## 2017-05-01 LAB — CUP PACEART REMOTE DEVICE CHECK
Battery Voltage: 3 V
Brady Statistic AP VP Percent: 0.02 %
Brady Statistic AS VP Percent: 0.03 %
Brady Statistic RA Percent Paced: 67.78 %
Brady Statistic RV Percent Paced: 0.05 %
HIGH POWER IMPEDANCE MEASURED VALUE: 437 Ohm
HighPow Impedance: 51 Ohm
HighPow Impedance: 76 Ohm
Implantable Lead Implant Date: 19970917
Implantable Lead Location: 753859
Implantable Lead Location: 753860
Implantable Lead Model: 5076
Implantable Lead Model: 6942
Implantable Pulse Generator Implant Date: 20130722
Lead Channel Impedance Value: 456 Ohm
Lead Channel Impedance Value: 494 Ohm
Lead Channel Pacing Threshold Amplitude: 0.625 V
Lead Channel Pacing Threshold Pulse Width: 0.4 ms
Lead Channel Sensing Intrinsic Amplitude: 1.875 mV
Lead Channel Sensing Intrinsic Amplitude: 1.875 mV
Lead Channel Sensing Intrinsic Amplitude: 10.625 mV
Lead Channel Setting Pacing Pulse Width: 0.4 ms
Lead Channel Setting Sensing Sensitivity: 0.45 mV
MDC IDC LEAD IMPLANT DT: 20070119
MDC IDC MSMT LEADCHNL RV PACING THRESHOLD AMPLITUDE: 0.75 V
MDC IDC MSMT LEADCHNL RV PACING THRESHOLD PULSEWIDTH: 0.4 ms
MDC IDC MSMT LEADCHNL RV SENSING INTR AMPL: 10.625 mV
MDC IDC SESS DTM: 20180828062728
MDC IDC SET LEADCHNL RA PACING AMPLITUDE: 2 V
MDC IDC SET LEADCHNL RV PACING AMPLITUDE: 2.5 V
MDC IDC STAT BRADY AP VS PERCENT: 67.76 %
MDC IDC STAT BRADY AS VS PERCENT: 32.18 %

## 2017-05-10 ENCOUNTER — Encounter: Payer: Self-pay | Admitting: Cardiology

## 2017-05-23 ENCOUNTER — Other Ambulatory Visit: Payer: Self-pay | Admitting: Internal Medicine

## 2017-05-29 NOTE — Telephone Encounter (Signed)
Error

## 2017-07-04 ENCOUNTER — Other Ambulatory Visit: Payer: Self-pay | Admitting: Internal Medicine

## 2017-07-08 ENCOUNTER — Ambulatory Visit (INDEPENDENT_AMBULATORY_CARE_PROVIDER_SITE_OTHER): Payer: 59 | Admitting: Family

## 2017-07-08 VITALS — BP 112/62 | HR 77 | Temp 97.5°F | Ht 67.0 in | Wt 149.6 lb

## 2017-07-08 DIAGNOSIS — K581 Irritable bowel syndrome with constipation: Secondary | ICD-10-CM

## 2017-07-08 DIAGNOSIS — M130 Polyarthritis, unspecified: Secondary | ICD-10-CM

## 2017-07-08 DIAGNOSIS — H6992 Unspecified Eustachian tube disorder, left ear: Secondary | ICD-10-CM | POA: Diagnosis not present

## 2017-07-08 MED ORDER — DULOXETINE HCL 30 MG PO CPEP: ORAL_CAPSULE | ORAL | 3 refills | Status: DC

## 2017-07-08 MED ORDER — DICYCLOMINE HCL 20 MG PO TABS: 20.0000 mg | ORAL_TABLET | Freq: Three times a day (TID) | ORAL | 1 refills | Status: DC | PRN

## 2017-07-08 NOTE — Progress Notes (Signed)
Subjective:    Patient ID: Nicole Swanson, female    DOB: [birth year], 61 y.o.   MRN: [id number]  CC: [name] is a 61 y.o. female who presents today for an acute visit.    HPI: CC: 'arthritic pain' for years, and getting worse. Notes low back, knees, hips, and hands. Worse after activity.   Taking otc motrin, ibuprofen with some relief.   Tried gabapentin and didn't like it; didn't like injections' had a bad experience'.  Also notes monthly, 'bouts with stomach' with cramping, vomiting , and thinks ibuprofen aggrevated stomach as well. No abominal pain, cramping today. No fever, N, V.   Tried mobic in past with some relief.   Would like refill of bentyl prn for abdominal cramping; h/o IBS, gastritis.    EGD/ colonscopy 01/2017- started 40mg  prilosec  Also notes left ear pain x 2 weeks, unchanged. Dull ache. No fever, increased sinus congestion, drainage, changes in hearing loss.  Wears hearing aid in left ear.  Seasonal allergies. On allegra, flonase   No seizure disorder, alcohol abuse.  HISTORY:  Past Medical History:  Diagnosis Date  . Aborted cardiac arrest 1994  . AICD (automatic cardioverter/defibrillator) present   . Allergy   . Anginal pain ([place])   . Arthritis   . Asthma    hx of asthma related to air bag release   . Dual implantable cardiac defibrillator    Medtronic initially implanted in 1997  . DVT (deep vein thrombosis) in pregnancy ([place]) 1986   had heparin shots followed by coumadin  . Family history of seizures    Question neurological versus cardiac  . Heart murmur   . Irritable bowel syndrome   . 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 PONV (postoperative nausea and vomiting)   . Presence of permanent cardiac pacemaker   . PVC's (premature ventricular contractions)   . Raynaud's syndrome   . Shortness of breath dyspnea    with exertion   . Ventricular tachycardia [facility])    Past Surgical History:  Procedure Laterality Date  .  ABDOMINAL HYSTERECTOMY    . APPENDECTOMY    . CARDIAC DEFIBRILLATOR PLACEMENT  1994   Dual chamber with pacemaker  . CARPAL TUNNEL RELEASE Right   . LAPAROSCOPIC LYSIS OF ADHESIONS    . NASAL SEPTUM SURGERY  [address]   . OOPHORECTOMY    . PACEMAKER INSERTION     5 different devices   Family History  Problem Relation Age of Onset  . Heart disease Mother 41       A Fib, CHF  . Stroke Mother   . Heart disease Father   . Stroke Father   . Seizures Brother   . Seizures Daughter     Allergies: Vancomycin; Penicillins; and Sulfa antibiotics Current Outpatient Medications on File Prior to Visit  Medication Sig Dispense Refill  . aspirin EC 81 MG tablet Take 81 mg by mouth daily.    . Calcium Carbonate-Vitamin D (CALCIUM-VITAMIN D) 500-200 MG-UNIT tablet Take 1 tablet by mouth daily.    . Cranberry 1000 MG CAPS Take by mouth.    . docusate sodium (COLACE) 250 MG capsule Take 250 mg by mouth 2 (two) times daily.    . famotidine (PEPCID) 20 MG tablet Take 1 tablet (20 mg total) by mouth 2 (two) times daily. 60 tablet 0  . fexofenadine (ALLEGRA) 180 MG tablet Take 180 mg by mouth daily as needed for  allergies or rhinitis.     . fluticasone (FLONASE) 50 MCG/ACT nasal spray USE 1 SPRAY IN EACH NOSTRIL DAILY 16 g 5  . montelukast (SINGULAIR) 10 MG tablet TAKE 1 TABLET BY MOUTH AT BEDTIME. 90 tablet 1  . omeprazole (PRILOSEC) 40 MG capsule Take 40 mg by mouth 2 (two) times daily.    . ondansetron (ZOFRAN) 8 MG tablet TAKE 1 TABLET BY MOUTH EVERY 8 HOURS AS NEEDED FOR NAUSEA OR VOMITING. 20 tablet 0  . predniSONE (DELTASONE) 10 MG tablet Take 3 tablets (30 mg total) by mouth daily with breakfast. 9 tablet 0  . senna (SENOKOT) 8.6 MG tablet Take 1 tablet by mouth as needed for constipation.    . sotalol (BETAPACE) 120 MG tablet TAKE 1 TABLET BY MOUTH 2 TIMES DAILY. 180 tablet 3   No current facility-administered medications on file prior to visit.     Social History   Tobacco  Use  . Smoking status: Former Smoker    Types: Cigarettes    Last attempt to quit: 06/04/1995    Years since quitting: 22.1  . Smokeless tobacco: Never Used  Substance Use Topics  . Alcohol use: Yes    Alcohol/week: 0.0 oz    Comment: occasional  . Drug use: No    Review of Systems  Constitutional: Negative for chills and fever.  HENT: Positive for ear pain. Negative for congestion, ear discharge, facial swelling, sinus pain and sore throat.   Respiratory: Negative for cough, shortness of breath and wheezing.   Cardiovascular: Negative for chest pain and palpitations.  Gastrointestinal: Negative for nausea and vomiting.  Musculoskeletal: Positive for arthralgias.      Objective:    BP 112/62   Pulse 77   Temp (!) 97.5 F (36.4 C) (Oral)   Ht 5\' 7"  (1.702 m)   Wt 149 lb 9.6 oz (67.9 kg)   SpO2 99%   BMI 23.43 kg/m    Physical Exam  Constitutional: She appears well-developed and well-nourished.  HENT:  Head: Normocephalic and atraumatic.  Right Ear: Hearing, tympanic membrane, external ear and ear canal normal. No drainage, swelling or tenderness. No foreign bodies. Tympanic membrane is not erythematous and not bulging. No middle ear effusion. No decreased hearing is noted.  Left Ear: Hearing, tympanic membrane, external ear and ear canal normal. No drainage, swelling or tenderness. No foreign bodies. Tympanic membrane is not erythematous and not bulging.  No middle ear effusion. No decreased hearing is noted.  Nose: Nose normal. No rhinorrhea. Right sinus exhibits no maxillary sinus tenderness and no frontal sinus tenderness. Left sinus exhibits no maxillary sinus tenderness and no frontal sinus tenderness.  Mouth/Throat: Uvula is midline, oropharynx is clear and moist and mucous membranes are normal. No oropharyngeal exudate, posterior oropharyngeal edema, posterior oropharyngeal erythema or tonsillar abscesses.  Eyes: Conjunctivae are normal.  Cardiovascular: Regular  rhythm, normal heart sounds and normal pulses.  Pulmonary/Chest: Effort normal and breath sounds normal. She has no wheezes. She has no rhonchi. She has no rales.  Lymphadenopathy:       Head (right side): No submental, no submandibular, no tonsillar, no preauricular, no posterior auricular and no occipital adenopathy present.       Head (left side): No submental, no submandibular, no tonsillar, no preauricular, no posterior auricular and no occipital adenopathy present.    She has no cervical adenopathy.  Neurological: She is alert.  Skin: Skin is warm and dry.  Psychiatric: She has a normal mood and affect. Her  speech is normal and behavior is normal. Thought content normal.  Vitals reviewed.      Assessment & Plan:   Problem List Items Addressed This Visit      Digestive   Irritable bowel syndrome with constipation - Primary    Unchanged. UTD colonoscopy/EDG. Refilled bentyl as works well for intermittent cramping.       Relevant Medications   dicyclomine (BENTYL) 20 MG tablet     Nervous and Auditory   Eustachian tube disorder, left    Benign exam. Advised patient working diagnosis of eustachian tube dysfunction. Patient already on antihistamine, Flonase. She declines a short burst of prednisone at this time. She will let us know if worsens or continues.        Musculoskeletal and Integument   Polyarthritis    Reviewed polyarthritis evaluation per PCP 2015. Working diagnosis osteoarthritis. Trial of cymbalta as NSAIDs likely worsen IBS and certainly gastritis. Follow up 6 weeks to ensure improvement and the need to pursue further lab evaluation or consult with rheumatology. H/o raynauds.             I have changed Eesha A. Hands's dicyclomine. I am also having her start on DULoxetine. Additionally, I am having her maintain her docusate sodium, fexofenadine, calcium-vitamin D, Cranberry, famotidine, aspirin EC, senna, omeprazole, predniSONE, sotalol, fluticasone,  montelukast, and ondansetron.   Meds ordered this encounter  Medications  . dicyclomine (BENTYL) 20 MG tablet    Sig: Take 1 tablet (20 mg total) 3 (three) times daily as needed by mouth for spasms.    Dispense:  30 tablet    Refill:  1  . DULoxetine (CYMBALTA) 30 MG capsule    Sig: Take one 30 mg tablet by mouth once a day for the first week. Then increase to two 30 mg tablets ( total 60mg ) by mouth once daily.    Dispense:  60 capsule    Refill:  3    Order Specific Question:   Supervising Provider    Answer:   [name] [[id number]]    Return precautions given.   Risks, benefits, and alternatives of the medications and treatment plan prescribed today were discussed, and patient expressed understanding.   Education regarding symptom management and diagnosis given to patient on AVS.  Continue to follow with [name], MD for routine health maintenance.   Nicole Swanson and I agreed with plan.   Mable Paris, FNP

## 2017-07-08 NOTE — Assessment & Plan Note (Signed)
Unchanged. UTD colonoscopy/EDG. Refilled bentyl as works well for intermittent cramping.

## 2017-07-08 NOTE — Progress Notes (Signed)
Pre visit review using our clinic review tool, if applicable. No additional management support is needed unless otherwise documented below in the visit note. 

## 2017-07-08 NOTE — Assessment & Plan Note (Signed)
Benign exam. Advised patient working diagnosis of eustachian tube dysfunction. Patient already on antihistamine, Flonase. She declines a short burst of prednisone at this time. She will let us know if worsens or continues.

## 2017-07-08 NOTE — Patient Instructions (Addendum)
Let me know if ear ache continues  Trial of cymbalta- follow up in 8 weeks with [place] to see if helping.   Nice seeing you!

## 2017-07-08 NOTE — Assessment & Plan Note (Addendum)
Reviewed polyarthritis evaluation per PCP 2015. Working diagnosis osteoarthritis. Trial of cymbalta as NSAIDs likely worsen IBS and certainly gastritis. Follow up 6 weeks to ensure improvement and the need to pursue further lab evaluation or consult with rheumatology. H/o raynauds.

## 2017-07-30 ENCOUNTER — Ambulatory Visit (INDEPENDENT_AMBULATORY_CARE_PROVIDER_SITE_OTHER): Payer: 59 | Admitting: Internal Medicine

## 2017-07-30 ENCOUNTER — Ambulatory Visit (INDEPENDENT_AMBULATORY_CARE_PROVIDER_SITE_OTHER): Payer: 59 | Admitting: *Deleted

## 2017-07-30 ENCOUNTER — Encounter: Payer: Self-pay | Admitting: Internal Medicine

## 2017-07-30 VITALS — BP 120/62 | HR 83 | Ht 67.0 in | Wt 145.0 lb

## 2017-07-30 DIAGNOSIS — Z79899 Other long term (current) drug therapy: Secondary | ICD-10-CM | POA: Diagnosis not present

## 2017-07-30 DIAGNOSIS — I469 Cardiac arrest, cause unspecified: Secondary | ICD-10-CM

## 2017-07-30 NOTE — Patient Instructions (Addendum)
Medication Instructions:  Your physician recommends that you continue on your current medications as directed. Please refer to the Current Medication list given to you today.   Labwork: BMET today  Testing/Procedures: none  Follow-Up: Your physician recommends that you schedule a follow-up appointment in: 10 weeks with Dr. [name]   Any Other Special Instructions Will Be Listed Below (If Applicable).     If you need a refill on your cardiac medications before your next appointment, please call your pharmacy.

## 2017-07-30 NOTE — Progress Notes (Signed)
Patient Care Team: [name], MD as PCP - General (Internal Medicine)   HPI  Nicole Swanson is a 61 y.o. female Seen in followup for ICD implanted for aborted cardiac arrest. Her care initially had been at [facility]. She is status post dual chamber ICD implantation and generator replacement was recently done by me summer 2013   Echocardiogram 2011 demonstrated normal left ventricular function valve function and dimension sizes   She also has a history of ventricular ectopy for which she has been on sotalol in which Dr. Sharon Seller was going to increase prior to her transferring her care to [facility]   Her husband died 70 .    She has stable DOE  She continues to complain of palpitations awaken her at night.  she obtained the AliveCor monitor but has set up prior to activating it.  He seemed to becoming increasingly problematic  Oh  Date Cr Mg K  4/15  0.9 2.4    10/15  0.9   4.5  11/17 0.7  3.9  2/18 0.64  4.0  5/18 0.79 2.5 4.5   Device History: MDT dual chamber ICD implanted 1997 for aborted cardiac arrest; gen change 2007; gen change 2013 History of appropriate therapy: Yes History of AAD therapy: Yes - sotalol  DATE TEST    10/17    Myoview   EF 65 % No ischemia  10/17 Echo EF 65%      Past Medical History:  Diagnosis Date  . Aborted cardiac arrest 1994  . AICD (automatic cardioverter/defibrillator) present   . Allergy   . Anginal pain ([place])   . Arthritis   . Asthma    hx of asthma related to air bag release   . Dual implantable cardiac defibrillator    Medtronic initially implanted in 1997  . DVT (deep vein thrombosis) in pregnancy ([place]) 1986   had heparin shots followed by coumadin  . Family history of seizures    Question neurological versus cardiac  . Heart murmur   . Irritable bowel syndrome   . 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 PONV (postoperative nausea and vomiting)   . Presence of permanent cardiac pacemaker     . PVC's (premature ventricular contractions)   . Raynaud's syndrome   . Shortness of breath dyspnea    with exertion   . Ventricular tachycardia [facility])     Past Surgical History:  Procedure Laterality Date  . ABDOMINAL HYSTERECTOMY    . APPENDECTOMY    . CARDIAC DEFIBRILLATOR PLACEMENT  1994   Dual chamber with pacemaker  . CARPAL TUNNEL RELEASE Right   . COLONOSCOPY WITH PROPOFOL N/A 01/18/2017   Procedure: COLONOSCOPY WITH PROPOFOL;  Surgeon: Manya Silvas, MD;  Location: [facility] ENDOSCOPY;  Service: Endoscopy;  Laterality: N/A;  . CYSTOCELE REPAIR N/A 06/19/2016   Procedure: ANTERIOR REPAIR (CYSTOCELE) WITH VAULT PROLAPSE;  Surgeon: Bjorn Loser, MD;  Location: [facility] ORS;  Service: Urology;  Laterality: N/A;  . CYSTOSCOPY N/A 06/19/2016   Procedure: CYSTOSCOPY;  Surgeon: Bjorn Loser, MD;  Location: [facility] ORS;  Service: Urology;  Laterality: N/A;  . ESOPHAGOGASTRODUODENOSCOPY (EGD) WITH PROPOFOL N/A 01/18/2017   Procedure: ESOPHAGOGASTRODUODENOSCOPY (EGD) WITH PROPOFOL;  Surgeon: Manya Silvas, MD;  Location: [facility] ENDOSCOPY;  Service: Endoscopy;  Laterality: N/A;  . LAPAROSCOPIC LYSIS OF ADHESIONS    . NASAL SEPTUM SURGERY  [address]   . OOPHORECTOMY    . PACEMAKER INSERTION  5 different devices    Current Outpatient Medications  Medication Sig Dispense Refill  . aspirin EC 81 MG tablet Take 81 mg by mouth daily.    . Calcium Carbonate-Vitamin D (CALCIUM-VITAMIN D) 500-200 MG-UNIT tablet Take 1 tablet by mouth daily.    . Cranberry 1000 MG CAPS Take by mouth.    . dicyclomine (BENTYL) 20 MG tablet Take 1 tablet (20 mg total) 3 (three) times daily as needed by mouth for spasms. 30 tablet 1  . docusate sodium (COLACE) 250 MG capsule Take 250 mg by mouth 2 (two) times daily.    . DULoxetine (CYMBALTA) 30 MG capsule Take one 30 mg tablet by mouth once a day for the first week. Then increase to two 30 mg tablets ( total 60mg ) by mouth once daily. 60 capsule 3   . famotidine (PEPCID) 20 MG tablet Take 1 tablet (20 mg total) by mouth 2 (two) times daily. 60 tablet 0  . fexofenadine (ALLEGRA) 180 MG tablet Take 180 mg by mouth daily as needed for allergies or rhinitis.     . fluticasone (FLONASE) 50 MCG/ACT nasal spray USE 1 SPRAY IN EACH NOSTRIL DAILY 16 g 5  . montelukast (SINGULAIR) 10 MG tablet TAKE 1 TABLET BY MOUTH AT BEDTIME. 90 tablet 1  . omeprazole (PRILOSEC) 40 MG capsule Take 40 mg by mouth 2 (two) times daily.    . ondansetron (ZOFRAN) 8 MG tablet TAKE 1 TABLET BY MOUTH EVERY 8 HOURS AS NEEDED FOR NAUSEA OR VOMITING. 20 tablet 0  . senna (SENOKOT) 8.6 MG tablet Take 1 tablet by mouth as needed for constipation.    . sotalol (BETAPACE) 120 MG tablet TAKE 1 TABLET BY MOUTH 2 TIMES DAILY. 180 tablet 3   No current facility-administered medications for this visit.     Allergies  Allergen Reactions  . Vancomycin Rash    Other Reaction: RASH & WELTS  . Penicillins Rash    Has patient had a PCN reaction causing immediate rash, facial/tongue/throat swelling, SOB or lightheadedness with hypotension: No Has patient had a PCN reaction causing severe rash involving mucus membranes or skin necrosis: No Has patient had a PCN reaction that required hospitalization: No Has patient had a PCN reaction occurring within the last 10 years: No If all of the above answers are "NO", then may proceed with Cephalosporin use.   . 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  The information was reviewed. No changes were made in the programming.     No intercurrent ventricular tachycardia  PVC burden is less than 1/h  Ventricular pacing is less than 0.1%.  Potentially either of these at night could be contributing to her palpitations.  We have reviewed using the AliveCor  in a supine position  Change in the pacing rate would have any impact on the above potentially variably i.e. PVCs could be expressed by overdrive pacing in the event that she is ventricularly pacing night that might be alleviated by decreasing the pacing rate.  We will check her surveillance laboratories on sotalol  We spent more than 50% of our >25 min visit in face to face counseling regarding the above

## 2017-07-31 LAB — BASIC METABOLIC PANEL
BUN/Creatinine Ratio: 13 (ref 12–28)
BUN: 12 mg/dL (ref 8–27)
CALCIUM: 9.6 mg/dL (ref 8.7–10.3)
CO2: 24 mmol/L (ref 20–29)
CREATININE: 0.92 mg/dL (ref 0.57–1.00)
Chloride: 101 mmol/L (ref 96–106)
GFR, EST AFRICAN AMERICAN: 77 mL/min/{1.73_m2} (ref >59)
GFR, EST NON AFRICAN AMERICAN: 66 mL/min/{1.73_m2} (ref >59)
Glucose: 127 mg/dL — ABNORMAL HIGH (ref 65–99)
Potassium: 4.3 mmol/L (ref 3.5–5.2)
Sodium: 139 mmol/L (ref 134–144)

## 2017-07-31 NOTE — Progress Notes (Signed)
Remote ICD transmission.   

## 2017-08-01 NOTE — Addendum Note (Signed)
Addended by: Othelia Pulling C on: 08/01/2017 08:00 AM   Modules accepted: Orders

## 2017-08-02 ENCOUNTER — Encounter: Payer: Self-pay | Admitting: Cardiology

## 2017-08-02 LAB — CUP PACEART REMOTE DEVICE CHECK
Battery Voltage: 2.99 V
Brady Statistic AP VP Percent: 0.02 %
Brady Statistic AS VS Percent: 25.66 %
Brady Statistic RA Percent Paced: 74.31 %
Brady Statistic RV Percent Paced: 0.05 %
Date Time Interrogation Session: 20181127094229
HIGH POWER IMPEDANCE MEASURED VALUE: 456 Ohm
HIGH POWER IMPEDANCE MEASURED VALUE: 52 Ohm
HIGH POWER IMPEDANCE MEASURED VALUE: 68 Ohm
Implantable Lead Implant Date: 19970917
Implantable Lead Location: 753859
Implantable Lead Model: 5076
Implantable Pulse Generator Implant Date: 20130722
Lead Channel Impedance Value: 437 Ohm
Lead Channel Impedance Value: 456 Ohm
Lead Channel Pacing Threshold Amplitude: 0.625 V
Lead Channel Pacing Threshold Pulse Width: 0.4 ms
Lead Channel Sensing Intrinsic Amplitude: 11.75 mV
Lead Channel Sensing Intrinsic Amplitude: 11.75 mV
Lead Channel Setting Pacing Amplitude: 2 V
Lead Channel Setting Sensing Sensitivity: 0.45 mV
MDC IDC LEAD IMPLANT DT: 20070119
MDC IDC LEAD LOCATION: 753860
MDC IDC MSMT LEADCHNL RA SENSING INTR AMPL: 2.125 mV
MDC IDC MSMT LEADCHNL RA SENSING INTR AMPL: 2.125 mV
MDC IDC MSMT LEADCHNL RV PACING THRESHOLD AMPLITUDE: 0.75 V
MDC IDC MSMT LEADCHNL RV PACING THRESHOLD PULSEWIDTH: 0.4 ms
MDC IDC SET LEADCHNL RV PACING AMPLITUDE: 2.5 V
MDC IDC SET LEADCHNL RV PACING PULSEWIDTH: 0.4 ms
MDC IDC STAT BRADY AP VS PERCENT: 74.29 %
MDC IDC STAT BRADY AS VP PERCENT: 0.03 %

## 2017-08-06 ENCOUNTER — Telehealth: Payer: Self-pay

## 2017-08-06 NOTE — Telephone Encounter (Signed)
-----   Message from Deboraha Sprang, MD sent at 08/04/2017 11:51 AM EST ----- Please Inform Patient that labs are normal # Thanks

## 2017-08-06 NOTE — Telephone Encounter (Signed)
Pt it aware and agreeable to normal results

## 2017-08-20 LAB — CUP PACEART INCLINIC DEVICE CHECK
Battery Voltage: 2.99 V
Brady Statistic AP VP Percent: 0.02 %
Brady Statistic AS VS Percent: 28.87 %
Brady Statistic RV Percent Paced: 0.05 %
Date Time Interrogation Session: 20181127180421
HIGH POWER IMPEDANCE MEASURED VALUE: 494 Ohm
HIGH POWER IMPEDANCE MEASURED VALUE: 54 Ohm
HighPow Impedance: 81 Ohm
Implantable Lead Implant Date: 19970917
Implantable Lead Location: 753859
Implantable Lead Location: 753860
Implantable Lead Model: 5076
Implantable Lead Model: 6942
Implantable Pulse Generator Implant Date: 20130722
Lead Channel Impedance Value: 456 Ohm
Lead Channel Impedance Value: 494 Ohm
Lead Channel Pacing Threshold Amplitude: 0.75 V
Lead Channel Pacing Threshold Pulse Width: 0.4 ms
Lead Channel Sensing Intrinsic Amplitude: 12.75 mV
Lead Channel Sensing Intrinsic Amplitude: 2.125 mV
MDC IDC LEAD IMPLANT DT: 20070119
MDC IDC MSMT LEADCHNL RV PACING THRESHOLD AMPLITUDE: 0.75 V
MDC IDC MSMT LEADCHNL RV PACING THRESHOLD PULSEWIDTH: 0.4 ms
MDC IDC SET LEADCHNL RA PACING AMPLITUDE: 2 V
MDC IDC SET LEADCHNL RV PACING AMPLITUDE: 2.5 V
MDC IDC SET LEADCHNL RV PACING PULSEWIDTH: 0.4 ms
MDC IDC SET LEADCHNL RV SENSING SENSITIVITY: 0.45 mV
MDC IDC STAT BRADY AP VS PERCENT: 71.08 %
MDC IDC STAT BRADY AS VP PERCENT: 0.03 %
MDC IDC STAT BRADY RA PERCENT PACED: 71.09 %

## 2017-10-08 ENCOUNTER — Ambulatory Visit (INDEPENDENT_AMBULATORY_CARE_PROVIDER_SITE_OTHER): Payer: 59 | Admitting: Internal Medicine

## 2017-10-08 ENCOUNTER — Encounter: Payer: Self-pay | Admitting: Internal Medicine

## 2017-10-08 VITALS — BP 110/80 | HR 65 | Ht 67.0 in | Wt 151.5 lb

## 2017-10-08 DIAGNOSIS — I493 Ventricular premature depolarization: Secondary | ICD-10-CM | POA: Diagnosis not present

## 2017-10-08 DIAGNOSIS — I469 Cardiac arrest, cause unspecified: Secondary | ICD-10-CM

## 2017-10-08 DIAGNOSIS — Z79899 Other long term (current) drug therapy: Secondary | ICD-10-CM

## 2017-10-08 DIAGNOSIS — Z95810 Presence of automatic (implantable) cardiac defibrillator: Secondary | ICD-10-CM | POA: Diagnosis not present

## 2017-10-08 NOTE — Progress Notes (Signed)
Patient Care Team: [name], MD as PCP - General (Internal Medicine)   HPI  Nicole Swanson is a 62 y.o. female Seen in followup for ICD implanted for aborted cardiac arrest. Her care initially had been at [facility]. She is status post dual chamber ICD implantation and generator replacement was recently done by me summer 2013   Echocardiogram 2011 demonstrated normal left ventricular function valve function and dimension sizes   She also has a history of ventricular ectopy for which she has been on sotalol in which Dr. Sharon Seller was going to increase prior to her transferring her care to [facility]   Her husband died 73 .     She continues to have palpitations at night.  We hoped to use the AliveCor monitor to determine what these were; however, by the time she rolls onto her right side they are gone.  She continues with chronic dyspnea on exertion.  She is not fit.  does not have peripheral edema.  She has vague non- exercise associated chest pains typically lasting just seconds  Date Cr Mg K  4/15  0.9 2.4    10/15  0.9   4.5  11/17 0.7  3.9  2/18 0.64  4.0  5/18 0.79 2.5 4.5  11/18 0.92  4.3   Device History: MDT dual chamber ICD implanted 1997 for aborted cardiac arrest; gen change 2007; gen change 2013 History of appropriate therapy: Yes History of AAD therapy: Yes - sotalol  DATE TEST    10/17    Myoview   EF 65 % No ischemia  10/17 Echo EF 65%      Past Medical History:  Diagnosis Date  . Aborted cardiac arrest 1994  . AICD (automatic cardioverter/defibrillator) present   . Allergy   . Anginal pain ([place])   . Arthritis   . Asthma    hx of asthma related to air bag release   . Dual implantable cardiac defibrillator    Medtronic initially implanted in 1997  . DVT (deep vein thrombosis) in pregnancy ([place]) 1986   had heparin shots followed by coumadin  . Family history of seizures    Question neurological versus cardiac  . Heart murmur   .  Irritable bowel syndrome   . 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 PONV (postoperative nausea and vomiting)   . Presence of permanent cardiac pacemaker   . PVC's (premature ventricular contractions)   . Raynaud's syndrome   . Shortness of breath dyspnea    with exertion   . Ventricular tachycardia [facility])     Past Surgical History:  Procedure Laterality Date  . ABDOMINAL HYSTERECTOMY    . APPENDECTOMY    . CARDIAC DEFIBRILLATOR PLACEMENT  1994   Dual chamber with pacemaker  . CARPAL TUNNEL RELEASE Right   . COLONOSCOPY WITH PROPOFOL N/A 01/18/2017   Procedure: COLONOSCOPY WITH PROPOFOL;  Surgeon: Manya Silvas, MD;  Location[facility] ENDOSCOPY;  Service: Endoscopy;  Laterality: N/A;  . CYSTOCELE REPAIR N/A 06/19/2016   Procedure: ANTERIOR REPAIR (CYSTOCELE) WITH VAULT PROLAPSE;  Surgeon: Bjorn Loser, MD;  Location: [facility]S;  Service: Urology;  Laterality: N/A;  . CYSTOSCOPY N/A 06/19/2016   Procedure: CYSTOSCOPY;  Surgeon: Bjorn Loser, MD;  Location: [facility] ORS;  Service: Urology;  Laterality: N/A;  . ESOPHAGOGASTRODUODENOSCOPY (EGD) WITH PROPOFOL N/A 01/18/2017   Procedure: ESOPHAGOGASTRODUODENOSCOPY (EGD) WITH PROPOFOL;  Surgeon: Manya Silvas, MD;  Location: [facility] ENDOSCOPY;  Service: Endoscopy;  Laterality: N/A;  . LAPAROSCOPIC LYSIS OF ADHESIONS    . NASAL SEPTUM SURGERY  [address]   . OOPHORECTOMY    . PACEMAKER INSERTION     5 different devices    Current Outpatient Medications  Medication Sig Dispense Refill  . aspirin EC 81 MG tablet Take 81 mg by mouth daily.    . Calcium Carbonate-Vitamin D (CALCIUM-VITAMIN D) 500-200 MG-UNIT tablet Take 1 tablet by mouth daily.    . Cranberry 1000 MG CAPS Take by mouth.    . dicyclomine (BENTYL) 20 MG tablet Take 1 tablet (20 mg total) 3 (three) times daily as needed by mouth for spasms. 30 tablet 1  . docusate sodium (COLACE) 250 MG capsule Take 250 mg by mouth 2 (two) times daily.    . DULoxetine  (CYMBALTA) 30 MG capsule Take one 30 mg tablet by mouth once a day for the first week. Then increase to two 30 mg tablets ( total 60mg ) by mouth once daily. 60 capsule 3  . famotidine (PEPCID) 20 MG tablet Take 1 tablet (20 mg total) by mouth 2 (two) times daily. 60 tablet 0  . fexofenadine (ALLEGRA) 180 MG tablet Take 180 mg by mouth daily as needed for allergies or rhinitis.     . fluticasone (FLONASE) 50 MCG/ACT nasal spray USE 1 SPRAY IN EACH NOSTRIL DAILY 16 g 5  . montelukast (SINGULAIR) 10 MG tablet TAKE 1 TABLET BY MOUTH AT BEDTIME. 90 tablet 1  . omeprazole (PRILOSEC) 40 MG capsule Take 40 mg by mouth 2 (two) times daily.    . ondansetron (ZOFRAN) 8 MG tablet TAKE 1 TABLET BY MOUTH EVERY 8 HOURS AS NEEDED FOR NAUSEA OR VOMITING. 20 tablet 0  . senna (SENOKOT) 8.6 MG tablet Take 1 tablet by mouth as needed for constipation.    . sotalol (BETAPACE) 120 MG tablet TAKE 1 TABLET BY MOUTH 2 TIMES DAILY. 180 tablet 3   No current facility-administered medications for this visit.     Allergies  Allergen Reactions  . Vancomycin Rash    Other Reaction: RASH & WELTS  . Penicillins Rash    Has patient had a PCN reaction causing immediate rash, facial/tongue/throat swelling, SOB or lightheadedness with hypotension: No Has patient had a PCN reaction causing severe rash involving mucus membranes or skin necrosis: No Has patient had a PCN reaction that required hospitalization: No Has patient had a PCN reaction occurring within the last 10 years: No If all of the above answers are "NO", then may proceed with Cephalosporin use.   . Sulfa Antibiotics Diarrhea    Review of Systems negative except from HPI and PMH  Physical Exam BP 110/80 (BP Location: Left Arm, Patient Position: Sitting, Cuff Size: Normal)   Pulse 65   Ht 5\' 7"  (1.702 m)   Wt 151 lb 8 oz (68.7 kg)   BMI 23.73 kg/m  Well developed and nourished in no acute distress HENT normal Neck supple with JVP-flat Clear Regular rate  and rhythm, no murmurs or gallops Abd-soft with active BS No Clubbing cyanosis edema Skin-warm and dry A & Oriented  Grossly normal sensory and motor function   ECG atrial paced at 65  21/07/44  Axis +30  T wave inversion V2-V4   assessment and  Plan  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  The information was reviewed. No changes were  made in the programming.    Abnormal ECG  Palpitations continue to be a problem but are momentary and are relieved by turning over in bed.  They may well be PVCs  ECG continues to revolve T wave inversions over the last year.  Signal-averaged ECG might be appropriate as this may be a clue as to the cardiomyopathy triggered her cardiac arrest  Pressures well controlled  Encouraged exercise   Tolerating sotalol  We spent more than 50% of our >25 min visit in face to face counseling regarding the above

## 2017-10-08 NOTE — Patient Instructions (Addendum)
Medication Instructions:  Your physician recommends that you continue on your current medications as directed. Please refer to the Current Medication list given to you today.  Labwork: None ordered.  Testing/Procedures: None ordered.  Follow-Up: Your physician recommends that you schedule a follow-up appointment in: 6 months with Dr [name]  Remote monitoring is used to monitor your ICD from home. This monitoring reduces the number of office visits required to check your device to one time per year. It allows [place] to keep an eye on the functioning of your device to ensure it is working properly. You are scheduled for a device check from home on 10/29/2017. You may send your transmission at any time that day. If you have a wireless device, the transmission will be sent automatically. After your physician reviews your transmission, you will receive a postcard with your next transmission date.   Any Other Special Instructions Will Be Listed Below (If Applicable).     If you need a refill on your cardiac medications before your next appointment, please call your pharmacy.

## 2017-10-14 ENCOUNTER — Ambulatory Visit (INDEPENDENT_AMBULATORY_CARE_PROVIDER_SITE_OTHER): Payer: 59

## 2017-10-14 ENCOUNTER — Telehealth: Payer: Self-pay | Admitting: Internal Medicine

## 2017-10-14 ENCOUNTER — Ambulatory Visit (INDEPENDENT_AMBULATORY_CARE_PROVIDER_SITE_OTHER): Payer: 59 | Admitting: Internal Medicine

## 2017-10-14 ENCOUNTER — Encounter: Payer: Self-pay | Admitting: Internal Medicine

## 2017-10-14 VITALS — BP 138/64 | HR 82 | Temp 97.6°F | Ht 67.0 in | Wt 153.8 lb

## 2017-10-14 DIAGNOSIS — R269 Unspecified abnormalities of gait and mobility: Secondary | ICD-10-CM | POA: Diagnosis not present

## 2017-10-14 DIAGNOSIS — R1012 Left upper quadrant pain: Secondary | ICD-10-CM | POA: Diagnosis not present

## 2017-10-14 DIAGNOSIS — S299XXA Unspecified injury of thorax, initial encounter: Secondary | ICD-10-CM | POA: Diagnosis not present

## 2017-10-14 DIAGNOSIS — R0781 Pleurodynia: Secondary | ICD-10-CM

## 2017-10-14 MED ORDER — ONDANSETRON HCL 4 MG PO TABS: 4.0000 mg | ORAL_TABLET | Freq: Three times a day (TID) | ORAL | 0 refills | Status: DC | PRN

## 2017-10-14 MED ORDER — OXYCODONE-ACETAMINOPHEN 5-325 MG PO TABS: 1.0000 | ORAL_TABLET | Freq: Three times a day (TID) | ORAL | 0 refills | Status: DC | PRN

## 2017-10-14 NOTE — Progress Notes (Signed)
Pre visit review using our clinic review tool, if applicable. No additional management support is needed unless otherwise documented below in the visit note. 

## 2017-10-14 NOTE — Patient Instructions (Addendum)
Please f/u in 2 weeks if not better sooner if needed  F/u 01/2018 for regular check up   Rib Fracture A rib fracture is a break or crack in one of the bones of the ribs. The ribs are a group of long, curved bones that wrap around your chest and attach to your spine. They protect your lungs and other organs in the chest cavity. A broken or cracked rib is often painful, but most do not cause other problems. Most rib fractures heal on their own over time. However, rib fractures can be more serious if multiple ribs are broken or if broken ribs move out of place and push against other structures. What are the causes?  A direct blow to the chest. For example, this could happen during contact sports, a car accident, or a fall against a hard object.  Repetitive movements with high force, such as pitching a baseball or having severe coughing spells. What are the signs or symptoms?  Pain when you breathe in or cough.  Pain when someone presses on the injured area. How is this diagnosed? Your caregiver will perform a physical exam. Various imaging tests may be ordered to confirm the diagnosis and to look for related injuries. These tests may include a chest X-ray, computed tomography (CT), magnetic resonance imaging (MRI), or a bone scan. How is this treated? Rib fractures usually heal on their own in 1-3 months. The longer healing period is often associated with a continued cough or other aggravating activities. During the healing period, pain control is very important. Medication is usually given to control pain. Hospitalization or surgery may be needed for more severe injuries, such as those in which multiple ribs are broken or the ribs have moved out of place. Follow these instructions at home:  Avoid strenuous activity and any activities or movements that cause pain. Be careful during activities and avoid bumping the injured rib.  Gradually increase activity as directed by your caregiver.  Only  take over-the-counter or prescription medications as directed by your caregiver. Do not take other medications without asking your caregiver first.  Apply ice to the injured area for the first 1-2 days after you have been treated or as directed by your caregiver. Applying ice helps to reduce inflammation and pain. ? Put ice in a plastic bag. ? Place a towel between your skin and the bag. ? Leave the ice on for 15-20 minutes at a time, every 2 hours while you are awake.  Perform deep breathing as directed by your caregiver. This will help prevent pneumonia, which is a common complication of a broken rib. Your caregiver may instruct you to: ? Take deep breaths several times a day. ? Try to cough several times a day, holding a pillow against the injured area. ? Use a device called an incentive spirometer to practice deep breathing several times a day.  Drink enough fluids to keep your urine clear or pale yellow. This will help you avoid constipation.  Do not wear a rib belt or binder. These restrict breathing, which can lead to pneumonia. Get help right away if:  You have a fever.  You have difficulty breathing or shortness of breath.  You develop a continual cough, or you cough up thick or bloody sputum.  You feel sick to your stomach (nausea), throw up (vomit), or have abdominal pain.  You have worsening pain not controlled with medications. This information is not intended to replace advice given to you by your  health care provider. Make sure you discuss any questions you have with your health care provider. Document Released: 08/20/2005 Document Revised: 01/26/2016 Document Reviewed: 10/22/2012 Elsevier Interactive Patient Education  [employer].

## 2017-10-14 NOTE — Telephone Encounter (Signed)
Patient prescription cannot be filled [facility] employee pharmacy stated provider DEA expired.

## 2017-10-15 ENCOUNTER — Encounter: Payer: Self-pay | Admitting: Internal Medicine

## 2017-10-15 ENCOUNTER — Other Ambulatory Visit: Payer: Self-pay | Admitting: Internal Medicine

## 2017-10-15 DIAGNOSIS — R0781 Pleurodynia: Secondary | ICD-10-CM

## 2017-10-15 NOTE — Telephone Encounter (Signed)
Spoke with pharmacy. This was resent yesterday with no problems with DEA.

## 2017-10-15 NOTE — Progress Notes (Signed)
Chief Complaint  Patient presents with  . Fall    pleuritic chest pain, left rib pain    Acute visit c/o fall 10/11/17 walking sons dogs and large dogs pull her and she fell on left side when feet slid from underneath her she hit left shoulder, left hip, left side of body. C/o pleuritic chest pain with taking deep breath in and out. Pain also worse with walking pain 6-7/10 today moving it is 8-10/10. She tried Ibuprofen and Tylenol for pain. Hip pain is slightly better after heating pain.  She reports she fell on concrete.  She can take narcotics but gets nauseated so would like medication for nausea   Review of Systems  Constitutional: Negative for weight loss.  HENT: Negative for hearing loss.   Respiratory: Negative for shortness of breath.   Cardiovascular: Positive for chest pain.       Pleuritic chest pain   Gastrointestinal: Positive for abdominal pain.  Musculoskeletal: Positive for falls, joint pain and myalgias.  Skin:       Bruising to left hip   Neurological: Negative for loss of consciousness.  Psychiatric/Behavioral: Negative for memory loss.   Past Medical History:  Diagnosis Date  . Aborted cardiac arrest 1994  . AICD (automatic cardioverter/defibrillator) present   . Allergy   . Anginal pain ([place])   . Arthritis   . Asthma    hx of asthma related to air bag release   . Dual implantable cardiac defibrillator    [employer] initially implanted in 1997  . DVT (deep vein thrombosis) in pregnancy ([place]) 1986   had heparin shots followed by coumadin  . Fall    10/11/17 walking dogs   . Family history of seizures    Question neurological versus cardiac  . Heart murmur   . Irritable bowel syndrome   . 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 PONV (postoperative nausea and vomiting)   . Presence of permanent cardiac pacemaker   . PVC's (premature ventricular contractions)   . Raynaud's syndrome   . Shortness of breath dyspnea    with exertion   .  Ventricular tachycardia [facility])    Past Surgical History:  Procedure Laterality Date  . ABDOMINAL HYSTERECTOMY    . APPENDECTOMY    . CARDIAC DEFIBRILLATOR PLACEMENT  1994   Dual chamber with pacemaker  . CARPAL TUNNEL RELEASE Right   . COLONOSCOPY WITH PROPOFOL N/A 01/18/2017   Procedure: COLONOSCOPY WITH PROPOFOL;  Surgeon: Manya Silvas, MD;  Location[facility] ENDOSCOPY;  Service: Endoscopy;  Laterality: N/A;  . CYSTOCELE REPAIR N/A 06/19/2016   Procedure: ANTERIOR REPAIR (CYSTOCELE) WITH VAULT PROLAPSE;  Surgeon: Bjorn Loser, MD;  Location: [facility] ORS;  Service: Urology;  Laterality: N/A;  . CYSTOSCOPY N/A 06/19/2016   Procedure: CYSTOSCOPY;  Surgeon: Bjorn Loser, MD;  Location: [facility] ORS;  Service: Urology;  Laterality: N/A;  . ESOPHAGOGASTRODUODENOSCOPY (EGD) WITH PROPOFOL N/A 01/18/2017   Procedure: ESOPHAGOGASTRODUODENOSCOPY (EGD) WITH PROPOFOL;  Surgeon: Manya Silvas, MD;  Location: [facility] ENDOSCOPY;  Service: Endoscopy;  Laterality: N/A;  . LAPAROSCOPIC LYSIS OF ADHESIONS    . NASAL SEPTUM SURGERY  [address]   . OOPHORECTOMY    . PACEMAKER INSERTION     5 different devices   Family History  Problem Relation Age of Onset  . Heart disease Mother 10       A Fib, CHF  . Stroke Mother   . Heart disease Father   . Stroke  Father   . Seizures Brother   . Seizures Daughter    Social History   Socioeconomic History  . Marital status: Widowed    Spouse name: Not on file  . Number of children: Not on file  . Years of education: Not on file  . Highest education level: Not on file  Social Needs  . Financial resource strain: Not on file  . Food insecurity - worry: Not on file  . Food insecurity - inability: Not on file  . Transportation needs - medical: Not on file  . Transportation needs - non-medical: Not on file  Occupational History  . Not on file  Tobacco Use  . Smoking status: Former Smoker    Types: Cigarettes    Last attempt to quit: 06/04/1995     Years since quitting: 22.3  . Smokeless tobacco: Never Used  Substance and Sexual Activity  . Alcohol use: Yes    Alcohol/week: 0.0 oz    Comment: occasional  . Drug use: No  . Sexual activity: Not on file  Other Topics Concern  . Not on file  Social History Narrative   Works [employer]   Widowed    Kids    Current Meds  Medication Sig  . aspirin EC 81 MG tablet Take 81 mg by mouth daily.  . Calcium Carbonate-Vitamin D (CALCIUM-VITAMIN D) 500-200 MG-UNIT tablet Take 1 tablet by mouth daily.  . Cranberry 1000 MG CAPS Take by mouth.  . dicyclomine (BENTYL) 20 MG tablet Take 1 tablet (20 mg total) 3 (three) times daily as needed by mouth for spasms.  Marland Kitchen docusate sodium (COLACE) 250 MG capsule Take 250 mg by mouth 2 (two) times daily.  . DULoxetine (CYMBALTA) 30 MG capsule Take one 30 mg tablet by mouth once a day for the first week. Then increase to two 30 mg tablets ( total 60mg ) by mouth once daily.  . famotidine (PEPCID) 20 MG tablet Take 1 tablet (20 mg total) by mouth 2 (two) times daily.  . fexofenadine (ALLEGRA) 180 MG tablet Take 180 mg by mouth daily as needed for allergies or rhinitis.   . fluticasone (FLONASE) 50 MCG/ACT nasal spray USE 1 SPRAY IN EACH NOSTRIL DAILY  . montelukast (SINGULAIR) 10 MG tablet TAKE 1 TABLET BY MOUTH AT BEDTIME.  Marland Kitchen omeprazole (PRILOSEC) 40 MG capsule Take 40 mg by mouth 2 (two) times daily.  . ondansetron (ZOFRAN) 8 MG tablet TAKE 1 TABLET BY MOUTH EVERY 8 HOURS AS NEEDED FOR NAUSEA OR VOMITING.  . senna (SENOKOT) 8.6 MG tablet Take 1 tablet by mouth as needed for constipation.  . sotalol (BETAPACE) 120 MG tablet TAKE 1 TABLET BY MOUTH 2 TIMES DAILY.   Allergies  Allergen Reactions  . Vancomycin Rash    Other Reaction: RASH & WELTS  . Penicillins Rash    Has patient had a PCN reaction causing immediate rash, facial/tongue/throat swelling, SOB or lightheadedness with hypotension: No Has patient had a PCN reaction causing severe rash involving mucus  membranes or skin necrosis: No Has patient had a PCN reaction that required hospitalization: No Has patient had a PCN reaction occurring within the last 10 years: No If all of the above answers are "NO", then may proceed with Cephalosporin use.   . Sulfa Antibiotics Diarrhea   Recent Results (from the past 2160 hour(s))  CUP PACEART REMOTE DEVICE CHECK     Status: None   Collection Time: 07/30/17  9:42 AM  Result Value Ref Range  Date Time Interrogation Session [phone]    Pulse Generator Manufacturer MERM    Pulse Gen Model [id number] Protecta XT DR    Pulse Gen Serial Number [id number]    Clinic Name [facility]    Implantable Pulse Generator Type Implantable Cardiac Defibulator    Implantable Pulse Generator Implant Date [id number]    Implantable Lead Manufacturer [facility]    Implantable Lead Model 5076 CapSureFix Novus    Implantable Lead Serial Number [id number]    Implantable Lead Implant Date 32992426    Implantable Lead Location [phone]    Implantable Lead Manufacturer [facility]    Implantable Lead Model [phone] Sprint    Implantable Lead Serial Number [id number]    Implantable Lead Implant Date 79892119    Implantable Lead Location [id number]    Lead Channel Setting Sensing Sensitivity 0.45 mV   Lead Channel Setting Pacing Amplitude 2 V   Lead Channel Setting Pacing Pulse Width 0.4 ms   Lead Channel Setting Pacing Amplitude 2.5 V   Lead Channel Impedance Value 437 ohm   Lead Channel Sensing Intrinsic Amplitude 2.125 mV   Lead Channel Sensing Intrinsic Amplitude 2.125 mV   Lead Channel Pacing Threshold Amplitude 0.625 V   Lead Channel Pacing Threshold Pulse Width 0.4 ms   Lead Channel Impedance Value 456 ohm   Lead Channel Sensing Intrinsic Amplitude 11.75 mV   Lead Channel Sensing Intrinsic Amplitude 11.75 mV   Lead Channel Pacing Threshold Amplitude 0.75 V   Lead Channel Pacing Threshold Pulse Width 0.4 ms   HighPow Impedance 456 ohm   HighPow Impedance 52 ohm   HighPow  Impedance 68 ohm   Battery Status OK    Battery Voltage 2.99 V   Brady Statistic RA Percent Paced 74.31 %   Brady Statistic RV Percent Paced 0.05 %   Brady Statistic AP VP Percent 0.02 %   Brady Statistic AS VP Percent 0.03 %   Brady Statistic AP VS Percent 74.29 %   Brady Statistic AS VS Percent 25.66 %   Eval Rhythm AsVs   Basic Metabolic Panel (BMET)     Status: Abnormal   Collection Time: 07/30/17 12:41 PM  Result Value Ref Range   Glucose 127 (H) 65 - 99 mg/dL   BUN 12 8 - 27 mg/dL   Creatinine, Ser 0.92 0.57 - 1.00 mg/dL   GFR calc non Af Amer 66 >59 mL/min/1.73   GFR calc Af Amer 77 >59 mL/min/1.73   BUN/Creatinine Ratio 13 12 - 28   Sodium 139 134 - 144 mmol/L   Potassium 4.3 3.5 - 5.2 mmol/L   Chloride 101 96 - 106 mmol/L   CO2 24 20 - 29 mmol/L   Calcium 9.6 8.7 - 10.3 mg/dL  CUP PACEART INCLINIC DEVICE CHECK     Status: None   Collection Time: 07/30/17  6:04 PM  Result Value Ref Range   Date Time Interrogation Session [phone]    Pulse Generator Manufacturer MERM    Pulse Gen Model D314DRG Protecta XT DR    Pulse Gen Serial Number [id number]    Clinic Name [facility]    Implantable Pulse Generator Type Implantable Cardiac Defibulator    Implantable Pulse Generator Implant Date [id number]    Implantable Lead Manufacturer [facility]    Implantable Lead Model 5076 CapSureFix Novus    Implantable Lead Serial Number [id number]    Implantable Lead Implant Date 37858850    Implantable Lead Location [id number]    Implantable Lead Manufacturer MERM    Implantable  Lead Model [phone] Sprint    Implantable Lead Serial Number [id number]    Implantable Lead Implant Date 81017510    Implantable Lead Location [phone]    Lead Channel Setting Sensing Sensitivity 0.45 mV   Lead Channel Setting Pacing Amplitude 2 V   Lead Channel Setting Pacing Pulse Width 0.4 ms   Lead Channel Setting Pacing Amplitude 2.5 V   Lead Channel Impedance Value 456 ohm   Lead Channel Sensing Intrinsic  Amplitude 2.125 mV   Lead Channel Pacing Threshold Amplitude 0.75 V   Lead Channel Pacing Threshold Pulse Width 0.4 ms   Lead Channel Impedance Value 494 ohm   Lead Channel Sensing Intrinsic Amplitude 12.75 mV   Lead Channel Pacing Threshold Amplitude 0.75 V   Lead Channel Pacing Threshold Pulse Width 0.4 ms   HighPow Impedance 494 ohm   HighPow Impedance 54 ohm   HighPow Impedance 81 ohm   Battery Status OK    Battery Voltage 2.99 V   Brady Statistic RA Percent Paced 71.09 %   Brady Statistic RV Percent Paced 0.05 %   Brady Statistic AP VP Percent 0.02 %   Brady Statistic AS VP Percent 0.03 %   Brady Statistic AP VS Percent 71.08 %   Brady Statistic AS VS Percent 28.87 %   Eval Rhythm SB 51    Objective  Body mass index is 24.09 kg/m. Wt Readings from Last 3 Encounters:  10/14/17 153 lb 12.8 oz (69.8 kg)  10/08/17 151 lb 8 oz (68.7 kg)  07/30/17 145 lb (65.8 kg)   Temp Readings from Last 3 Encounters:  10/14/17 97.6 F (36.4 C) (Oral)  07/08/17 (!) 97.5 F (36.4 C) (Oral)  02/13/17 97.8 F (36.6 C)   BP Readings from Last 3 Encounters:  10/14/17 138/64  10/08/17 110/80  07/30/17 120/62   Pulse Readings from Last 3 Encounters:  10/14/17 82  10/08/17 65  07/30/17 83   O2 sat room air 98%  Physical Exam  Constitutional: She is oriented to person, place, and time and well-developed, well-nourished, and in no distress. Vital signs are normal.  HENT:  Head: Normocephalic and atraumatic.  Mouth/Throat: Oropharynx is clear and moist and mucous membranes are normal.  Eyes: Conjunctivae are normal. Pupils are equal, round, and reactive to light.  Cardiovascular: Normal rate, regular rhythm and normal heart sounds.  Pulmonary/Chest: Effort normal and breath sounds normal.  Abdominal: Soft. Bowel sounds are normal. There is tenderness.    Left Upper ab ttp mild to moderate also along rib cage left   Musculoskeletal: She exhibits tenderness.       Left hip: She  exhibits tenderness.  Left hip ttp on exam   Neurological: She is alert and oriented to person, place, and time. Gait normal. Gait normal.  Skin: Skin is warm, dry and intact.  +left hip bruise   Psychiatric: Mood, memory, affect and judgment normal.  Nursing note and vitals reviewed.   Assessment   1. Pleuritic chest pain s/p fall with left hip pain and left ribcage pain, LUQ ab pain r/o MSK etiology rib fracture vs organ contusion  Plan  1. CXR [facility] with [place] ab complete  Rib Xray left sided negative  Prn Percocet to take with zofran  Given note to be off work today    Provider: Dr. Olivia Mackie McLean-Scocuzza-Internal Medicine

## 2017-10-16 ENCOUNTER — Telehealth: Payer: Self-pay | Admitting: *Deleted

## 2017-10-16 ENCOUNTER — Ambulatory Visit
Admission: RE | Admit: 2017-10-16 | Discharge: 2017-10-16 | Disposition: A | Discharge status: Home / Self Care | Payer: 59 | Source: Ambulatory Visit | Attending: Internal Medicine | Admitting: Internal Medicine

## 2017-10-16 DIAGNOSIS — I700 Atherosclerosis of aorta: Secondary | ICD-10-CM | POA: Insufficient documentation

## 2017-10-16 DIAGNOSIS — R1012 Left upper quadrant pain: Secondary | ICD-10-CM | POA: Diagnosis not present

## 2017-10-16 DIAGNOSIS — R109 Unspecified abdominal pain: Secondary | ICD-10-CM | POA: Diagnosis not present

## 2017-10-16 NOTE — Telephone Encounter (Signed)
[place] called office back and stated no order in for chest X-ray they could not see order on there end so patient did not have chest x-ray.

## 2017-10-16 NOTE — Telephone Encounter (Signed)
Copied from [place] [id number]. Topic: Referral - Question >> Oct 16, 2017  8:23 AM Hewitt Shorts wrote: Reason for CRM: morgan from [facility] is calling to see if patient is also needing a chest xray -she is in the office waiting because she just did an ultrasound   Best number to [facility] is [phone]

## 2017-10-16 NOTE — Telephone Encounter (Signed)
Please advise 

## 2017-10-16 NOTE — Progress Notes (Signed)
Pt is scheduled today to get the [place].

## 2017-10-17 ENCOUNTER — Ambulatory Visit
Admission: RE | Admit: 2017-10-17 | Discharge: 2017-10-17 | Disposition: A | Discharge status: Home / Self Care | Payer: 59 | Source: Ambulatory Visit | Attending: Internal Medicine | Admitting: Internal Medicine

## 2017-10-17 ENCOUNTER — Other Ambulatory Visit: Payer: Self-pay | Admitting: Internal Medicine

## 2017-10-17 DIAGNOSIS — R079 Chest pain, unspecified: Secondary | ICD-10-CM | POA: Diagnosis not present

## 2017-10-17 DIAGNOSIS — Z950 Presence of cardiac pacemaker: Secondary | ICD-10-CM | POA: Insufficient documentation

## 2017-10-17 DIAGNOSIS — R0781 Pleurodynia: Secondary | ICD-10-CM | POA: Insufficient documentation

## 2017-10-17 NOTE — Telephone Encounter (Signed)
Patient has been informed.

## 2017-10-17 NOTE — Telephone Encounter (Signed)
Call pt and advise I reordered if she can have this done [facility]   Thanks [place]

## 2017-10-18 ENCOUNTER — Encounter: Payer: Self-pay | Admitting: *Deleted

## 2017-10-18 LAB — CUP PACEART INCLINIC DEVICE CHECK
Implantable Lead Location: 753859
Implantable Lead Model: 5076
Implantable Lead Model: 6942
Implantable Pulse Generator Implant Date: 20130722
MDC IDC LEAD IMPLANT DT: 19970917
MDC IDC LEAD IMPLANT DT: 20070119
MDC IDC LEAD LOCATION: 753860
MDC IDC SESS DTM: 20190215181413

## 2017-10-29 ENCOUNTER — Ambulatory Visit (INDEPENDENT_AMBULATORY_CARE_PROVIDER_SITE_OTHER): Payer: 59 | Admitting: *Deleted

## 2017-10-29 DIAGNOSIS — I469 Cardiac arrest, cause unspecified: Secondary | ICD-10-CM | POA: Diagnosis not present

## 2017-10-29 NOTE — Progress Notes (Signed)
Remote ICD transmission.   

## 2017-10-31 ENCOUNTER — Encounter: Payer: Self-pay | Admitting: Cardiology

## 2017-11-06 ENCOUNTER — Other Ambulatory Visit: Payer: Self-pay | Admitting: Family

## 2017-11-12 ENCOUNTER — Encounter: Payer: Self-pay | Admitting: Family Medicine

## 2017-11-12 ENCOUNTER — Ambulatory Visit (INDEPENDENT_AMBULATORY_CARE_PROVIDER_SITE_OTHER): Payer: 59 | Admitting: Family Medicine

## 2017-11-12 VITALS — BP 118/62 | HR 64 | Temp 97.5°F | Wt 151.0 lb

## 2017-11-12 DIAGNOSIS — J0100 Acute maxillary sinusitis, unspecified: Secondary | ICD-10-CM | POA: Diagnosis not present

## 2017-11-12 MED ORDER — DOXYCYCLINE HYCLATE 100 MG PO TABS: 100.0000 mg | ORAL_TABLET | Freq: Two times a day (BID) | ORAL | 0 refills | Status: DC

## 2017-11-12 NOTE — Progress Notes (Signed)
Patient ID: Nicole Swanson, female   DOB: 22-Mar-1956, 62 y.o.   MRN: [id number]  PCP: [name], MD  Subjective:  Nicole Swanson is a 62 y.o. year old very pleasant female patient who presents with symptoms including nasal congestion, sinus pressure/tenderness, cough that is nonproductive, and post nasal drip. Associated ear pressure/pain -started: 3 weeks ago  symptoms are not improving -previous treatments: saline rinses daily, benedryl, and mucinex have provided limited benefit. -sick contacts/travel/risks: denies flu exposure. Influenza vaccine is UTD. No recent antibiotic use -Hx of: allergies, she takes either Allegra, Claritin, or Zyrtec. She will alternate these occasionally when she feels that one has "stopped working"  ROS-denies fever, chest pain, palpitations, SOB, NVD, tooth pain  Pertinent Past Medical History- PVCs, she has a defibrillator and pacemaker. She is followed by electric physiology. She is followed every 3 to 6 months.   Medications- reviewed  Current Outpatient Medications  Medication Sig Dispense Refill  . aspirin EC 81 MG tablet Take 81 mg by mouth daily.    . Calcium Carbonate-Vitamin D (CALCIUM-VITAMIN D) 500-200 MG-UNIT tablet Take 1 tablet by mouth daily.    . Cranberry 1000 MG CAPS Take by mouth.    . dicyclomine (BENTYL) 20 MG tablet Take 1 tablet (20 mg total) 3 (three) times daily as needed by mouth for spasms. 30 tablet 1  . docusate sodium (COLACE) 250 MG capsule Take 250 mg by mouth 2 (two) times daily.    . DULoxetine (CYMBALTA) 30 MG capsule TAKE ONE 30 MG CAPSULE BY MOUTH ONCE A DAY FOR THE FIRST WEEK. THEN INCREASE TO TWO 30 MG CAPSULES ( TOTAL 60MG ) BY MOUTH ONCE DAILY. 60 capsule 3  . famotidine (PEPCID) 20 MG tablet Take 1 tablet (20 mg total) by mouth 2 (two) times daily. 60 tablet 0  . fexofenadine (ALLEGRA) 180 MG tablet Take 180 mg by mouth daily as needed for allergies or rhinitis.     . fluticasone (FLONASE) 50 MCG/ACT nasal spray USE  1 SPRAY IN EACH NOSTRIL DAILY 16 g 5  . montelukast (SINGULAIR) 10 MG tablet TAKE 1 TABLET BY MOUTH AT BEDTIME. 90 tablet 1  . omeprazole (PRILOSEC) 40 MG capsule Take 40 mg by mouth 2 (two) times daily.    . ondansetron (ZOFRAN) 4 MG tablet Take 1 tablet (4 mg total) by mouth every 8 (eight) hours as needed for nausea or vomiting. 30 tablet 0  . ondansetron (ZOFRAN) 8 MG tablet TAKE 1 TABLET BY MOUTH EVERY 8 HOURS AS NEEDED FOR NAUSEA OR VOMITING. 20 tablet 0  . oxyCODONE-acetaminophen (PERCOCET) 5-325 MG tablet Take 1 tablet by mouth every 8 (eight) hours as needed for moderate pain or severe pain. 15 tablet 0  . senna (SENOKOT) 8.6 MG tablet Take 1 tablet by mouth as needed for constipation.    . sotalol (BETAPACE) 120 MG tablet TAKE 1 TABLET BY MOUTH 2 TIMES DAILY. 180 tablet 3   No current facility-administered medications for this visit.     Objective: BP 118/62 (BP Location: Right Arm, Patient Position: Sitting, Cuff Size: Normal)   Pulse 64   Temp (!) 97.5 F (36.4 C) (Oral)   Wt 151 lb (68.5 kg)   BMI 23.65 kg/m  Gen: NAD, resting comfortably HEENT: Turbinates erythematous, TMs normal bilaterally , pharynx mildly erythematous with no tonsilar exudate or edema, + maxillary sinus tenderness CV: RRR no murmurs rubs or gallops Lungs: CTAB no crackles, wheeze, rhonchi Abdomen: soft/nontender/nondistended/normal bowel sounds. No rebound or  guarding.  Ext: no edema Skin: warm, dry, no rash Neuro: grossly normal, moves all extremities  Assessment/Plan:  1. Acute maxillary sinusitis, recurrence not specified Exam and history support empiric treatment for bacterial sinus infection. PCN allergy noted. Advised patient on supportive measures:  Get rest, drink plenty of fluids, and use tylenol as needed for pain. Follow up if fever >101, if symptoms worsen or if symptoms are not improved in 3-4 days. Patient  verbalizes understanding and agrees with plan.  - doxycycline (VIBRA-TABS) 100  MG tablet; Take 1 tablet (100 mg total) by mouth 2 (two) times daily.  Dispense: 20 tablet; Refill: 0  She will follow up with PCP for AWV.   Finally, we reviewed reasons to return to care including if symptoms worsen or persist or new concerns arise- once again particularly shortness of breath or fever.    Laurita Quint, FNP

## 2017-11-12 NOTE — Patient Instructions (Signed)
Please take medication as directed. Follow up with Dr. Derrel Nip for your Annual Wellness  Please drink plenty of water so that your urine is pale yellow or clear. Also, get plenty of rest, use tylenol as needed for discomfort and follow up if symptoms do not improve in 3 to 4 days, worsen, or you develop a fever >101.    Sinusitis, Adult Sinusitis is soreness and inflammation of your sinuses. Sinuses are hollow spaces in the bones around your face. They are located:  Around your eyes.  In the middle of your forehead.  Behind your nose.  In your cheekbones.  Your sinuses and nasal passages are lined with a stringy fluid (mucus). Mucus normally drains out of your sinuses. When your nasal tissues get inflamed or swollen, the mucus can get trapped or blocked so air cannot flow through your sinuses. This lets bacteria, viruses, and funguses grow, and that leads to infection. Follow these instructions at home: Medicines  Take, use, or apply over-the-counter and prescription medicines only as told by your doctor. These may include nasal sprays.  If you were prescribed an antibiotic medicine, take it as told by your doctor. Do not stop taking the antibiotic even if you start to feel better. Hydrate and Humidify  Drink enough water to keep your pee (urine) clear or pale yellow.  Use a cool mist humidifier to keep the humidity level in your home above 50%.  Breathe in steam for 10-15 minutes, 3-4 times a day or as told by your doctor. You can do this in the bathroom while a hot shower is running.  Try not to spend time in cool or dry air. Rest  Rest as much as possible.  Sleep with your head raised (elevated).  Make sure to get enough sleep each night. General instructions  Put a warm, moist washcloth on your face 3-4 times a day or as told by your doctor. This will help with discomfort.  Wash your hands often with soap and water. If there is no soap and water, use hand  sanitizer.  Do not smoke. Avoid being around people who are smoking (secondhand smoke).  Keep all follow-up visits as told by your doctor. This is important. Contact a doctor if:  You have a fever.  Your symptoms get worse.  Your symptoms do not get better within 10 days. Get help right away if:  You have a very bad headache.  You cannot stop throwing up (vomiting).  You have pain or swelling around your face or eyes.  You have trouble seeing.  You feel confused.  Your neck is stiff.  You have trouble breathing. This information is not intended to replace advice given to you by your health care provider. Make sure you discuss any questions you have with your health care provider. Document Released: 02/06/2008 Document Revised: 04/15/2016 Document Reviewed: 06/15/2015 Elsevier Interactive Patient Education  [employer].

## 2017-11-13 LAB — CUP PACEART REMOTE DEVICE CHECK
Battery Voltage: 2.94 V
Brady Statistic AS VP Percent: 0.04 %
Brady Statistic RA Percent Paced: 52.86 %
Brady Statistic RV Percent Paced: 0.06 %
HighPow Impedance: 456 Ohm
HighPow Impedance: 53 Ohm
HighPow Impedance: 74 Ohm
Implantable Lead Implant Date: 19970917
Implantable Lead Implant Date: 20070119
Implantable Lead Location: 753859
Implantable Lead Model: 5076
Implantable Lead Model: 6942
Implantable Pulse Generator Implant Date: 20130722
Lead Channel Impedance Value: 456 Ohm
Lead Channel Pacing Threshold Amplitude: 0.5 V
Lead Channel Pacing Threshold Pulse Width: 0.4 ms
Lead Channel Pacing Threshold Pulse Width: 0.4 ms
Lead Channel Sensing Intrinsic Amplitude: 2.125 mV
Lead Channel Sensing Intrinsic Amplitude: 2.125 mV
Lead Channel Sensing Intrinsic Amplitude: 9.75 mV
Lead Channel Setting Pacing Amplitude: 2 V
Lead Channel Setting Pacing Amplitude: 2.5 V
MDC IDC LEAD LOCATION: 753860
MDC IDC MSMT LEADCHNL RV IMPEDANCE VALUE: 456 Ohm
MDC IDC MSMT LEADCHNL RV PACING THRESHOLD AMPLITUDE: 0.875 V
MDC IDC MSMT LEADCHNL RV SENSING INTR AMPL: 9.75 mV
MDC IDC SESS DTM: 20190226093728
MDC IDC SET LEADCHNL RV PACING PULSEWIDTH: 0.4 ms
MDC IDC SET LEADCHNL RV SENSING SENSITIVITY: 0.45 mV
MDC IDC STAT BRADY AP VP PERCENT: 0.01 %
MDC IDC STAT BRADY AP VS PERCENT: 53.17 %
MDC IDC STAT BRADY AS VS PERCENT: 46.78 %

## 2017-11-13 NOTE — Progress Notes (Signed)
Pt was scheduled on 10/16/17.

## 2017-11-15 DIAGNOSIS — X32XXXA Exposure to sunlight, initial encounter: Secondary | ICD-10-CM | POA: Diagnosis not present

## 2017-11-15 DIAGNOSIS — L570 Actinic keratosis: Secondary | ICD-10-CM | POA: Diagnosis not present

## 2017-12-18 ENCOUNTER — Telehealth: Payer: Self-pay | Admitting: Internal Medicine

## 2017-12-18 ENCOUNTER — Other Ambulatory Visit (INDEPENDENT_AMBULATORY_CARE_PROVIDER_SITE_OTHER): Payer: 59

## 2017-12-18 DIAGNOSIS — R350 Frequency of micturition: Secondary | ICD-10-CM | POA: Diagnosis not present

## 2017-12-18 LAB — URINALYSIS, ROUTINE W REFLEX MICROSCOPIC
BILIRUBIN URINE: NEGATIVE
Hgb urine dipstick: NEGATIVE
Ketones, ur: NEGATIVE
Leukocytes, UA: NEGATIVE
Nitrite: NEGATIVE
PH: 6 (ref 5.0–8.0)
RBC / HPF: NONE SEEN (ref 0–?)
Specific Gravity, Urine: >=1.03 — AB (ref 1.000–1.030)
Total Protein, Urine: NEGATIVE
Urine Glucose: NEGATIVE
Urobilinogen, UA: 0.2 (ref 0.0–1.0)

## 2017-12-18 NOTE — Telephone Encounter (Signed)
Spoke with pt and she stated that she would be in this afternoon to leave a urine. Pt gave a verbal understanding about needing to be seen if the urine is negative.

## 2017-12-18 NOTE — Telephone Encounter (Signed)
Spoke with pt and she stated that for about a week she has been having urinary frequency, burning, low back pain, and lower abdominal pain. Pt is wanting to know if she could just come by and leave a urine sample. Pt was offered an appt but stated that she did not want to make an appt.

## 2017-12-18 NOTE — Telephone Encounter (Signed)
Copied from [place] [id number]. Topic: Quick Communication - See Telephone Encounter >> Dec 18, 2017  8:50 AM Cleaster Corin, NT wrote: CRM for notification. See Telephone encounter for: 12/18/17.  Pt. Wants to come by to give a urine sample thinks she has an uti doesn't want to make appt. Pt. Cb# [phone]

## 2017-12-18 NOTE — Telephone Encounter (Signed)
If the urinalysis is negative, she will have to be seen for any future workup. Please let her know that  I have ordered the UA and culture as a one time COURTESY

## 2017-12-20 LAB — URINE CULTURE
MICRO NUMBER:: 90473089
SPECIMEN QUALITY: ADEQUATE

## 2017-12-21 ENCOUNTER — Encounter: Payer: Self-pay | Admitting: Internal Medicine

## 2017-12-23 NOTE — Telephone Encounter (Signed)
See other mychart message from same day.

## 2018-01-04 ENCOUNTER — Ambulatory Visit (INDEPENDENT_AMBULATORY_CARE_PROVIDER_SITE_OTHER): Payer: 59 | Admitting: Family Medicine

## 2018-01-04 ENCOUNTER — Encounter: Payer: Self-pay | Admitting: Family Medicine

## 2018-01-04 VITALS — BP 112/78 | HR 98 | Temp 98.8°F | Resp 16 | Ht 67.0 in | Wt 150.0 lb

## 2018-01-04 DIAGNOSIS — J069 Acute upper respiratory infection, unspecified: Secondary | ICD-10-CM | POA: Diagnosis not present

## 2018-01-04 DIAGNOSIS — J0100 Acute maxillary sinusitis, unspecified: Secondary | ICD-10-CM

## 2018-01-04 MED ORDER — DOXYCYCLINE HYCLATE 100 MG PO TABS
100.0000 mg | ORAL_TABLET | Freq: Two times a day (BID) | ORAL | 0 refills | Status: DC
Start: 2018-01-04 — End: 2018-02-07

## 2018-01-04 MED ORDER — PROMETHAZINE-DM 6.25-15 MG/5ML PO SYRP: 5.0000 mL | ORAL_SOLUTION | Freq: Four times a day (QID) | ORAL | 0 refills | Status: DC | PRN

## 2018-01-04 MED ORDER — BENZONATATE 200 MG PO CAPS: 200.0000 mg | ORAL_CAPSULE | Freq: Two times a day (BID) | ORAL | 0 refills | Status: DC | PRN

## 2018-01-04 NOTE — Progress Notes (Signed)
SUBJECTIVE:  Nicole Swanson is a 62 y.o. female pt, new to me, who presents to weekend acute clinic with complaints of coryza, congestion, productive cough and bilateral sinus pain for 2 weeks.  Cough was initially productive but now dry.  Nasal drainage is thicker and now has sinus pressure. . She denies a history of anorexia, chest pain and fevers and denies a history of asthma. Patient denies smoke cigarettes.  She is allergic to PCN and vancomycin, sulfa abx cause diarrhea.  OTC delsym and mucinex have only been slightly effective.   Current Outpatient Medications on File Prior to Visit  Medication Sig Dispense Refill  . aspirin EC 81 MG tablet Take 81 mg by mouth daily.    . Calcium Carbonate-Vitamin D (CALCIUM-VITAMIN D) 500-200 MG-UNIT tablet Take 1 tablet by mouth daily.    . Cranberry 1000 MG CAPS Take by mouth.    . dicyclomine (BENTYL) 20 MG tablet Take 1 tablet (20 mg total) 3 (three) times daily as needed by mouth for spasms. 30 tablet 1  . docusate sodium (COLACE) 250 MG capsule Take 250 mg by mouth 2 (two) times daily.    Marland Kitchen doxycycline (VIBRA-TABS) 100 MG tablet Take 1 tablet (100 mg total) by mouth 2 (two) times daily. 20 tablet 0  . DULoxetine (CYMBALTA) 30 MG capsule TAKE ONE 30 MG CAPSULE BY MOUTH ONCE A DAY FOR THE FIRST WEEK. THEN INCREASE TO TWO 30 MG CAPSULES ( TOTAL 60MG ) BY MOUTH ONCE DAILY. 60 capsule 3  . famotidine (PEPCID) 20 MG tablet Take 1 tablet (20 mg total) by mouth 2 (two) times daily. 60 tablet 0  . fexofenadine (ALLEGRA) 180 MG tablet Take 180 mg by mouth daily as needed for allergies or rhinitis.     . fluticasone (FLONASE) 50 MCG/ACT nasal spray USE 1 SPRAY IN EACH NOSTRIL DAILY 16 g 5  . montelukast (SINGULAIR) 10 MG tablet TAKE 1 TABLET BY MOUTH AT BEDTIME. 90 tablet 1  . omeprazole (PRILOSEC) 40 MG capsule Take 40 mg by mouth 2 (two) times daily.    . ondansetron (ZOFRAN) 4 MG tablet Take 1 tablet (4 mg total) by mouth every 8 (eight) hours as needed for  nausea or vomiting. 30 tablet 0  . ondansetron (ZOFRAN) 8 MG tablet TAKE 1 TABLET BY MOUTH EVERY 8 HOURS AS NEEDED FOR NAUSEA OR VOMITING. 20 tablet 0  . oxyCODONE-acetaminophen (PERCOCET) 5-325 MG tablet Take 1 tablet by mouth every 8 (eight) hours as needed for moderate pain or severe pain. 15 tablet 0  . senna (SENOKOT) 8.6 MG tablet Take 1 tablet by mouth as needed for constipation.    . sotalol (BETAPACE) 120 MG tablet TAKE 1 TABLET BY MOUTH 2 TIMES DAILY. 180 tablet 3   No current facility-administered medications on file prior to visit.     Allergies  Allergen Reactions  . Vancomycin Rash    Other Reaction: RASH & WELTS  . Penicillins Rash    Has patient had a PCN reaction causing immediate rash, facial/tongue/throat swelling, SOB or lightheadedness with hypotension: No Has patient had a PCN reaction causing severe rash involving mucus membranes or skin necrosis: No Has patient had a PCN reaction that required hospitalization: No Has patient had a PCN reaction occurring within the last 10 years: No If all of the above answers are "NO", then may proceed with Cephalosporin use.   . Sulfa Antibiotics Diarrhea    Past Medical History:  Diagnosis Date  . Aborted cardiac arrest  1994  . AICD (automatic cardioverter/defibrillator) present   . Allergy   . Anginal pain ([place])   . Arthritis   . Asthma    hx of asthma related to air bag release   . Dual implantable cardiac defibrillator    [employer] initially implanted in 1997  . DVT (deep vein thrombosis) in pregnancy ([place]) 1986   had heparin shots followed by coumadin  . Fall    10/11/17 walking dogs   . Family history of seizures    Question neurological versus cardiac  . Heart murmur   . Irritable bowel syndrome   . 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 PONV (postoperative nausea and vomiting)   . Presence of permanent cardiac pacemaker   . PVC's (premature ventricular contractions)   . Raynaud's syndrome    . Shortness of breath dyspnea    with exertion   . Ventricular tachycardia [facility])     Past Surgical History:  Procedure Laterality Date  . ABDOMINAL HYSTERECTOMY    . APPENDECTOMY    . CARDIAC DEFIBRILLATOR PLACEMENT  1994   Dual chamber with pacemaker  . CARPAL TUNNEL RELEASE Right   . COLONOSCOPY WITH PROPOFOL N/A 01/18/2017   Procedure: COLONOSCOPY WITH PROPOFOL;  Surgeon: Manya Silvas, MD;  Location: [facility] ENDOSCOPY;  Service: Endoscopy;  Laterality: N/A;  . CYSTOCELE REPAIR N/A 06/19/2016   Procedure: ANTERIOR REPAIR (CYSTOCELE) WITH VAULT PROLAPSE;  Surgeon: Bjorn Loser, MD;  Location: [facility] ORS;  Service: Urology;  Laterality: N/A;  . CYSTOSCOPY N/A 06/19/2016   Procedure: CYSTOSCOPY;  Surgeon: Bjorn Loser, MD;  Location: [facility] ORS;  Service: Urology;  Laterality: N/A;  . ESOPHAGOGASTRODUODENOSCOPY (EGD) WITH PROPOFOL N/A 01/18/2017   Procedure: ESOPHAGOGASTRODUODENOSCOPY (EGD) WITH PROPOFOL;  Surgeon: Manya Silvas, MD;  Location: [facility] ENDOSCOPY;  Service: Endoscopy;  Laterality: N/A;  . LAPAROSCOPIC LYSIS OF ADHESIONS    . NASAL SEPTUM SURGERY  [address]   . OOPHORECTOMY    . PACEMAKER INSERTION     5 different devices    Family History  Problem Relation Age of Onset  . Heart disease Mother 66       A Fib, CHF  . Stroke Mother   . Heart disease Father   . Stroke Father   . Seizures Brother   . Seizures Daughter     Social History   Socioeconomic History  . Marital status: Widowed    Spouse name: Not on file  . Number of children: Not on file  . Years of education: Not on file  . Highest education level: Not on file  Occupational History  . Not on file  Social Needs  . Financial resource strain: Not on file  . Food insecurity:    Worry: Not on file    Inability: Not on file  . Transportation needs:    Medical: Not on file    Non-medical: Not on file  Tobacco Use  . Smoking status: Former Smoker    Types: Cigarettes    Last  attempt to quit: 06/04/1995    Years since quitting: 22.6  . Smokeless tobacco: Never Used  Substance and Sexual Activity  . Alcohol use: Yes    Alcohol/week: 0.0 oz    Comment: occasional  . Drug use: No  . Sexual activity: Not on file  Lifestyle  . Physical activity:    Days per week: Not on file    Minutes per session: Not on file  . Stress:  Not on file  Relationships  . Social connections:    Talks on phone: Not on file    Gets together: Not on file    Attends religious service: Not on file    Active member of club or organization: Not on file    Attends meetings of clubs or organizations: Not on file    Relationship status: Not on file  . Intimate partner violence:    Fear of current or ex partner: Not on file    Emotionally abused: Not on file    Physically abused: Not on file    Forced sexual activity: Not on file  Other Topics Concern  . Not on file  Social History Narrative   Works [place]    [employer] [place], [place], Social History, Family History, Medications, and allergies have been reviewed in [facility], and have been updated if relevant.  OBJECTIVE: BP 112/78 (BP Location: Left Arm, Patient Position: Sitting, Cuff Size: Small)   Pulse 98   Temp 98.8 F (37.1 C) (Oral)   Resp 16   Ht 5\' 7"  (1.702 m)   Wt 150 lb (68 kg)   SpO2 94%   BMI 23.49 kg/m   She appears well, vital signs are as noted. Ears normal.  Throat and pharynx normal.  Neck supple. No adenopathy in the neck. Nose is congested. Sinuses tender. Lungs are clear but has a harsh, wet cough throughout the exam.  ASSESSMENT:  sinusitis and bronchitis/walking PNA  PLAN: Given duration and progression of symptoms, will treat for bacterial infection with doxycyline.  Symptomatic therapy suggested: push fluids, rest and return office visit prn if symptoms persist or worsen. Tessalon and promethazine dm  As needed for cough- discussed sedation precautions with promethazine. Call or return to clinic  prn if these symptoms worsen or fail to improve as anticipated.

## 2018-01-04 NOTE — Patient Instructions (Signed)
Great to meet you.  Take doxycyline as directed- 1 tablet twice daily for 10 days.  Tessalon and or promethazine dm cough syrup (can make you sleepy) as needed for cough.

## 2018-01-07 ENCOUNTER — Ambulatory Visit: Payer: Self-pay | Admitting: *Deleted

## 2018-01-07 NOTE — Telephone Encounter (Signed)
Pt reports right sided upper chest pain at breast area, does not radiate. Onset occurs only with coughing, intermittent,"not every time."   and when pt lays on right side.  Seen 01/04/18 at [facility] for URI, cough. On tessalon pearls and doxycycline which she has been taking as ordered. Pt states "cough much better and I feel much better overall."  Denies any SOB, fever, pain is not worse with inspiration. Did have one episode of vomiting SAturday am, no further. State she had been coughing for 2 weeks and "may be muscular". Home care advise given per protocol.   Reason for Disposition . Chest pain(s) lasting a few seconds from coughing  Answer Assessment - Initial Assessment Questions 1. LOCATION: "Where does it hurt?"      Right side upper chest 2. RADIATION: "Does the pain go anywhere else?" (e.g., into neck, jaw, arms, back)     Arm and back last night with position 3. ONSET: "When did the chest pain begin?" (Minutes, hours or days)      Sunday evening 4. PATTERN "Does the pain come and go, or has it been constant since it started?"  "Does it get worse with exertion?"      Comes and goes; sometimes with coughing 5. DURATION: "How long does it last" (e.g., seconds, minutes, hours)     Hurts worse lying on left side 6. SEVERITY: "How bad is the pain?"  (e.g., Scale 1-10; mild, moderate, or severe)    - MILD (1-3): doesn't interfere with normal activities     - MODERATE (4-7): interferes with normal activities or awakens from sleep    - SEVERE (8-10): excruciating pain, unable to do any normal activities       6-7/10 when lying on it 7. CARDIAC RISK FACTORS: "Do you have any history of heart problems or risk factors for heart disease?" (e.g., prior heart attack, angina; high blood pressure, diabetes, being overweight, high cholesterol, smoking, or strong family history of heart disease)     8. PULMONARY RISK FACTORS: "Do you have any history of lung disease?"  (e.g., blood clots in lung, asthma,  emphysema, birth control pills)      9. CAUSE: "What do you think is causing the chest pain?"     Maybe cough 10. OTHER SYMPTOMS: "Do you have any other symptoms?" (e.g., dizziness, nausea, vomiting, sweating, fever, difficulty breathing, cough)      Mild  Nausea; emesis Saturday am.  Protocols used: CHEST PAIN-A-AH

## 2018-01-29 ENCOUNTER — Ambulatory Visit (INDEPENDENT_AMBULATORY_CARE_PROVIDER_SITE_OTHER): Payer: 59 | Admitting: *Deleted

## 2018-01-29 DIAGNOSIS — I469 Cardiac arrest, cause unspecified: Secondary | ICD-10-CM | POA: Diagnosis not present

## 2018-01-29 NOTE — Progress Notes (Signed)
Remote ICD transmission.   

## 2018-01-30 LAB — CUP PACEART REMOTE DEVICE CHECK
Battery Voltage: 2.93 V
Brady Statistic AS VP Percent: 0.04 %
Brady Statistic RA Percent Paced: 51.56 %
Brady Statistic RV Percent Paced: 0.06 %
Date Time Interrogation Session: 20190529073527
HIGH POWER IMPEDANCE MEASURED VALUE: 494 Ohm
HIGH POWER IMPEDANCE MEASURED VALUE: 54 Ohm
HighPow Impedance: 70 Ohm
Implantable Lead Implant Date: 19970917
Implantable Lead Location: 753860
Implantable Lead Model: 5076
Lead Channel Impedance Value: 456 Ohm
Lead Channel Pacing Threshold Amplitude: 0.5 V
Lead Channel Pacing Threshold Amplitude: 0.75 V
Lead Channel Sensing Intrinsic Amplitude: 11.875 mV
Lead Channel Sensing Intrinsic Amplitude: 11.875 mV
Lead Channel Setting Pacing Amplitude: 2 V
Lead Channel Setting Pacing Pulse Width: 0.4 ms
Lead Channel Setting Sensing Sensitivity: 0.45 mV
MDC IDC LEAD IMPLANT DT: 20070119
MDC IDC LEAD LOCATION: 753859
MDC IDC MSMT LEADCHNL RA IMPEDANCE VALUE: 456 Ohm
MDC IDC MSMT LEADCHNL RA PACING THRESHOLD PULSEWIDTH: 0.4 ms
MDC IDC MSMT LEADCHNL RA SENSING INTR AMPL: 2.125 mV
MDC IDC MSMT LEADCHNL RA SENSING INTR AMPL: 2.125 mV
MDC IDC MSMT LEADCHNL RV PACING THRESHOLD PULSEWIDTH: 0.4 ms
MDC IDC PG IMPLANT DT: 20130722
MDC IDC SET LEADCHNL RV PACING AMPLITUDE: 2.5 V
MDC IDC STAT BRADY AP VP PERCENT: 0.01 %
MDC IDC STAT BRADY AP VS PERCENT: 51.6 %
MDC IDC STAT BRADY AS VS PERCENT: 48.34 %

## 2018-02-07 ENCOUNTER — Encounter: Payer: Self-pay | Admitting: Internal Medicine

## 2018-02-07 ENCOUNTER — Ambulatory Visit (INDEPENDENT_AMBULATORY_CARE_PROVIDER_SITE_OTHER): Payer: 59 | Admitting: Internal Medicine

## 2018-02-07 VITALS — BP 130/76 | HR 66 | Temp 97.6°F | Resp 14 | Ht 67.0 in | Wt 153.2 lb

## 2018-02-07 DIAGNOSIS — H6992 Unspecified Eustachian tube disorder, left ear: Secondary | ICD-10-CM

## 2018-02-07 DIAGNOSIS — E559 Vitamin D deficiency, unspecified: Secondary | ICD-10-CM

## 2018-02-07 DIAGNOSIS — R5383 Other fatigue: Secondary | ICD-10-CM

## 2018-02-07 DIAGNOSIS — E785 Hyperlipidemia, unspecified: Secondary | ICD-10-CM | POA: Diagnosis not present

## 2018-02-07 DIAGNOSIS — Z1231 Encounter for screening mammogram for malignant neoplasm of breast: Secondary | ICD-10-CM | POA: Diagnosis not present

## 2018-02-07 DIAGNOSIS — Z1239 Encounter for other screening for malignant neoplasm of breast: Secondary | ICD-10-CM

## 2018-02-07 DIAGNOSIS — Z0000 Encounter for general adult medical examination without abnormal findings: Secondary | ICD-10-CM

## 2018-02-07 DIAGNOSIS — Z0001 Encounter for general adult medical examination with abnormal findings: Secondary | ICD-10-CM | POA: Diagnosis not present

## 2018-02-07 DIAGNOSIS — D72819 Decreased white blood cell count, unspecified: Secondary | ICD-10-CM

## 2018-02-07 MED ORDER — ZOSTER VAC RECOMB ADJUVANTED 50 MCG/0.5ML IM SUSR: 0.5000 mL | Freq: Once | INTRAMUSCULAR | 1 refills | Status: AC

## 2018-02-07 MED ORDER — PREDNISONE 10 MG PO TABS: ORAL_TABLET | ORAL | 0 refills | Status: DC

## 2018-02-07 NOTE — Progress Notes (Signed)
Patient ID: Nicole Swanson, female    DOB: 1955-12-26  Age: 62 y.o. MRN: [id number]  The patient is here for annual \\preventive  examination and management of other chronic and acute problems.    Colonoscopy  June 2018  No mammogram since 2014   The risk factors are reflected in the social history.  The roster of all physicians providing medical care to patient - is listed in the Snapshot section of the chart.  Activities of daily living:  The patient is 100% independent in all ADLs: dressing, toileting, feeding as well as independent mobility  Home safety : The patient has smoke detectors in the home. They wear seatbelts.  There are no firearms at home. There is no violence in the home.   There is no risks for hepatitis, STDs or HIV. There is no   history of blood transfusion. They have no travel history to infectious disease endemic areas of the world.  The patient has seen their dentist in the last six month. They have seen their eye doctor in the last year. They admit to slight hearing difficulty with regard to whispered voices and some television programs.  They have deferred audiologic testing in the last year.  They do not  have excessive sun exposure. Discussed the need for sun protection: hats, long sleeves and use of sunscreen if there is significant sun exposure.   Diet: the importance of a healthy diet is discussed. They do have a healthy diet.  The benefits of regular aerobic exercise were discussed. She walks 4 times per week ,  20 minutes.   Depression screen: there are no signs or vegative symptoms of depression- irritability, change in appetite, anhedonia, sadness/tearfullness.  Cognitive assessment: the patient manages all their financial and personal affairs and is actively engaged. They could relate day,date,year and events; recalled 2/3 objects at 3 minutes; performed clock-face test normally.  The following portions of the patient's history were reviewed and updated as  appropriate: allergies, current medications, past family history, past medical history,  past surgical history, past social history  and problem list.  Visual acuity was not assessed per patient preference since she has regular follow up with her ophthalmologist. Hearing and body mass index were assessed and reviewed.   During the course of the visit the patient was educated and counseled about appropriate screening and preventive services including : fall prevention , diabetes screening, nutrition counseling, colorectal cancer screening, and recommended immunizations.    CC: The primary encounter diagnosis was Breast cancer screening. Diagnoses of Leukopenia, unspecified type, Eustachian tube disorder, left, Hyperlipidemia LDL goal <130, Fatigue, unspecified type, Vitamin D deficiency, and Encounter for preventive health examination were also pertinent to this visit.  1) left ear pain .  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   She has a past surgical history that includes Cardiac defibrillator placement (  1994); Nasal septum surgery (2006); Abdominal hysterectomy; Carpal tunnel release (Right); Laparoscopic lysis of  adhesions; Appendectomy; Pacemaker insertion; Oophorectomy; Cystoscopy (N/A, 06/19/2016); Cystocele repair (N/A, 06/19/2016); Colonoscopy with propofol (N/A, 01/18/2017); and Esophagogastroduodenoscopy (egd) with propofol (N/A, 01/18/2017).   Her family history includes Heart disease in her father; Heart disease (age of onset: 103) in her mother; Seizures in her brother and daughter; Stroke in her father and mother.She reports that she quit smoking about 22 years ago. Her smoking use included cigarettes. She has never used smokeless tobacco. She reports that she drinks alcohol. She reports that she does not use drugs.  Outpatient Medications Prior to Visit  Medication Sig Dispense Refill  . aspirin EC 81 MG tablet Take 81 mg by mouth daily.    . Calcium Carbonate-Vitamin D (CALCIUM-VITAMIN D) 500-200 MG-UNIT tablet Take 1 tablet by mouth daily.    . Cranberry 1000 MG CAPS Take by mouth.    . dicyclomine (BENTYL) 20 MG tablet Take 1 tablet (20 mg total) 3 (three) times daily as needed by mouth for spasms. 30 tablet 1  . docusate sodium (COLACE) 250 MG capsule Take 250 mg by mouth 2 (two) times daily.    . DULoxetine (CYMBALTA) 30 MG capsule TAKE ONE 30 MG CAPSULE BY MOUTH ONCE A DAY FOR THE FIRST WEEK. THEN INCREASE TO TWO 30 MG CAPSULES ( TOTAL 60MG ) BY MOUTH ONCE DAILY. 60 capsule 3  . famotidine (PEPCID) 20 MG tablet Take 1 tablet (20 mg total) by mouth 2 (two) times daily. 60 tablet 0  . fexofenadine (ALLEGRA) 180 MG tablet Take 180 mg by mouth daily as needed for allergies or rhinitis.     . fluticasone (FLONASE) 50 MCG/ACT nasal spray USE 1 SPRAY IN EACH NOSTRIL DAILY 16 g 5  . montelukast (SINGULAIR) 10 MG tablet TAKE 1 TABLET BY MOUTH AT BEDTIME. 90 tablet 1  . omeprazole (PRILOSEC) 40 MG capsule Take 40 mg by mouth 2 (two) times daily.    . ondansetron (ZOFRAN) 4 MG tablet Take 1 tablet (4 mg total) by mouth every 8 (eight) hours as needed for nausea or vomiting. 30 tablet 0  . senna (SENOKOT)  8.6 MG tablet Take 1 tablet by mouth as needed for constipation.    . sotalol (BETAPACE) 120 MG tablet TAKE 1 TABLET BY MOUTH 2 TIMES DAILY. 180 tablet 3  . benzonatate (TESSALON) 200 MG capsule Take 1 capsule (200 mg total) by mouth 2 (two) times daily as needed for cough. (Patient not taking: Reported on 02/07/2018) 20 capsule 0  . doxycycline (VIBRA-TABS) 100 MG tablet Take 1 tablet (100 mg total) by mouth 2 (two) times daily. (Patient not taking: Reported on 02/07/2018) 20 tablet 0  . ondansetron (ZOFRAN) 8 MG tablet TAKE 1 TABLET BY MOUTH EVERY 8 HOURS AS NEEDED FOR NAUSEA OR VOMITING. (Patient not taking: Reported on 02/07/2018) 20 tablet 0  . oxyCODONE-acetaminophen (PERCOCET) 5-325 MG tablet Take 1 tablet by mouth every 8 (eight) hours as needed for moderate pain or severe pain. (Patient not taking: Reported on 02/07/2018) 15 tablet 0  . promethazine-dextromethorphan (PROMETHAZINE-DM) 6.25-15 MG/5ML syrup Take 5 mLs by mouth 4 (four) times daily as needed. (Patient not taking: Reported on 02/07/2018) 118 mL 0   No facility-administered medications prior to visit.     Review of Systems   Patient denies headache, fevers, malaise, unintentional weight loss, skin rash, eye pain, sinus congestion and sinus pain, sore throat, dysphagia,  hemoptysis , cough, dyspnea, wheezing, chest pain,  palpitations, orthopnea, edema, abdominal pain, nausea, melena, diarrhea, constipation, flank pain, dysuria, hematuria, urinary  Frequency, nocturia, numbness, tingling, seizures,  Focal weakness, Loss of consciousness,  Tremor, insomnia, depression, anxiety, and suicidal ideation.     Objective:  BP 130/76 (BP Location: Left Arm, Patient Position: Sitting, Cuff Size: Normal)   Pulse 66   Temp 97.6 F (36.4 C) (Oral)   Resp 14   Ht 5\' 7"  (1.702 m)   Wt 153 lb 3.2 oz (69.5 kg)   SpO2 98%   BMI 23.99 kg/m   Physical Exam   General appearance: alert, cooperative and appears stated age Head: Normocephalic, without  obvious abnormality, atraumatic Eyes: conjunctivae/corneas clear. PERRL, EOM's intact. Fundi benign. Ears: normal TM's and external ear canals both ears Nose: Nares normal. Septum midline. Mucosa normal. No drainage or sinus tenderness. Throat: lips, mucosa, and tongue normal; teeth and gums normal Neck: no adenopathy, no carotid bruit, no JVD, supple, symmetrical, trachea midline and thyroid not enlarged, symmetric, no tenderness/mass/nodules Lungs: clear to auscultation bilaterally Breasts: normal appearance, no masses or tenderness Heart: regular rate and rhythm, S1, S2 normal, no murmur, click, rub or gallop Abdomen: soft, non-tender; bowel sounds normal; no masses,  no organomegaly Extremities: extremities normal, atraumatic, no cyanosis or edema Pulses: 2+ and symmetric Skin: Skin color, texture, turgor normal. No rashes or lesions Neurologic: Alert and oriented X 3, normal strength and tone. Normal symmetric reflexes. Normal coordination and gait.     Assessment & Plan:   Problem List Items Addressed This Visit    Eustachian tube disorder, left   Encounter for preventive health examination    Annual comprehensive preventive exam was done as well as an evaluation and management of chronic conditions .  During the course of the visit the patient was educated and counseled about appropriate screening and preventive services including :  diabetes screening, lipid analysis with projected  10 year  risk for CAD , nutrition counseling, breast, cervical and colorectal cancer screening, and recommended immunizations.  Printed recommendations for health maintenance screenings was given       Other Visit Diagnoses    Breast cancer screening    -  Primary   Relevant Orders   MM 3D SCREEN BREAST BILATERAL   Leukopenia, unspecified type       Relevant Orders   CBC with Differential/Platelet   Hyperlipidemia LDL goal <130       Relevant Orders   Lipid panel   Fatigue, unspecified type        Relevant Orders   Comprehensive metabolic panel   TSH   Vitamin D deficiency       Relevant Orders   VITAMIN D 25 Hydroxy (Vit-D Deficiency, Fractures)   CBC with Differential/Platelet      I have discontinued Rahima A. Ohmer's oxyCODONE-acetaminophen, benzonatate, promethazine-dextromethorphan, and doxycycline. I am also having her start on predniSONE and Zoster Vaccine Adjuvanted. Additionally, I am having her maintain her docusate sodium, fexofenadine, calcium-vitamin D, Cranberry, famotidine, aspirin EC, senna, omeprazole, sotalol, fluticasone, montelukast, dicyclomine, ondansetron, and DULoxetine.  Meds ordered this encounter  Medications  . predniSONE (DELTASONE) 10 MG tablet    Sig: 6 tablets on Day 1 , then reduce by 1 tablet daily until gone    Dispense:  21 tablet    Refill:  0  . Zoster Vaccine Adjuvanted [facility]) injection    Sig: Inject 0.5 mLs into the muscle once for 1 dose.    Dispense:  1 each    Refill:  1    Medications Discontinued During This Encounter  Medication Reason  . benzonatate (TESSALON) 200 MG capsule Completed Course  . doxycycline (VIBRA-TABS) 100 MG tablet Completed Course  . ondansetron (ZOFRAN) 8 MG tablet Duplicate  . oxyCODONE-acetaminophen (PERCOCET) 5-325 MG tablet Patient has not taken in last 30 days  . promethazine-dextromethorphan (PROMETHAZINE-DM) 6.25-15 MG/5ML syrup Patient has not taken in last 30 days    Follow-up: Return in about 6 months (around 08/09/2018).   [name], MD

## 2018-02-07 NOTE — Patient Instructions (Signed)
I have ordered your mammogram and fasting labs  Please make an appt (you need a 4 hour fast)   'we will review records form [facility] re PAP smears and DEXA scan   Talk to your cardiologist about whether you need to continue daily aspirin    Health Maintenance for Postmenopausal Women Menopause is a normal process in which your reproductive ability comes to an end. This process happens gradually over a span of months to years, usually between the ages of 58 and 60. Menopause is complete when you have missed 12 consecutive menstrual periods. It is important to talk with your health care provider about some of the most common conditions that affect postmenopausal women, such as heart disease, cancer, and bone loss (osteoporosis). Adopting a healthy lifestyle and getting preventive care can help to promote your health and wellness. Those actions can also lower your chances of developing some of these common conditions. What should I know about menopause? During menopause, you may experience a number of symptoms, such as:  Moderate-to-severe hot flashes.  Night sweats.  Decrease in sex drive.  Mood swings.  Headaches.  Tiredness.  Irritability.  Memory problems.  Insomnia.  Choosing to treat or not to treat menopausal changes is an individual decision that you make with your health care provider. What should I know about hormone replacement therapy and supplements? Hormone therapy products are effective for treating symptoms that are associated with menopause, such as hot flashes and night sweats. Hormone replacement carries certain risks, especially as you become older. If you are thinking about using estrogen or estrogen with progestin treatments, discuss the benefits and risks with your health care provider. What should I know about heart disease and stroke? Heart disease, heart attack, and stroke become more likely as you age. This may be due, in part, to the hormonal changes  that your body experiences during menopause. These can affect how your body processes dietary fats, triglycerides, and cholesterol. Heart attack and stroke are both medical emergencies. There are many things that you can do to help prevent heart disease and stroke:  Have your blood pressure checked at least every 1-2 years. High blood pressure causes heart disease and increases the risk of stroke.  If you are 61-56 years old, ask your health care provider if you should take aspirin to prevent a heart attack or a stroke.  Do not use any tobacco products, including cigarettes, chewing tobacco, or electronic cigarettes. If you need help quitting, ask your health care provider.  It is important to eat a healthy diet and maintain a healthy weight. ? Be sure to include plenty of vegetables, fruits, low-fat dairy products, and lean protein. ? Avoid eating foods that are high in solid fats, added sugars, or salt (sodium).  Get regular exercise. This is one of the most important things that you can do for your health. ? Try to exercise for at least 150 minutes each week. The type of exercise that you do should increase your heart rate and make you sweat. This is known as moderate-intensity exercise. ? Try to do strengthening exercises at least twice each week. Do these in addition to the moderate-intensity exercise.  Know your numbers.Ask your health care provider to check your cholesterol and your blood glucose. Continue to have your blood tested as directed by your health care provider.  What should I know about cancer screening? There are several types of cancer. Take the following steps to reduce your risk and to  catch any cancer development as early as possible. Breast Cancer  Practice breast self-awareness. ? This means understanding how your breasts normally appear and feel. ? It also means doing regular breast self-exams. Let your health care provider know about any changes, no matter how  small.  If you are 35 or older, have a clinician do a breast exam (clinical breast exam or CBE) every year. Depending on your age, family history, and medical history, it may be recommended that you also have a yearly breast X-ray (mammogram).  If you have a family history of breast cancer, talk with your health care provider about genetic screening.  If you are at high risk for breast cancer, talk with your health care provider about having an MRI and a mammogram every year.  Breast cancer (BRCA) gene test is recommended for women who have family members with BRCA-related cancers. Results of the assessment will determine the need for genetic counseling and BRCA1 and for BRCA2 testing. BRCA-related cancers include these types: ? Breast. This occurs in males or females. ? Ovarian. ? Tubal. This may also be called fallopian tube cancer. ? Cancer of the abdominal or pelvic lining (peritoneal cancer). ? Prostate. ? Pancreatic.  Cervical, Uterine, and Ovarian Cancer Your health care provider may recommend that you be screened regularly for cancer of the pelvic organs. These include your ovaries, uterus, and vagina. This screening involves a pelvic exam, which includes checking for microscopic changes to the surface of your cervix (Pap test).  For women ages 21-65, health care providers may recommend a pelvic exam and a Pap test every three years. For women ages 35-65, they may recommend the Pap test and pelvic exam, combined with testing for human papilloma virus (HPV), every five years. Some types of HPV increase your risk of cervical cancer. Testing for HPV may also be done on women of any age who have unclear Pap test results.  Other health care providers may not recommend any screening for nonpregnant women who are considered low risk for pelvic cancer and have no symptoms. Ask your health care provider if a screening pelvic exam is right for you.  If you have had past treatment for cervical  cancer or a condition that could lead to cancer, you need Pap tests and screening for cancer for at least 20 years after your treatment. If Pap tests have been discontinued for you, your risk factors (such as having a new sexual partner) need to be reassessed to determine if you should start having screenings again. Some women have medical problems that increase the chance of getting cervical cancer. In these cases, your health care provider may recommend that you have screening and Pap tests more often.  If you have a family history of uterine cancer or ovarian cancer, talk with your health care provider about genetic screening.  If you have vaginal bleeding after reaching menopause, tell your health care provider.  There are currently no reliable tests available to screen for ovarian cancer.  Lung Cancer Lung cancer screening is recommended for adults 22-28 years old who are at high risk for lung cancer because of a history of smoking. A yearly low-dose CT scan of the lungs is recommended if you:  Currently smoke.  Have a history of at least 30 pack-years of smoking and you currently smoke or have quit within the past 15 years. A pack-year is smoking an average of one pack of cigarettes per day for one year.  Yearly screening should:  Continue until it has been 15 years since you quit.  Stop if you develop a health problem that would prevent you from having lung cancer treatment.  Colorectal Cancer  This type of cancer can be detected and can often be prevented.  Routine colorectal cancer screening usually begins at age 49 and continues through age 70.  If you have risk factors for colon cancer, your health care provider may recommend that you be screened at an earlier age.  If you have a family history of colorectal cancer, talk with your health care provider about genetic screening.  Your health care provider may also recommend using home test kits to check for hidden blood in your  stool.  A small camera at the end of a tube can be used to examine your colon directly (sigmoidoscopy or colonoscopy). This is done to check for the earliest forms of colorectal cancer.  Direct examination of the colon should be repeated every 5-10 years until age 38. However, if early forms of precancerous polyps or small growths are found or if you have a family history or genetic risk for colorectal cancer, you may need to be screened more often.  Skin Cancer  Check your skin from head to toe regularly.  Monitor any moles. Be sure to tell your health care provider: ? About any new moles or changes in moles, especially if there is a change in a mole's shape or color. ? If you have a mole that is larger than the size of a pencil eraser.  If any of your family members has a history of skin cancer, especially at a young age, talk with your health care provider about genetic screening.  Always use sunscreen. Apply sunscreen liberally and repeatedly throughout the day.  Whenever you are outside, protect yourself by wearing long sleeves, pants, a wide-brimmed hat, and sunglasses.  What should I know about osteoporosis? Osteoporosis is a condition in which bone destruction happens more quickly than new bone creation. After menopause, you may be at an increased risk for osteoporosis. To help prevent osteoporosis or the bone fractures that can happen because of osteoporosis, the following is recommended:  If you are 21-57 years old, get at least 1,000 mg of calcium and at least 600 mg of vitamin D per day.  If you are older than age 48 but younger than age 71, get at least 1,200 mg of calcium and at least 600 mg of vitamin D per day.  If you are older than age 50, get at least 1,200 mg of calcium and at least 800 mg of vitamin D per day.  Smoking and excessive alcohol intake increase the risk of osteoporosis. Eat foods that are rich in calcium and vitamin D, and do weight-bearing exercises  several times each week as directed by your health care provider. What should I know about how menopause affects my mental health? Depression may occur at any age, but it is more common as you become older. Common symptoms of depression include:  Low or sad mood.  Changes in sleep patterns.  Changes in appetite or eating patterns.  Feeling an overall lack of motivation or enjoyment of activities that you previously enjoyed.  Frequent crying spells.  Talk with your health care provider if you think that you are experiencing depression. What should I know about immunizations? It is important that you get and maintain your immunizations. These include:  Tetanus, diphtheria, and pertussis (Tdap) booster vaccine.  Influenza every year before the  flu season begins.  Pneumonia vaccine.  Shingles vaccine.  Your health care provider may also recommend other immunizations. This information is not intended to replace advice given to you by your health care provider. Make sure you discuss any questions you have with your health care provider. Document Released: 10/12/2005 Document Revised: 03/09/2016 Document Reviewed: 05/24/2015 Elsevier Interactive Patient Education  2018 [employer].

## 2018-02-09 DIAGNOSIS — Z0000 Encounter for general adult medical examination without abnormal findings: Secondary | ICD-10-CM | POA: Insufficient documentation

## 2018-02-09 NOTE — Assessment & Plan Note (Signed)
Annual comprehensive preventive exam was done as well as an evaluation and management of chronic conditions .  During the course of the visit the patient was educated and counseled about appropriate screening and preventive services including :  diabetes screening, lipid analysis with projected  10 year  risk for CAD , nutrition counseling, breast, cervical and colorectal cancer screening, and recommended immunizations.  Printed recommendations for health maintenance screenings was given 

## 2018-02-19 ENCOUNTER — Encounter: Payer: Self-pay | Admitting: Internal Medicine

## 2018-02-21 ENCOUNTER — Other Ambulatory Visit (INDEPENDENT_AMBULATORY_CARE_PROVIDER_SITE_OTHER): Payer: 59

## 2018-02-21 DIAGNOSIS — D72819 Decreased white blood cell count, unspecified: Secondary | ICD-10-CM | POA: Diagnosis not present

## 2018-02-21 DIAGNOSIS — R5383 Other fatigue: Secondary | ICD-10-CM | POA: Diagnosis not present

## 2018-02-21 DIAGNOSIS — E559 Vitamin D deficiency, unspecified: Secondary | ICD-10-CM | POA: Diagnosis not present

## 2018-02-21 DIAGNOSIS — E785 Hyperlipidemia, unspecified: Secondary | ICD-10-CM | POA: Diagnosis not present

## 2018-02-21 LAB — COMPREHENSIVE METABOLIC PANEL
ALBUMIN: 4.2 g/dL (ref 3.5–5.2)
ALK PHOS: 96 U/L (ref 39–117)
ALT: 13 U/L (ref 0–35)
AST: 16 U/L (ref 0–37)
BUN: 17 mg/dL (ref 6–23)
CALCIUM: 9.6 mg/dL (ref 8.4–10.5)
CO2: 28 meq/L (ref 19–32)
Chloride: 103 mEq/L (ref 96–112)
Creatinine, Ser: 0.87 mg/dL (ref 0.40–1.20)
GFR: 69.63 mL/min (ref >60.00)
Glucose, Bld: 114 mg/dL — ABNORMAL HIGH (ref 70–99)
Potassium: 4.1 mEq/L (ref 3.5–5.1)
Sodium: 140 mEq/L (ref 135–145)
Total Bilirubin: 0.4 mg/dL (ref 0.2–1.2)
Total Protein: 7.4 g/dL (ref 6.0–8.3)

## 2018-02-21 LAB — CBC WITH DIFFERENTIAL/PLATELET
BASOS PCT: 1 % (ref 0.0–3.0)
Basophils Absolute: 0 10*3/uL (ref 0.0–0.1)
EOS PCT: 6 % — AB (ref 0.0–5.0)
Eosinophils Absolute: 0.3 10*3/uL (ref 0.0–0.7)
HCT: 40.2 % (ref 36.0–46.0)
Hemoglobin: 13.3 g/dL (ref 12.0–15.0)
LYMPHS ABS: 1 10*3/uL (ref 0.7–4.0)
Lymphocytes Relative: 22.2 % (ref 12.0–46.0)
MCHC: 33 g/dL (ref 30.0–36.0)
MCV: 91 fl (ref 78.0–100.0)
MONOS PCT: 9 % (ref 3.0–12.0)
Monocytes Absolute: 0.4 10*3/uL (ref 0.1–1.0)
NEUTROS PCT: 61.8 % (ref 43.0–77.0)
Neutro Abs: 2.8 10*3/uL (ref 1.4–7.7)
Platelets: 179 10*3/uL (ref 150.0–400.0)
RBC: 4.42 Mil/uL (ref 3.87–5.11)
RDW: 14.3 % (ref 11.5–15.5)
WBC: 4.5 10*3/uL (ref 4.0–10.5)

## 2018-02-21 LAB — LIPID PANEL
CHOL/HDL RATIO: 3
CHOLESTEROL: 226 mg/dL — AB (ref 0–200)
HDL: 85.7 mg/dL (ref >39.00)
LDL Cholesterol: 125 mg/dL — ABNORMAL HIGH (ref 0–99)
NonHDL: 140.15
TRIGLYCERIDES: 78 mg/dL (ref 0.0–149.0)
VLDL: 15.6 mg/dL (ref 0.0–40.0)

## 2018-02-21 LAB — VITAMIN D 25 HYDROXY (VIT D DEFICIENCY, FRACTURES): VITD: 20.23 ng/mL — AB (ref 30.00–100.00)

## 2018-02-21 LAB — TSH: TSH: 1.38 u[IU]/mL (ref 0.35–4.50)

## 2018-02-23 ENCOUNTER — Other Ambulatory Visit: Payer: Self-pay | Admitting: Internal Medicine

## 2018-02-23 MED ORDER — ERGOCALCIFEROL 1.25 MG (50000 UT) PO CAPS: 50000.0000 [IU] | ORAL_CAPSULE | ORAL | 0 refills | Status: DC

## 2018-02-24 ENCOUNTER — Other Ambulatory Visit: Payer: Self-pay | Admitting: Internal Medicine

## 2018-03-19 ENCOUNTER — Telehealth: Payer: Self-pay | Admitting: Internal Medicine

## 2018-03-19 NOTE — Telephone Encounter (Signed)
//  ROI received from [facility] on 03/19/2018

## 2018-03-21 ENCOUNTER — Other Ambulatory Visit: Payer: Self-pay | Admitting: Internal Medicine

## 2018-03-24 NOTE — Telephone Encounter (Signed)
Cymbalta should be 60 mg daily now right?

## 2018-03-24 NOTE — Telephone Encounter (Signed)
Refilled at 60 mg daily dose

## 2018-03-25 NOTE — Telephone Encounter (Signed)
Printed, signed and faxed.  

## 2018-04-01 ENCOUNTER — Other Ambulatory Visit: Payer: Self-pay

## 2018-04-01 MED ORDER — OMEPRAZOLE 40 MG PO CPDR: 40.0000 mg | DELAYED_RELEASE_CAPSULE | Freq: Two times a day (BID) | ORAL | 0 refills | Status: DC

## 2018-04-01 NOTE — Telephone Encounter (Signed)
Medication has not been filled by Dr. Derrel Nip before.   Last OV: 02/07/2018 Next OV: 08/11/2018

## 2018-04-01 NOTE — Telephone Encounter (Signed)
refilled 

## 2018-04-09 ENCOUNTER — Ambulatory Visit (INDEPENDENT_AMBULATORY_CARE_PROVIDER_SITE_OTHER): Payer: 59 | Admitting: Family Medicine

## 2018-04-09 ENCOUNTER — Other Ambulatory Visit: Payer: Self-pay

## 2018-04-09 ENCOUNTER — Encounter: Payer: Self-pay | Admitting: Family Medicine

## 2018-04-09 VITALS — BP 104/62 | HR 70 | Temp 97.7°F | Wt 154.4 lb

## 2018-04-09 DIAGNOSIS — R300 Dysuria: Secondary | ICD-10-CM

## 2018-04-09 DIAGNOSIS — J0100 Acute maxillary sinusitis, unspecified: Secondary | ICD-10-CM | POA: Diagnosis not present

## 2018-04-09 DIAGNOSIS — R0981 Nasal congestion: Secondary | ICD-10-CM

## 2018-04-09 LAB — POCT URINALYSIS DIPSTICK
Bilirubin, UA: POSITIVE
Glucose, UA: NEGATIVE
KETONES UA: NEGATIVE
Leukocytes, UA: NEGATIVE
Nitrite, UA: NEGATIVE
PH UA: 5.5 (ref 5.0–8.0)
Protein, UA: POSITIVE — AB
RBC UA: NEGATIVE
Spec Grav, UA: >=1.03 — AB (ref 1.010–1.025)
UROBILINOGEN UA: 1 U/dL

## 2018-04-09 MED ORDER — DOXYCYCLINE HYCLATE 100 MG PO TABS: 100.0000 mg | ORAL_TABLET | Freq: Two times a day (BID) | ORAL | 0 refills | Status: DC

## 2018-04-09 NOTE — Patient Instructions (Signed)
Great to meet you!  Increase fluids, get plenty of rest, do good handwashing  Saline nasal washes are a great way to open up sinuses/clear nasal congestion  Avoid excess sugar, caffeine, alcohol type beverages - can make painful urination worse. You can continue to use AZO as needed.

## 2018-04-09 NOTE — Progress Notes (Signed)
Subjective:    Patient ID: Nicole Swanson, female    DOB: Jan 12, 1956, 62 y.o.   MRN: [id number]  HPI Presents to clinic c/o sinus pressure, congestion and also some pain with urination.   Sinus pressure, thick drainage has been for 2+ weeks. She uses Allegra, flonase and also has been trying a saline nasal spray.   Burning with urination has been for 2 days and notes increased frequency.  Patient Active Problem List   Diagnosis Date Noted  . Encounter for preventive health examination 02/09/2018  . Eustachian tube disorder, left 07/08/2017  . Cystocele, midline 06/19/2016  . Cardiac arrest ([place]) 05/14/2016  . Ventricular premature depolarization 05/14/2016  . Non-dose-related adverse reaction to medication 03/13/2016  . Urinary frequency 09/16/2015  . Bladder prolapse, female, acquired 09/08/2015  . Other intervertebral disc degeneration, lumbar region 01/25/2015  . Sacroiliac joint dysfunction of both sides 01/25/2015  . Spinal stenosis, lumbar region, with neurogenic claudication 01/25/2015  . Facet syndrome, lumbar 01/25/2015  . Lumbar radiculopathy 01/25/2015  . Calculus of gallbladder without cholecystitis without obstruction 12/23/2014  . Polyarthritis 03/16/2014  . Thrombocytopenia ([place]) 09/11/2013  . Other malaise 09/10/2013  . Major depressive disorder, single episode 09/10/2013  . Raynaud phenomenon 04/17/2013  . Raynaud's syndrome without gangrene 04/17/2013  . Low back pain 12/05/2012  . Shortness of breath 06/27/2012  . Aborted cardiac arrest   . Presence of automatic implantable cardioverter-defibrillator   . PVC's / ventricular tachycardia   . 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 Irritable bowel syndrome with constipation     Social History   Tobacco Use  . Smoking status: Former Smoker    Types: Cigarettes    Last attempt to quit: 06/04/1995    Years since quitting: 22.8  . Smokeless tobacco: Never Used  Substance Use Topics  .  Alcohol use: Yes    Alcohol/week: 0.0 oz    Comment: occasional   Review of Systems  Constitutional: Positive for fatigue. Negative for chills and fever.  HENT: Positive for congestion, ear pain, postnasal drip, rhinorrhea, sinus pressure and sinus pain. Negative for trouble swallowing and voice change.        Sinus pressure creating pain/pressure in top teeth on on bridge of nose where glasses rest.   Eyes: Negative for pain, discharge, redness and itching.  Respiratory: Negative for cough, shortness of breath and wheezing.   Cardiovascular: Negative for chest pain, palpitations and leg swelling.  Gastrointestinal: Negative.   Endocrine: Negative.   Genitourinary: Positive for dysuria and frequency.  Musculoskeletal: Negative.   Skin: Negative for color change, pallor and rash.  Neurological: Positive for headaches. Negative for dizziness and light-headedness.       +headahces from sinus pressure       Objective:   Physical Exam  Constitutional: She is oriented to person, place, and time. She appears well-developed and well-nourished. No distress.  HENT:  Head: Normocephalic and atraumatic.  Nose: Rhinorrhea and sinus tenderness present. Right sinus exhibits maxillary sinus tenderness. Left sinus exhibits maxillary sinus tenderness.  +Post nasal drip. +thick nasal discharge present .  Eyes: Pupils are equal, round, and reactive to light. Conjunctivae and EOM are normal. Right eye exhibits no discharge. Left eye exhibits no discharge. No scleral icterus.  Neck: Normal range of motion. Neck supple. No tracheal deviation present.  Cardiovascular: Normal rate, regular rhythm and normal heart sounds.  Pulmonary/Chest: Effort normal and breath sounds normal. No respiratory distress. She has no wheezes. She  has no rales.  Abdominal: Soft. Bowel sounds are normal. She exhibits no distension. There is no tenderness.  Neurological: She is alert and oriented to person, place, and time.  Skin:  Skin is warm and dry. No pallor.  Psychiatric: She has a normal mood and affect. Her behavior is normal.  Nursing note and vitals reviewed.    Blood pressure 104/62, pulse 70, temperature 97.7 F (36.5 C), temperature source Oral, weight 154 lb 6.4 oz (70 kg), SpO2 95 %.  Assessment & Plan:   Sinusitis - PCN allergic. Doxy BID for 10 days. Continue allergy meds at home as you normally do.  Dysuria - Increase water intake - some protein seen on UA but otherwise UA unremarkable. Avoid excess sugary drinks, alcohol, caffeine. Wear cotton underwear. Wipe front to back after urinating.    Nasal congestion - Increase fluids. Use a saline nasal spray and wash out sinuses using a Neti Pot  Follow up as needed or if symptoms worsen/fail to improve. Keep appt in December as scheduled.

## 2018-04-25 ENCOUNTER — Ambulatory Visit: Payer: Self-pay | Admitting: Family Medicine

## 2018-04-30 ENCOUNTER — Ambulatory Visit (INDEPENDENT_AMBULATORY_CARE_PROVIDER_SITE_OTHER): Payer: 59 | Admitting: *Deleted

## 2018-04-30 DIAGNOSIS — I469 Cardiac arrest, cause unspecified: Secondary | ICD-10-CM | POA: Diagnosis not present

## 2018-04-30 NOTE — Progress Notes (Signed)
Remote ICD transmission.   

## 2018-05-16 ENCOUNTER — Other Ambulatory Visit: Payer: Self-pay | Admitting: Internal Medicine

## 2018-05-20 LAB — CUP PACEART REMOTE DEVICE CHECK
Battery Voltage: 2.86 V
Brady Statistic AP VS Percent: 56.42 %
Brady Statistic AS VS Percent: 43.53 %
Brady Statistic RA Percent Paced: 56.43 %
Brady Statistic RV Percent Paced: 0.05 %
HIGH POWER IMPEDANCE MEASURED VALUE: 56 Ohm
HighPow Impedance: 513 Ohm
HighPow Impedance: 81 Ohm
Implantable Lead Location: 753860
Implantable Lead Model: 5076
Implantable Lead Model: 6942
Implantable Pulse Generator Implant Date: 20130722
Lead Channel Impedance Value: 456 Ohm
Lead Channel Pacing Threshold Amplitude: 0.5 V
Lead Channel Pacing Threshold Amplitude: 0.75 V
Lead Channel Sensing Intrinsic Amplitude: 11.25 mV
Lead Channel Sensing Intrinsic Amplitude: 11.25 mV
Lead Channel Sensing Intrinsic Amplitude: 2 mV
Lead Channel Setting Pacing Amplitude: 2 V
Lead Channel Setting Pacing Pulse Width: 0.4 ms
Lead Channel Setting Sensing Sensitivity: 0.45 mV
MDC IDC LEAD IMPLANT DT: 19970917
MDC IDC LEAD IMPLANT DT: 20070119
MDC IDC LEAD LOCATION: 753859
MDC IDC MSMT LEADCHNL RA IMPEDANCE VALUE: 456 Ohm
MDC IDC MSMT LEADCHNL RA PACING THRESHOLD PULSEWIDTH: 0.4 ms
MDC IDC MSMT LEADCHNL RA SENSING INTR AMPL: 2 mV
MDC IDC MSMT LEADCHNL RV PACING THRESHOLD PULSEWIDTH: 0.4 ms
MDC IDC SESS DTM: 20190828042207
MDC IDC SET LEADCHNL RV PACING AMPLITUDE: 2.5 V
MDC IDC STAT BRADY AP VP PERCENT: 0.01 %
MDC IDC STAT BRADY AS VP PERCENT: 0.04 %

## 2018-05-21 ENCOUNTER — Ambulatory Visit
Admission: RE | Admit: 2018-05-21 | Discharge: 2018-05-21 | Disposition: A | Discharge status: Home / Self Care | Payer: 59 | Source: Ambulatory Visit | Attending: Internal Medicine | Admitting: Internal Medicine

## 2018-05-21 DIAGNOSIS — Z1239 Encounter for other screening for malignant neoplasm of breast: Secondary | ICD-10-CM

## 2018-05-21 DIAGNOSIS — Z1231 Encounter for screening mammogram for malignant neoplasm of breast: Secondary | ICD-10-CM | POA: Insufficient documentation

## 2018-06-02 ENCOUNTER — Encounter: Payer: Self-pay | Admitting: Internal Medicine

## 2018-06-02 ENCOUNTER — Ambulatory Visit (INDEPENDENT_AMBULATORY_CARE_PROVIDER_SITE_OTHER): Payer: 59 | Admitting: Internal Medicine

## 2018-06-02 DIAGNOSIS — J320 Chronic maxillary sinusitis: Secondary | ICD-10-CM | POA: Diagnosis not present

## 2018-06-02 MED ORDER — PREDNISONE 10 MG PO TABS: ORAL_TABLET | ORAL | 0 refills | Status: DC

## 2018-06-02 MED ORDER — DOXYCYCLINE HYCLATE 100 MG PO TABS: 100.0000 mg | ORAL_TABLET | Freq: Two times a day (BID) | ORAL | 0 refills | Status: DC

## 2018-06-02 MED ORDER — AZITHROMYCIN 250 MG PO TABS: 250.0000 mg | ORAL_TABLET | Freq: Every day | ORAL | 0 refills | Status: DC

## 2018-06-02 MED ORDER — CLINDAMYCIN HCL 300 MG PO CAPS: 300.0000 mg | ORAL_CAPSULE | Freq: Three times a day (TID) | ORAL | 0 refills | Status: DC

## 2018-06-02 NOTE — Patient Instructions (Signed)
I am treating you for chronic sinusitis.  This will require 2 weeks of antibitoics  Doxycycline twice daily for  14 days  Clindamycin  3 times daily for 14 days   Prednisone 60 mg daily for 3 days, then begin taper   Daily use of Probiotics for  At least 5   weeks   Is  advised to reduce risk of C dificile colitis.

## 2018-06-02 NOTE — Progress Notes (Signed)
Subjective:  Patient ID: Nicole Swanson, female    DOB: [birth year]  Age: 62 y.o. MRN: [id number]  CC: The encounter diagnosis was Chronic sinusitis of both maxillary sinuses.  HPI Nicole Swanson presents for evaluation and treatment of persistent symptoms of sinusitis.  Symptoms  First occurred in early spring and she was treated with doxycycline for 10 days  Have been recurrent since the first treatment in  Spring    Treated in early august for same with doxycycline x 10 days  When symptoms failed to resolve after 3 weeks of supportive care.  Patient has allergic rhinitis and already takes triple therapy (steroid nasal spray, oral antihistamine and leuotriene inhibitor)  Symptoms improved transiently but returned and she was treated in August for same .  She is PCN allergic  And has a cardiac history fos F/Q were contraindicated. .  Symptoms again transiently improved but did not resolve    History of sinus surgery years ago by ENT for recurrent sinusitis. Helped for many years  Has not had sinusitis until this year.  No new environmental irritants.    Currently had frontal and maxillary sinus pain,  Ear pain,  Altered sense of smell/taste, and malaise.    Outpatient Medications Prior to Visit  Medication Sig Dispense Refill  . cholecalciferol (VITAMIN D) 1000 units tablet Take 1,000 Units by mouth daily.    . Cranberry 1000 MG CAPS Take by mouth.    . dicyclomine (BENTYL) 20 MG tablet Take 1 tablet (20 mg total) 3 (three) times daily as needed by mouth for spasms. 30 tablet 1  . docusate sodium (COLACE) 250 MG capsule Take 250 mg by mouth 2 (two) times daily.    . DULoxetine (CYMBALTA) 60 MG capsule Take 1 capsule (60 mg total) by mouth daily. TAKE ONE 30 MG CAPSULE BY MOUTH ONCE A DAY FOR THE FIRST WEEK. THEN INCREASE TO TWO 30 MG CAPSULES ( TOTAL 60MG ) BY MOUTH ONCE DAILY. 30 capsule 5  . famotidine (PEPCID) 20 MG tablet Take 1 tablet (20 mg total) by mouth 2 (two) times daily. 60 tablet  0  . fexofenadine (ALLEGRA) 180 MG tablet Take 180 mg by mouth daily as needed for allergies or rhinitis.     . fluticasone (FLONASE) 50 MCG/ACT nasal spray USE 1 SPRAY IN EACH NOSTRIL DAILY 16 g 5  . montelukast (SINGULAIR) 10 MG tablet TAKE 1 TABLET BY MOUTH AT BEDTIME. 90 tablet 1  . omeprazole (PRILOSEC) 40 MG capsule Take 1 capsule (40 mg total) by mouth 2 (two) times daily. 90 capsule 0  . ondansetron (ZOFRAN) 4 MG tablet Take 1 tablet (4 mg total) by mouth every 8 (eight) hours as needed for nausea or vomiting. 30 tablet 0  . senna (SENOKOT) 8.6 MG tablet Take 1 tablet by mouth as needed for constipation.    . sotalol (BETAPACE) 120 MG tablet TAKE 1 TABLET BY MOUTH 2 TIMES DAILY 180 tablet 2  . aspirin EC 81 MG tablet Take 81 mg by mouth daily.    . Calcium Carbonate-Vitamin D (CALCIUM-VITAMIN D) 500-200 MG-UNIT tablet Take 1 tablet by mouth daily.    Marland Kitchen doxycycline (VIBRA-TABS) 100 MG tablet Take 1 tablet (100 mg total) by mouth 2 (two) times daily. (Patient not taking: Reported on 06/02/2018) 20 tablet 0  . ergocalciferol (DRISDOL) 50000 units capsule Take 1 capsule (50,000 Units total) by mouth once a week. (Patient not taking: Reported on 06/02/2018) 12 capsule 0  . predniSONE (DELTASONE)  10 MG tablet 6 tablets on Day 1 , then reduce by 1 tablet daily until gone (Patient not taking: Reported on 04/09/2018) 21 tablet 0   No facility-administered medications prior to visit.     Review of Systems;  Patient denies headache, fevers, malaise, unintentional weight loss, skin rash, eye pain, sinus congestion and sinus pain, sore throat, dysphagia,  hemoptysis , cough, dyspnea, wheezing, chest pain, palpitations, orthopnea, edema, abdominal pain, nausea, melena, diarrhea, constipation, flank pain, dysuria, hematuria, urinary  Frequency, nocturia, numbness, tingling, seizures,  Focal weakness, Loss of consciousness,  Tremor, insomnia, depression, anxiety, and suicidal ideation.      Objective:    BP 110/66 (BP Location: Left Arm, Patient Position: Sitting, Cuff Size: Normal)   Pulse 69   Temp 97.9 F (36.6 C) (Oral)   Resp 14   Ht 5\' 7"  (1.702 m)   Wt 153 lb 3.2 oz (69.5 kg)   SpO2 97%   BMI 23.99 kg/m   BP Readings from Last 3 Encounters:  06/02/18 110/66  04/09/18 104/62  02/07/18 130/76    Wt Readings from Last 3 Encounters:  06/02/18 153 lb 3.2 oz (69.5 kg)  04/09/18 154 lb 6.4 oz (70 kg)  02/07/18 153 lb 3.2 oz (69.5 kg)    General appearance: alert, cooperative and appears stated age Ears: bilateral injected  TM's and external ear canals both ears Face: bilateral tenderness over frontal and maxillary sinuses  Throat: lips, mucosa, and tongue normal; teeth and gums normal Neck: tender cervical  adenopathy, no carotid bruit, supple, symmetrical, trachea midline and thyroid not enlarged, symmetric, no tenderness/mass/nodules Back: symmetric, no curvature. ROM normal. No CVA tenderness. Lungs: clear to auscultation bilaterally Heart: regular rate and rhythm, S1, S2 normal, no murmur, click, rub or gallop Abdomen: soft, non-tender; bowel sounds normal; no masses,  no organomegaly Pulses: 2+ and symmetric Skin: Skin color, texture, turgor normal. No rashes or lesions Lymph nodes: Cervical, supraclavicular, and axillary nodes normal.  No results found for: HGBA1C  Lab Results  Component Value Date   CREATININE 0.87 02/21/2018   CREATININE 0.92 07/30/2017   CREATININE 0.79 01/29/2017    Lab Results  Component Value Date   WBC 4.5 02/21/2018   HGB 13.3 02/21/2018   HCT 40.2 02/21/2018   PLT 179.0 02/21/2018   GLUCOSE 114 (H) 02/21/2018   CHOL 226 (H) 02/21/2018   TRIG 78.0 02/21/2018   HDL 85.70 02/21/2018   LDLCALC 125 (H) 02/21/2018   ALT 13 02/21/2018   AST 16 02/21/2018   NA 140 02/21/2018   K 4.1 02/21/2018   CL 103 02/21/2018   CREATININE 0.87 02/21/2018   BUN 17 02/21/2018   CO2 28 02/21/2018   TSH 1.38 02/21/2018   INR 0.99 06/19/2016     Mm 3d Screen Breast Bilateral  Result Date: 05/22/2018 CLINICAL DATA:  Screening. EXAM: DIGITAL SCREENING BILATERAL MAMMOGRAM WITH TOMO AND CAD COMPARISON:  Previous exam(s). ACR Breast Density Category b: There are scattered areas of fibroglandular density. FINDINGS: There are no findings suspicious for malignancy. Images were processed with CAD. IMPRESSION: No mammographic evidence of malignancy. A result letter of this screening mammogram will be mailed directly to the patient. RECOMMENDATION: Screening mammogram in one year. (Code:SM-B-01Y) BI-RADS CATEGORY  1: Negative. Electronically Signed   By: Dorise Bullion III M.D   On: 05/22/2018 08:28    Assessment & Plan:   Problem List Items Addressed This Visit    Chronic sinusitis of both maxillary sinuses  Choice of antibiotics is limited by her PCN allergy and need to avoid meds that can prolong her QT interval..  Will repeat doxycycline and add clindamycin for aneerobic coverage, and add prednisone 60 mg qd x 3, followed by taper>  Will need ENT eval if not resolved.  Probiotic advised.       Relevant Medications   clindamycin (CLEOCIN) 300 MG capsule   predniSONE (DELTASONE) 10 MG tablet   doxycycline (VIBRA-TABS) 100 MG tablet    A total of 25 minutes of face to face time was spent with patient more than half of which was spent in counselling about the above mentioned conditions  and coordination of care   I have discontinued Irene A. Holeman's calcium-vitamin D, aspirin EC, predniSONE, ergocalciferol, doxycycline, and azithromycin. I am also having her start on clindamycin, predniSONE, and doxycycline. Additionally, I am having her maintain her docusate sodium, fexofenadine, Cranberry, famotidine, senna, dicyclomine, ondansetron, sotalol, montelukast, DULoxetine, omeprazole, fluticasone, and cholecalciferol.  Meds ordered this encounter  Medications  . DISCONTD: azithromycin (ZITHROMAX) 250 MG tablet    Sig: Take 1 tablet (250 mg  total) by mouth daily.    Dispense:  14 each    Refill:  0  . clindamycin (CLEOCIN) 300 MG capsule    Sig: Take 1 capsule (300 mg total) by mouth 3 (three) times daily.    Dispense:  42 capsule    Refill:  0  . predniSONE (DELTASONE) 10 MG tablet    Sig: 6 tablets all at once on Day 1,2 and 3,    Then taper by 1 tablet daily until gone    Dispense:  33 tablet    Refill:  0  . doxycycline (VIBRA-TABS) 100 MG tablet    Sig: Take 1 tablet (100 mg total) by mouth 2 (two) times daily.    Dispense:  28 tablet    Refill:  0    INSTEAD OF AZITHROMYCIN    Medications Discontinued During This Encounter  Medication Reason  . aspirin EC 81 MG tablet Patient Preference  . Calcium Carbonate-Vitamin D (CALCIUM-VITAMIN D) 500-200 MG-UNIT tablet Patient Preference  . doxycycline (VIBRA-TABS) 100 MG tablet Completed Course  . ergocalciferol (DRISDOL) 50000 units capsule Completed Course  . predniSONE (DELTASONE) 10 MG tablet Completed Course  . azithromycin (ZITHROMAX) 250 MG tablet     Follow-up: No follow-ups on file.   [name], MD

## 2018-06-03 DIAGNOSIS — J320 Chronic maxillary sinusitis: Secondary | ICD-10-CM | POA: Insufficient documentation

## 2018-06-03 NOTE — Assessment & Plan Note (Signed)
Choice of antibiotics is limited by her PCN allergy and need to avoid meds that can prolong her QT interval..  Will repeat doxycycline and add clindamycin for aneerobic coverage, and add prednisone 60 mg qd x 3, followed by taper>  Will need ENT eval if not resolved.  Probiotic advised.

## 2018-06-09 ENCOUNTER — Encounter: Payer: Self-pay | Admitting: Internal Medicine

## 2018-06-09 ENCOUNTER — Telehealth: Payer: Self-pay | Admitting: Internal Medicine

## 2018-07-02 ENCOUNTER — Encounter: Payer: Self-pay | Admitting: Internal Medicine

## 2018-07-02 DIAGNOSIS — H5213 Myopia, bilateral: Secondary | ICD-10-CM | POA: Diagnosis not present

## 2018-07-17 ENCOUNTER — Ambulatory Visit (INDEPENDENT_AMBULATORY_CARE_PROVIDER_SITE_OTHER): Payer: 59 | Admitting: Internal Medicine

## 2018-07-17 VITALS — BP 124/74 | HR 61 | Ht 67.0 in | Wt 153.0 lb

## 2018-07-17 DIAGNOSIS — Z79899 Other long term (current) drug therapy: Secondary | ICD-10-CM

## 2018-07-17 DIAGNOSIS — I493 Ventricular premature depolarization: Secondary | ICD-10-CM | POA: Diagnosis not present

## 2018-07-17 DIAGNOSIS — Z95810 Presence of automatic (implantable) cardiac defibrillator: Secondary | ICD-10-CM

## 2018-07-17 DIAGNOSIS — I469 Cardiac arrest, cause unspecified: Secondary | ICD-10-CM

## 2018-07-17 NOTE — Patient Instructions (Signed)
Medication Instructions:  - Your physician recommends that you continue on your current medications as directed. Please refer to the Current Medication list given to you today.  If you need a refill on your cardiac medications before your next appointment, please call your pharmacy.   Lab work: - Your physician recommends that you have lab work today: BMP/ Magnesium  If you have labs (blood work) drawn today and your tests are completely normal, you will receive your results only by: Marland Kitchen MyChart Message (if you have MyChart) OR . A paper copy in the mail If you have any lab test that is abnormal or we need to change your treatment, we will call you to review the results.  Testing/Procedures: - none ordered  Follow-Up: At [facility], you and your health needs are our priority.  As part of our continuing mission to provide you with exceptional heart care, we have created designated Provider Care Teams.  These Care Teams include your primary Cardiologist (physician) and Advanced Practice Providers (APPs -  Physician Assistants and Nurse Practitioners) who all work together to provide you with the care you need, when you need it. . You will need a follow up appointment in 6 months with Dr. [name].  Please call our office 2 months in advance to schedule this appointment.    Remote monitoring is used to monitor your Pacemaker of ICD from home. This monitoring reduces the number of office visits required to check your device to one time per year. It allows [place] to keep an eye on the functioning of your device to ensure it is working properly. You are scheduled for a device check from home on 07/30/18. You may send your transmission at any time that day. If you have a wireless device, the transmission will be sent automatically. After your physician reviews your transmission, you will receive a postcard with your next transmission date.   Any Other Special Instructions Will Be Listed Below (If  Applicable). - N/A

## 2018-07-17 NOTE — Progress Notes (Signed)
Patient Care Team: [name], MD as PCP - General (Internal Medicine)   HPI  Nicole Swanson is a 62 y.o. female Seen in followup for ICD implanted for aborted cardiac arrest. Her care initially had been at [facility]. She is status post dual chamber ICD implantation and generator replacement was recently done by me summer 2013   Echocardiogram 2011 demonstrated normal left ventricular function valve function and dimension sizes   She also has a history of ventricular ectopy for which she has been on sotalol in which Dr. Sharon Seller was going to increase prior to her transferring her care to [facility]   Her husband died 49 .     She continues with infrequent palpitations.  Exercise tolerance is mildly impaired.  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  Date Cr Mg K  4/15  0.9 2.4    10/15  0.9   4.5  11/17 0.7  3.9  2/18 0.64  4.0  5/18 0.79 2.5 4.5  11/18 0.92  4.3  6/19 0.87 2.5 4.1        Device History: MDT dual chamber ICD implanted 1997 for aborted cardiac arrest; gen change 2007; gen change 2013 History of appropriate therapy: Yes History of AAD therapy: Yes - sotalol  DATE TEST    10/17    Myoview   EF 65 % No ischemia  10/17 Echo EF 65%      Past Medical History:  Diagnosis Date  . Aborted cardiac arrest 1994  . AICD (automatic cardioverter/defibrillator) present   . Allergy   . Anginal pain ([place])   . Arthritis   . Asthma    hx of asthma related to air bag release   . Dual implantable cardiac defibrillator    Medtronic initially implanted in 1997  . DVT (deep vein thrombosis) in pregnancy ([place]) 1986   had heparin shots followed by coumadin  . Fall    10/11/17 walking dogs   . Family history of seizures    Question neurological versus cardiac  . Heart murmur   . Irritable bowel syndrome   . 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 PONV (postoperative nausea and vomiting)   . Presence of  permanent cardiac pacemaker   . PVC's (premature ventricular contractions)   . Raynaud's syndrome   . Shortness of breath dyspnea    with exertion   . Ventricular tachycardia [facility])     Past Surgical History:  Procedure Laterality Date  . ABDOMINAL HYSTERECTOMY    . APPENDECTOMY    . CARDIAC DEFIBRILLATOR PLACEMENT  1994   Dual chamber with pacemaker  . CARPAL TUNNEL RELEASE Right   . COLONOSCOPY WITH PROPOFOL N/A 01/18/2017   Procedure: COLONOSCOPY WITH PROPOFOL;  Surgeon: Manya Silvas, MD;  Location[facility] END[facility];  Service: Endoscopy;  Laterality: N/A;  . CYSTOCELE REPAIR N/A 06/19/2016   Procedure: ANTERIOR REPAIR (CYSTOCELE) WITH VAULT PROLAPSE;  Surgeon: Bjorn Loser, MD;  Location: [facility] ORS;  Service: Urology;  Laterality: N/A;  . CYSTOSCOPY N/A 06/19/2016   Procedure: CYSTOSCOPY;  Surgeon: Bjorn Loser, MD;  Location: [facility] ORS;  Service: Urology;  Laterality: N/A;  . ESOPHAGOGASTRODUODENOSCOPY (EGD) WITH PROPOFOL N/A 01/18/2017   Procedure: ESOPHAGOGASTRODUODENOSCOPY (EGD) WITH PROPOFOL;  Surgeon: Manya Silvas, MD;  Location: [facility] ENDOSCOPY;  Service: Endoscopy;  Laterality: N/A;  . LAPAROSCOPIC LYSIS OF ADHESIONS    . NASAL SEPTUM SURGERY  2006  [employer]   . OOPHORECTOMY    . PACEMAKER INSERTION     5 different devices    Current Outpatient Medications  Medication Sig Dispense Refill  . cholecalciferol (VITAMIN D) 1000 units tablet Take 1,000 Units by mouth daily.    . Cranberry 1000 MG CAPS Take by mouth.    . dicyclomine (BENTYL) 20 MG tablet Take 1 tablet (20 mg total) 3 (three) times daily as needed by mouth for spasms. 30 tablet 1  . docusate sodium (COLACE) 250 MG capsule Take 250 mg by mouth 2 (two) times daily.    . DULoxetine (CYMBALTA) 60 MG capsule Take 1 capsule (60 mg total) by mouth daily. TAKE ONE 30 MG CAPSULE BY MOUTH ONCE A DAY FOR THE FIRST WEEK. THEN INCREASE TO TWO 30 MG CAPSULES ( TOTAL 60MG ) BY MOUTH ONCE DAILY. 30 capsule 5  .  famotidine (PEPCID) 20 MG tablet Take 1 tablet (20 mg total) by mouth 2 (two) times daily. 60 tablet 0  . fexofenadine (ALLEGRA) 180 MG tablet Take 180 mg by mouth daily as needed for allergies or rhinitis.     . fluticasone (FLONASE) 50 MCG/ACT nasal spray USE 1 SPRAY IN EACH NOSTRIL DAILY 16 g 5  . montelukast (SINGULAIR) 10 MG tablet TAKE 1 TABLET BY MOUTH AT BEDTIME. 90 tablet 1  . omeprazole (PRILOSEC) 40 MG capsule Take 1 capsule (40 mg total) by mouth 2 (two) times daily. 90 capsule 0  . ondansetron (ZOFRAN) 4 MG tablet Take 1 tablet (4 mg total) by mouth every 8 (eight) hours as needed for nausea or vomiting. 30 tablet 0  . predniSONE (DELTASONE) 10 MG tablet 6 tablets all at once on Day 1,2 and 3,    Then taper by 1 tablet daily until gone 33 tablet 0  . senna (SENOKOT) 8.6 MG tablet Take 1 tablet by mouth as needed for constipation.    . sotalol (BETAPACE) 120 MG tablet TAKE 1 TABLET BY MOUTH 2 TIMES DAILY 180 tablet 2   No current facility-administered medications for this visit.     Allergies  Allergen Reactions  . Vancomycin Rash    Other Reaction: RASH & WELTS  . Penicillins Rash    Has patient had a PCN reaction causing immediate rash, facial/tongue/throat swelling, SOB or lightheadedness with hypotension: No Has patient had a PCN reaction causing severe rash involving mucus membranes or skin necrosis: No Has patient had a PCN reaction that required hospitalization: No Has patient had a PCN reaction occurring within the last 10 years: No If all of the above answers are "NO", then may proceed with Cephalosporin use.   . Sulfa Antibiotics Diarrhea    Review of Systems negative except from HPI and PMH  Physical Exam BP 124/74 (BP Location: Left Arm, Patient Position: Sitting, Cuff Size: Normal)   Pulse 61   Ht 5\' 7"  (1.702 m)   Wt 153 lb (69.4 kg)   BMI 23.96 kg/m  Well developed and nourished in no acute distress HENT normal Neck supple with JVP-flat Clear Regular  rate and rhythm, no murmurs or gallops Abd-soft with active BS No Clubbing cyanosis edema Skin-warm and dry A & Oriented  Grossly normal sensory and motor function   ECG atrially paced at 61 Intervals 21/06/44  Device was interrogated.  No interval episodes.  Battery voltage was normal.  Pacing thresholds were both at 0.5 at 0.5.  Impedances were normal and stable.  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  The information was reviewed. No changes were made in the programming.     Abnormal ECG     .No intercurrent Ventricular tachycardia  Will check sotalol labs

## 2018-07-18 LAB — BASIC METABOLIC PANEL
BUN/Creatinine Ratio: 17 (ref 12–28)
BUN: 12 mg/dL (ref 8–27)
CALCIUM: 9.9 mg/dL (ref 8.7–10.3)
CO2: 24 mmol/L (ref 20–29)
Chloride: 103 mmol/L (ref 96–106)
Creatinine, Ser: 0.69 mg/dL (ref 0.57–1.00)
GFR, EST AFRICAN AMERICAN: 106 mL/min/{1.73_m2} (ref >59)
GFR, EST NON AFRICAN AMERICAN: 92 mL/min/{1.73_m2} (ref >59)
Glucose: 89 mg/dL (ref 65–99)
Potassium: 4.6 mmol/L (ref 3.5–5.2)
Sodium: 142 mmol/L (ref 134–144)

## 2018-07-18 LAB — MAGNESIUM: Magnesium: 2.5 mg/dL — ABNORMAL HIGH (ref 1.6–2.3)

## 2018-07-29 DIAGNOSIS — R0609 Other forms of dyspnea: Secondary | ICD-10-CM | POA: Diagnosis not present

## 2018-07-29 DIAGNOSIS — J449 Chronic obstructive pulmonary disease, unspecified: Secondary | ICD-10-CM | POA: Diagnosis not present

## 2018-07-30 ENCOUNTER — Ambulatory Visit (INDEPENDENT_AMBULATORY_CARE_PROVIDER_SITE_OTHER): Payer: 59

## 2018-07-30 DIAGNOSIS — I469 Cardiac arrest, cause unspecified: Secondary | ICD-10-CM | POA: Diagnosis not present

## 2018-07-30 DIAGNOSIS — I472 Ventricular tachycardia, unspecified: Secondary | ICD-10-CM

## 2018-07-30 NOTE — Progress Notes (Signed)
Remote ICD transmission.   

## 2018-08-11 ENCOUNTER — Telehealth: Payer: 59 | Admitting: Nurse Practitioner

## 2018-08-11 ENCOUNTER — Ambulatory Visit: Payer: Self-pay | Admitting: Internal Medicine

## 2018-08-11 DIAGNOSIS — R110 Nausea: Secondary | ICD-10-CM

## 2018-08-11 NOTE — Progress Notes (Signed)
Based on what you shared with me it looks like you have issues,that should be evaluated in a face to face office visit. Due to changes describe in bowel habits you need to see PCP or GI specialist. You may need to use pepto bismol OTC for nausea.  NOTE: If you entered your credit card information for this eVisit, you will not be charged. You may see a "hold" on your card for the $30 but that hold will drop off and you will not have a charge processed.  If you are having a true medical emergency please call 911.  If you need an urgent face to face visit, [place] has four urgent care centers for your convenience.  If you need care fast and have a high deductible or no insurance consider:   [email or web address] to reserve your spot online an avoid wait times  [facility] [address], [address] , [place] [address] 8 am to 8 pm Monday-Friday 10 am to 4 pm Saturday-Sunday *Across the street from [employer]  [place], [address] 8 am to 5 pm Monday-Friday * In the [facility] on the [facility]   The following sites will take your  insurance:  . [facility] [place] a Provider at this Location  [address] [place], [place] [address] . 10 am to 8 pm Monday-Friday . 12 pm to 8 pm Saturday-Sunday   . [facility] [place] a Provider at this Location  [address], [place], [place] [address] . 8 am to 8 pm Monday-Friday . 9 am to 6 pm Saturday . 11 am to 6 pm Sunday   . [facility] at [place] Get Driving Directions  [address] [place], [place] [address] . 8 am to 8 pm Monday-Friday . 8 am to 4 pm Saturday-Sunday   Your e-visit answers were reviewed by a board certified advanced clinical practitioner to  complete your personal care plan.

## 2018-08-13 ENCOUNTER — Encounter: Payer: Self-pay | Admitting: Family Medicine

## 2018-08-13 ENCOUNTER — Ambulatory Visit (INDEPENDENT_AMBULATORY_CARE_PROVIDER_SITE_OTHER): Payer: 59 | Admitting: Family Medicine

## 2018-08-13 VITALS — BP 118/76 | HR 93 | Temp 98.0°F | Ht 67.0 in | Wt 150.0 lb

## 2018-08-13 DIAGNOSIS — R1030 Lower abdominal pain, unspecified: Secondary | ICD-10-CM | POA: Diagnosis not present

## 2018-08-13 DIAGNOSIS — N390 Urinary tract infection, site not specified: Secondary | ICD-10-CM

## 2018-08-13 DIAGNOSIS — K581 Irritable bowel syndrome with constipation: Secondary | ICD-10-CM

## 2018-08-13 DIAGNOSIS — K8020 Calculus of gallbladder without cholecystitis without obstruction: Secondary | ICD-10-CM | POA: Diagnosis not present

## 2018-08-13 DIAGNOSIS — R112 Nausea with vomiting, unspecified: Secondary | ICD-10-CM | POA: Diagnosis not present

## 2018-08-13 DIAGNOSIS — R350 Frequency of micturition: Secondary | ICD-10-CM

## 2018-08-13 DIAGNOSIS — R197 Diarrhea, unspecified: Secondary | ICD-10-CM

## 2018-08-13 LAB — CBC WITH DIFFERENTIAL/PLATELET
BASOS PCT: 0.9 % (ref 0.0–3.0)
Basophils Absolute: 0.1 10*3/uL (ref 0.0–0.1)
Eosinophils Absolute: 0.2 10*3/uL (ref 0.0–0.7)
Eosinophils Relative: 3 % (ref 0.0–5.0)
HEMATOCRIT: 44 % (ref 36.0–46.0)
HEMOGLOBIN: 14.3 g/dL (ref 12.0–15.0)
LYMPHS PCT: 15.3 % (ref 12.0–46.0)
Lymphs Abs: 1 10*3/uL (ref 0.7–4.0)
MCHC: 32.5 g/dL (ref 30.0–36.0)
MCV: 93.2 fl (ref 78.0–100.0)
MONO ABS: 0.4 10*3/uL (ref 0.1–1.0)
Monocytes Relative: 6.8 % (ref 3.0–12.0)
NEUTROS PCT: 74 % (ref 43.0–77.0)
Neutro Abs: 4.8 10*3/uL (ref 1.4–7.7)
Platelets: 179 10*3/uL (ref 150.0–400.0)
RBC: 4.73 Mil/uL (ref 3.87–5.11)
RDW: 13.5 % (ref 11.5–15.5)
WBC: 6.5 10*3/uL (ref 4.0–10.5)

## 2018-08-13 LAB — COMPREHENSIVE METABOLIC PANEL
ALK PHOS: 96 U/L (ref 39–117)
ALT: 8 U/L (ref 0–35)
AST: 14 U/L (ref 0–37)
Albumin: 4.4 g/dL (ref 3.5–5.2)
BUN: 10 mg/dL (ref 6–23)
CALCIUM: 9.9 mg/dL (ref 8.4–10.5)
CO2: 30 meq/L (ref 19–32)
CREATININE: 0.88 mg/dL (ref 0.40–1.20)
Chloride: 104 mEq/L (ref 96–112)
GFR: 68.62 mL/min (ref >60.00)
Glucose, Bld: 110 mg/dL — ABNORMAL HIGH (ref 70–99)
POTASSIUM: 4.4 meq/L (ref 3.5–5.1)
SODIUM: 141 meq/L (ref 135–145)
Total Bilirubin: 0.7 mg/dL (ref 0.2–1.2)
Total Protein: 6.8 g/dL (ref 6.0–8.3)

## 2018-08-13 LAB — POCT URINALYSIS DIPSTICK
BILIRUBIN UA: NEGATIVE
Blood, UA: NEGATIVE
Glucose, UA: NEGATIVE
Ketones, UA: NEGATIVE
LEUKOCYTES UA: NEGATIVE
Nitrite, UA: NEGATIVE
Protein, UA: NEGATIVE
Spec Grav, UA: 1.02 (ref 1.010–1.025)
UROBILINOGEN UA: 0.2 U/dL
pH, UA: 6 (ref 5.0–8.0)

## 2018-08-13 LAB — LIPASE: LIPASE: 21 U/L (ref 11.0–59.0)

## 2018-08-13 LAB — AMYLASE: AMYLASE: 37 U/L (ref 27–131)

## 2018-08-13 MED ORDER — ONDANSETRON HCL 4 MG PO TABS: 4.0000 mg | ORAL_TABLET | Freq: Three times a day (TID) | ORAL | 0 refills | Status: AC | PRN

## 2018-08-13 MED ORDER — FAMOTIDINE 20 MG PO TABS: 20.0000 mg | ORAL_TABLET | Freq: Two times a day (BID) | ORAL | 0 refills | Status: DC

## 2018-08-13 MED ORDER — FAMOTIDINE 20 MG PO TABS: 20.0000 mg | ORAL_TABLET | Freq: Two times a day (BID) | ORAL | 2 refills | Status: DC

## 2018-08-13 MED ORDER — DICYCLOMINE HCL 20 MG PO TABS: 20.0000 mg | ORAL_TABLET | Freq: Three times a day (TID) | ORAL | 1 refills | Status: AC | PRN

## 2018-08-13 NOTE — Progress Notes (Signed)
Subjective:    Patient ID: Nicole Swanson, female    DOB: 1955-09-14, 62 y.o.   MRN: [id number]  HPI  Presents to clinic c/o nausea, vomiting and diarrhea for  4-5  Days.  Patient does have a history of IBS and also gallstones.  Patient has used Bentyl in the past to help abdominal pain, but currently is out of her Bentyl prescription.  Patient has not eaten much over the past couple of days, has been eating bland foods and sipping on water.  Patient states the vomiting is better today, but still is having a lot of loose stools.  Denies fever or chills.  Denies shortness of breath, chest pain feeling faint or passing out.  Patient Active Problem List   Diagnosis Date Noted  . Chronic sinusitis of both maxillary sinuses 06/03/2018  . Encounter for preventive health examination 02/09/2018  . Eustachian tube disorder, left 07/08/2017  . Cystocele, midline 06/19/2016  . Cardiac arrest ([place]) 05/14/2016  . Ventricular premature depolarization 05/14/2016  . Non-dose-related adverse reaction to medication 03/13/2016  . Urinary frequency 09/16/2015  . Bladder prolapse, female, acquired 09/08/2015  . Other intervertebral disc degeneration, lumbar region 01/25/2015  . Sacroiliac joint dysfunction of both sides 01/25/2015  . Spinal stenosis, lumbar region, with neurogenic claudication 01/25/2015  . Facet syndrome, lumbar 01/25/2015  . Lumbar radiculopathy 01/25/2015  . Calculus of gallbladder without cholecystitis without obstruction 12/23/2014  . Polyarthritis 03/16/2014  . Thrombocytopenia ([place]) 09/11/2013  . Other malaise 09/10/2013  . Major depressive disorder, single episode 09/10/2013  . Raynaud phenomenon 04/17/2013  . Raynaud's syndrome without gangrene 04/17/2013  . Low back pain 12/05/2012  . Shortness of breath 06/30/2012  . Aborted cardiac arrest   . Presence of automatic implantable cardioverter-defibrillator   . PVC's / ventricular tachycardia   . Lumbago due to displacement of  intervertebral disc   . Irritable bowel syndrome with constipation    Social History   Tobacco Use  . Smoking status: Former Smoker    Types: Cigarettes    Last attempt to quit: 06/04/1995    Years since quitting: 23.2  . Smokeless tobacco: Never Used  Substance Use Topics  . Alcohol use: Yes    Alcohol/week: 0.0 standard drinks    Comment: occasional    Review of Systems  Constitutional: Negative for chills, fatigue and fever.  HENT: Negative for congestion, ear pain, sinus pain and sore throat.   Eyes: Negative.   Respiratory: Negative for cough, shortness of breath and wheezing.   Cardiovascular: Negative for chest pain, palpitations and leg swelling.  Gastrointestinal: +abdominal pain, diarrhea, nausea and vomiting.  Genitourinary: Negative for dysuria, frequency and urgency.  Musculoskeletal: Negative for arthralgias and myalgias.  Skin: Negative for color change, pallor and rash.  Neurological: Negative for syncope, light-headedness and headaches.  Psychiatric/Behavioral: The patient is not nervous/anxious.       Objective:   Physical Exam  Constitutional: She appears well-developed and well-nourished.  She is nontoxic. HENT:  Head: Normocephalic and atraumatic.  Eyes: EOM are normal. No scleral icterus.  Neck: Normal range of motion. Neck supple. No tracheal deviation present.  Cardiovascular: Normal rate, regular rhythm and normal heart sounds.  Pulmonary/Chest: Effort normal and breath sounds normal. No respiratory distress. She has no wheezes. She has no rales.  Abdominal: Soft. Bowel sounds are normal.  Positive generalized lower abdomen tenderness.  No mass or guarding.  No rebound tenderness. Neurological: She is alert and oriented to person, place, and time.  Gait normal  Skin: Skin is warm and dry. No pallor.  Psychiatric: She has a normal mood and affect. Her behavior is normal. Thought content normal.  Nursing note and vitals reviewed.     Vitals:    08/13/18 0921  BP: 118/76  Pulse: 93  Temp: 98 F (36.7 C)  SpO2: 96%   Assessment & Plan:   Lower abdominal pain/nausea vomiting and diarrhea - we will get blood work including CBC, CMP, amylase and lipase.  Patient does have a history of calculus of gallbladder so we will get CT scan of abdomen pelvis to further investigate possible cause of pain.  Patient also has a history of IBS, so her symptoms could be from an IBS flareup.  Bentyl prescription refilled to help calm abdominal spasms, she will use Zofran as needed for nausea and also restart taking Pepcid once a day.  Patient aware that she will be contacted in regards to CT scan appointment being set up.  Once we have results of blood work and CT scan we will better be able to determine next step in plan of care.  Patient and I discussed that it is possible she may need to see GI if her symptoms do not improve.

## 2018-08-15 ENCOUNTER — Ambulatory Visit
Admission: RE | Admit: 2018-08-15 | Discharge: 2018-08-15 | Disposition: A | Discharge status: Home / Self Care | Payer: 59 | Source: Ambulatory Visit | Attending: Family Medicine | Admitting: Family Medicine

## 2018-08-15 DIAGNOSIS — R197 Diarrhea, unspecified: Secondary | ICD-10-CM | POA: Insufficient documentation

## 2018-08-15 DIAGNOSIS — K5730 Diverticulosis of large intestine without perforation or abscess without bleeding: Secondary | ICD-10-CM | POA: Diagnosis not present

## 2018-08-15 DIAGNOSIS — R1030 Lower abdominal pain, unspecified: Secondary | ICD-10-CM | POA: Insufficient documentation

## 2018-08-15 DIAGNOSIS — R112 Nausea with vomiting, unspecified: Secondary | ICD-10-CM | POA: Diagnosis not present

## 2018-08-15 LAB — URINE CULTURE
MICRO NUMBER:: 91483650
SPECIMEN QUALITY:: ADEQUATE

## 2018-08-15 MED ORDER — IOPAMIDOL (ISOVUE-300) INJECTION 61%
100.0000 mL | Freq: Once | INTRAVENOUS | Status: AC | PRN
  Administered 2018-08-15: 100 mL via INTRAVENOUS

## 2018-08-15 MED ORDER — CIPROFLOXACIN HCL 500 MG PO TABS: 500.0000 mg | ORAL_TABLET | Freq: Two times a day (BID) | ORAL | 0 refills | Status: DC

## 2018-08-15 NOTE — Addendum Note (Signed)
Addended by: Philis Nettle on: 08/15/2018 08:09 AM   Modules accepted: Orders

## 2018-09-01 ENCOUNTER — Other Ambulatory Visit: Payer: Self-pay | Admitting: Internal Medicine

## 2018-09-12 DIAGNOSIS — J449 Chronic obstructive pulmonary disease, unspecified: Secondary | ICD-10-CM | POA: Diagnosis not present

## 2018-09-19 LAB — CUP PACEART REMOTE DEVICE CHECK
Battery Voltage: 2.82 V
Brady Statistic AP VP Percent: 0.01 %
Brady Statistic AP VS Percent: 57.51 %
Brady Statistic AS VP Percent: 0.04 %
Brady Statistic AS VS Percent: 42.45 %
Brady Statistic RA Percent Paced: 57.36 %
Brady Statistic RV Percent Paced: 0.05 %
Date Time Interrogation Session: 20191127051809
HIGH POWER IMPEDANCE MEASURED VALUE: 81 Ohm
HighPow Impedance: 456 Ohm
HighPow Impedance: 55 Ohm
Implantable Lead Implant Date: 19970917
Implantable Lead Implant Date: 20070119
Implantable Lead Location: 753859
Implantable Lead Location: 753860
Implantable Lead Model: 5076
Implantable Lead Model: 6942
Implantable Pulse Generator Implant Date: 20130722
Lead Channel Impedance Value: 456 Ohm
Lead Channel Pacing Threshold Amplitude: 0.625 V
Lead Channel Pacing Threshold Pulse Width: 0.4 ms
Lead Channel Pacing Threshold Pulse Width: 0.4 ms
Lead Channel Sensing Intrinsic Amplitude: 10.5 mV
Lead Channel Sensing Intrinsic Amplitude: 10.5 mV
Lead Channel Setting Pacing Amplitude: 2 V
Lead Channel Setting Pacing Amplitude: 2.5 V
Lead Channel Setting Pacing Pulse Width: 0.4 ms
Lead Channel Setting Sensing Sensitivity: 0.45 mV
MDC IDC MSMT LEADCHNL RA SENSING INTR AMPL: 2 mV
MDC IDC MSMT LEADCHNL RA SENSING INTR AMPL: 2 mV
MDC IDC MSMT LEADCHNL RV IMPEDANCE VALUE: 456 Ohm
MDC IDC MSMT LEADCHNL RV PACING THRESHOLD AMPLITUDE: 0.875 V

## 2018-11-24 ENCOUNTER — Telehealth: Payer: Self-pay | Admitting: Internal Medicine

## 2018-11-24 ENCOUNTER — Other Ambulatory Visit: Payer: Self-pay | Admitting: *Deleted

## 2018-11-24 MED ORDER — SOTALOL HCL 120 MG PO TABS: 120.0000 mg | ORAL_TABLET | Freq: Two times a day (BID) | ORAL | 1 refills | Status: DC

## 2018-11-24 NOTE — Telephone Encounter (Signed)
Requested Prescriptions   Signed Prescriptions Disp Refills  . sotalol (BETAPACE) 120 MG tablet 180 tablet 1    Sig: Take 1 tablet (120 mg total) by mouth 2 (two) times daily.    Authorizing Provider: Deboraha Sprang    Ordering User: Britt Bottom

## 2018-11-24 NOTE — Telephone Encounter (Signed)
°*  STAT* If patient is at the pharmacy, call can be transferred to refill team.   1. Which medications need to be refilled? (please list name of each medication and dose if known) sotalol 120 MG 1 tablet 2 times daily   2. Which pharmacy/location (including street and city if local pharmacy) is medication to be sent to? [facility] on [address]  3. Do they need a 30 day or 90 day supply? 30 day

## 2018-12-03 ENCOUNTER — Other Ambulatory Visit: Payer: Self-pay | Admitting: *Deleted

## 2018-12-03 MED ORDER — SOTALOL HCL 120 MG PO TABS: 120.0000 mg | ORAL_TABLET | Freq: Two times a day (BID) | ORAL | 1 refills | Status: DC

## 2018-12-04 ENCOUNTER — Other Ambulatory Visit: Payer: Self-pay

## 2018-12-04 MED ORDER — MONTELUKAST SODIUM 10 MG PO TABS: 10.0000 mg | ORAL_TABLET | Freq: Every day | ORAL | 1 refills | Status: DC

## 2018-12-04 MED ORDER — OMEPRAZOLE 40 MG PO CPDR: 40.0000 mg | DELAYED_RELEASE_CAPSULE | Freq: Two times a day (BID) | ORAL | 1 refills | Status: DC

## 2018-12-09 ENCOUNTER — Other Ambulatory Visit: Payer: Self-pay

## 2018-12-18 ENCOUNTER — Other Ambulatory Visit: Payer: Self-pay

## 2018-12-18 MED ORDER — FLUTICASONE PROPIONATE 50 MCG/ACT NA SUSP: 1.0000 | Freq: Every day | NASAL | 5 refills | Status: DC

## 2018-12-23 ENCOUNTER — Encounter: Payer: Medicare HMO | Admitting: *Deleted

## 2018-12-23 ENCOUNTER — Other Ambulatory Visit: Payer: Self-pay

## 2018-12-24 ENCOUNTER — Telehealth: Payer: Self-pay | Admitting: Internal Medicine

## 2018-12-24 ENCOUNTER — Telehealth: Payer: Self-pay

## 2018-12-24 NOTE — Telephone Encounter (Signed)
Spoke with patient to remind of missed remote transmission 

## 2018-12-24 NOTE — Telephone Encounter (Signed)
Pt called to report she can not send remote trasmission.  Pt would like a call back to speak to some one about that.

## 2018-12-24 NOTE — Telephone Encounter (Signed)
Spoke w/ pt and she informed me her home monitor is not working. She has a 2490 + wirex. She stated that she has moved it around her home and it will not send. I instructed pt to call tech support to have a new monitor ordered.

## 2018-12-30 ENCOUNTER — Ambulatory Visit (INDEPENDENT_AMBULATORY_CARE_PROVIDER_SITE_OTHER): Payer: Medicare HMO | Admitting: *Deleted

## 2018-12-30 DIAGNOSIS — I469 Cardiac arrest, cause unspecified: Secondary | ICD-10-CM

## 2018-12-30 DIAGNOSIS — I472 Ventricular tachycardia, unspecified: Secondary | ICD-10-CM

## 2018-12-31 LAB — CUP PACEART REMOTE DEVICE CHECK
Battery Voltage: 2.69 V
Brady Statistic AP VP Percent: 0.03 %
Brady Statistic AP VS Percent: 69.87 %
Brady Statistic AS VP Percent: 0.02 %
Brady Statistic AS VS Percent: 30.08 %
Brady Statistic RA Percent Paced: 69.65 %
Brady Statistic RV Percent Paced: 0.05 %
Date Time Interrogation Session: 20200429002139
HighPow Impedance: 456 Ohm
HighPow Impedance: 52 Ohm
HighPow Impedance: 74 Ohm
Implantable Lead Implant Date: 19970917
Implantable Lead Implant Date: 20070119
Implantable Lead Location: 753859
Implantable Lead Location: 753860
Implantable Lead Model: 5076
Implantable Lead Model: 6942
Implantable Pulse Generator Implant Date: 20130722
Lead Channel Impedance Value: 456 Ohm
Lead Channel Impedance Value: 513 Ohm
Lead Channel Pacing Threshold Amplitude: 0.625 V
Lead Channel Pacing Threshold Amplitude: 0.75 V
Lead Channel Pacing Threshold Pulse Width: 0.4 ms
Lead Channel Pacing Threshold Pulse Width: 0.4 ms
Lead Channel Sensing Intrinsic Amplitude: 2.125 mV
Lead Channel Sensing Intrinsic Amplitude: 2.125 mV
Lead Channel Sensing Intrinsic Amplitude: 8.5 mV
Lead Channel Sensing Intrinsic Amplitude: 8.5 mV
Lead Channel Setting Pacing Amplitude: 2 V
Lead Channel Setting Pacing Amplitude: 2.5 V
Lead Channel Setting Pacing Pulse Width: 0.4 ms
Lead Channel Setting Sensing Sensitivity: 0.45 mV

## 2019-01-06 ENCOUNTER — Telehealth: Payer: Self-pay

## 2019-01-06 NOTE — Telephone Encounter (Signed)
Virtual Visit Pre-Appointment Phone Call 1. Confirm consent - "In the setting of the current Covid19 crisis, you are scheduled for a (phone or video) visit with your provider on (date) at (time).  Just as we do with many in-office visits, in order for you to participate in this visit, we must obtain consent.  If you'd like, I can send this to your mychart (if signed up) or email for you to review.  Otherwise, I can obtain your verbal consent now.  All virtual visits are billed to your insurance company just like a normal visit would be.  By agreeing to a virtual visit, we'd like you to understand that the technology does not allow for your provider to perform an examination, and thus may limit your provider's ability to fully assess your condition. If your provider identifies any concerns that need to be evaluated in person, we will make arrangements to do so.  Finally, though the technology is pretty good, we cannot assure that it will always work on either your or our end, and in the setting of a video visit, we may have to convert it to a phone-only visit.  In either situation, we cannot ensure that we have a secure connection.  Are you willing to proceed?" YES  2. Advise patient to be prepared - "Two hours prior to your appointment, go ahead and check your blood pressure, pulse, oxygen saturation, and your weight (if you have the equipment to check those) and write them all down. When your visit starts, your provider will ask you for this information. If you have an Apple Watch or Kardia device, please plan to have heart rate information ready on the day of your appointment. Please have a pen and paper handy nearby the day of the visit as well."  3. Give patient instructions for MyChart download to smartphone OR Doximity/[email or web address].me as below if video visit (depending on what platform provider is using)  4. Inform patient they will receive a phone call 15 minutes prior to their appointment time (may be  from unknown caller ID) so they should be prepared to answer    Nicole Swanson has been deemed a candidate for a follow-up tele-health visit to limit community exposure during the Covid-19 pandemic. I spoke with the patient via phone to ensure availability of phone/video source, confirm preferred email & phone number, and discuss instructions and expectations.  I reminded Nicole Swanson to be prepared with any vital sign and/or heart rhythm information that could potentially be obtained via home monitoring, at the time of her visit. I reminded Nicole Swanson to expect a phone call prior to her visit.  [name], RMA 01/06/2019 4:08 PM   IF USING DOXIMITY or DOXY.ME - The patient will receive a link just prior to their visit by text.     FULL LENGTH CONSENT FOR TELE-HEALTH VISIT   I hereby voluntarily request, consent and authorize [facility] and its employed or contracted physicians, physician assistants, nurse practitioners or other licensed health care professionals (the Practitioner), to provide me with telemedicine health care services (the "Services") as deemed necessary by the treating Practitioner. I acknowledge and consent to receive the Services by the Practitioner via telemedicine. I understand that the telemedicine visit will involve communicating with the Practitioner through live audiovisual communication technology and the disclosure of certain medical information by electronic transmission. I acknowledge that I have been given the opportunity to request an in-person  assessment or other available alternative prior to the telemedicine visit and am voluntarily participating in the telemedicine visit.  I understand that I have the right to withhold or withdraw my consent to the use of telemedicine in the course of my care at any time, without affecting my right to future care or treatment, and that the Practitioner or I may terminate the telemedicine visit  at any time. I understand that I have the right to inspect all information obtained and/or recorded in the course of the telemedicine visit and may receive copies of available information for a reasonable fee.  I understand that some of the potential risks of receiving the Services via telemedicine include:  Marland Kitchen Delay or interruption in medical evaluation due to technological equipment failure or disruption; . Information transmitted may not be sufficient (e.g. poor resolution of images) to allow for appropriate medical decision making by the Practitioner; and/or  . In rare instances, security protocols could fail, causing a breach of personal health information.  Furthermore, I acknowledge that it is my responsibility to provide information about my medical history, conditions and care that is complete and accurate to the best of my ability. I acknowledge that Practitioner's advice, recommendations, and/or decision may be based on factors not within their control, such as incomplete or inaccurate data provided by me or distortions of diagnostic images or specimens that may result from electronic transmissions. I understand that the practice of medicine is not an exact science and that Practitioner makes no warranties or guarantees regarding treatment outcomes. I acknowledge that I will receive a copy of this consent concurrently upon execution via email to the email address I last provided but may also request a printed copy by calling the office of [facility].    I understand that my insurance will be billed for this visit.   I have read or had this consent read to me. . I understand the contents of this consent, which adequately explains the benefits and risks of the Services being provided via telemedicine.  . I have been provided ample opportunity to ask questions regarding this consent and the Services and have had my questions answered to my satisfaction. . I give my informed consent for the  services to be provided through the use of telemedicine in my medical care  By participating in this telemedicine visit I agree to the above.

## 2019-01-08 ENCOUNTER — Other Ambulatory Visit: Payer: Self-pay

## 2019-01-08 NOTE — Progress Notes (Signed)
Remote ICD transmission.   

## 2019-01-20 ENCOUNTER — Telehealth: Payer: Self-pay | Admitting: Internal Medicine

## 2019-01-20 ENCOUNTER — Telehealth (INDEPENDENT_AMBULATORY_CARE_PROVIDER_SITE_OTHER): Payer: Medicare HMO | Admitting: Internal Medicine

## 2019-01-20 ENCOUNTER — Telehealth: Payer: Self-pay | Admitting: *Deleted

## 2019-01-20 ENCOUNTER — Other Ambulatory Visit: Payer: Self-pay

## 2019-01-20 VITALS — BP 110/72 | HR 74 | Temp 95.5°F | Ht 67.0 in | Wt 147.0 lb

## 2019-01-20 DIAGNOSIS — Z79899 Other long term (current) drug therapy: Secondary | ICD-10-CM

## 2019-01-20 DIAGNOSIS — R0602 Shortness of breath: Secondary | ICD-10-CM

## 2019-01-20 DIAGNOSIS — I472 Ventricular tachycardia, unspecified: Secondary | ICD-10-CM

## 2019-01-20 DIAGNOSIS — I469 Cardiac arrest, cause unspecified: Secondary | ICD-10-CM | POA: Diagnosis not present

## 2019-01-20 DIAGNOSIS — Z95810 Presence of automatic (implantable) cardiac defibrillator: Secondary | ICD-10-CM

## 2019-01-20 DIAGNOSIS — R0609 Other forms of dyspnea: Secondary | ICD-10-CM

## 2019-01-20 DIAGNOSIS — M79602 Pain in left arm: Secondary | ICD-10-CM
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
## 2019-01-20 NOTE — Telephone Encounter (Signed)
Please call patient and schedule a virtual visit ASAP see Dr Olin Pia notes ) for  COPD and arm pain ).  If I do not have an appt this week seei if Joycelyn Schmid can see her

## 2019-01-20 NOTE — Telephone Encounter (Signed)
xrays have been ordered

## 2019-01-20 NOTE — Telephone Encounter (Signed)
Spoke with pt and she stated that she is going to come in tomorrow for the xrays and then have a virtual visit with you on Friday at 2:30pm.

## 2019-01-20 NOTE — Patient Instructions (Signed)
Medication Instructions:  - Your physician recommends that you continue on your current medications as directed. Please refer to the Current Medication list given to you today.  If you need a refill on your cardiac medications before your next appointment, please call your pharmacy.   Lab work: - none ordered  If you have labs (blood work) drawn today and your tests are completely normal, you will receive your results only by: Marland Kitchen MyChart Message (if you have MyChart) OR . A paper copy in the mail If you have any lab test that is abnormal or we need to change your treatment, we will call you to review the results.  Testing/Procedures: Your physician has requested that you have an echocardiogram (ASAP) Echocardiography is a painless test that uses sound waves to create images of your heart. It provides your doctor with information about the size and shape of your heart and how well your heart's chambers and valves are working. This procedure takes approximately one hour. There are no restrictions for this procedure.  Follow-Up: At [facility], you and your health needs are our priority.  As part of our continuing mission to provide you with exceptional heart care, we have created designated Provider Care Teams.  These Care Teams include your primary Cardiologist (physician) and Advanced Practice Providers (APPs -  Physician Assistants and Nurse Practitioners) who all work together to provide you with the care you need, when you need it. . in 4 weeks with Dr. [name]  . Please send a remote transmission today  Any Other Special Instructions Will Be Listed Below (If Applicable). - N/A   Echocardiogram An echocardiogram is a procedure that uses painless sound waves (ultrasound) to produce an image of the heart. Images from an echocardiogram can provide important information about:  Signs of coronary artery disease (CAD).  Aneurysm detection. An aneurysm is a weak or damaged part of an artery  wall that bulges out from the normal force of blood pumping through the body.  Heart size and shape. Changes in the size or shape of the heart can be associated with certain conditions, including heart failure, aneurysm, and CAD.  Heart muscle function.  Heart valve function.  Signs of a past heart attack.  Fluid buildup around the heart.  Thickening of the heart muscle.  A tumor or infectious growth around the heart valves. Tell a health care provider about:  Any allergies you have.  All medicines you are taking, including vitamins, herbs, eye drops, creams, and over-the-counter medicines.  Any blood disorders you have.  Any surgeries you have had.  Any medical conditions you have.  Whether you are pregnant or may be pregnant. What are the risks? Generally, this is a safe procedure. However, problems may occur, including:  Allergic reaction to dye (contrast) that may be used during the procedure. What happens before the procedure? No specific preparation is needed. You may eat and drink normally. What happens during the procedure?   An IV tube may be inserted into one of your veins.  You may receive contrast through this tube. A contrast is an injection that improves the quality of the pictures from your heart.  A gel will be applied to your chest.  A wand-like tool (transducer) will be moved over your chest. The gel will help to transmit the sound waves from the transducer.  The sound waves will harmlessly bounce off of your heart to allow the heart images to be captured in real-time motion. The images will  be recorded on a computer. The procedure may vary among health care providers and hospitals. What happens after the procedure?  You may return to your normal, everyday life, including diet, activities, and medicines, unless your health care provider tells you not to do that. Summary  An echocardiogram is a procedure that uses painless sound waves (ultrasound)  to produce an image of the heart.  Images from an echocardiogram can provide important information about the size and shape of your heart, heart muscle function, heart valve function, and fluid buildup around your heart.  You do not need to do anything to prepare before this procedure. You may eat and drink normally.  After the echocardiogram is completed, you may return to your normal, everyday life, unless your health care provider tells you not to do that. This information is not intended to replace advice given to you by your health care provider. Make sure you discuss any questions you have with your health care provider. Document Released: 08/17/2000 Document Revised: 09/22/2016 Document Reviewed: 09/22/2016 Elsevier Interactive Patient Education  2019 [employer].

## 2019-01-20 NOTE — Telephone Encounter (Signed)
-----   Message from Deboraha Sprang, MD sent at 01/20/2019  4:56 PM EDT ----- H  could you do me a favor plz  Her optivol remains elevated since march-- could we give her lasix 20 mg daily x 5 days  [employer]

## 2019-01-20 NOTE — Telephone Encounter (Signed)
thnx

## 2019-01-21 ENCOUNTER — Ambulatory Visit (INDEPENDENT_AMBULATORY_CARE_PROVIDER_SITE_OTHER): Payer: Medicare HMO

## 2019-01-21 ENCOUNTER — Other Ambulatory Visit: Payer: Self-pay

## 2019-01-21 DIAGNOSIS — M5030 Other cervical disc degeneration, unspecified cervical region: Secondary | ICD-10-CM | POA: Diagnosis not present

## 2019-01-21 DIAGNOSIS — R0602 Shortness of breath: Secondary | ICD-10-CM | POA: Diagnosis not present

## 2019-01-21 DIAGNOSIS — M79602 Pain in left arm: Secondary | ICD-10-CM

## 2019-01-21 MED ORDER — FUROSEMIDE 20 MG PO TABS: ORAL_TABLET | ORAL | 0 refills | Status: DC

## 2019-01-21 NOTE — Telephone Encounter (Signed)
I spoke with the patient.  She is aware of Dr. Olin Pia recommendations to start lasix 20 mg- take 1 tablet daily x 5 days.  She voices understanding of the above and is agreeable.  She tells me she is scheduled for a chest x-ray today, her echo is 5/21, & Dr. Derrel Nip on 5/22.

## 2019-01-21 NOTE — Telephone Encounter (Signed)
Opened in error

## 2019-01-22 ENCOUNTER — Other Ambulatory Visit: Payer: Self-pay

## 2019-01-22 ENCOUNTER — Ambulatory Visit (INDEPENDENT_AMBULATORY_CARE_PROVIDER_SITE_OTHER): Payer: Medicare HMO

## 2019-01-22 DIAGNOSIS — R0609 Other forms of dyspnea: Secondary | ICD-10-CM | POA: Diagnosis not present

## 2019-01-23 ENCOUNTER — Ambulatory Visit (INDEPENDENT_AMBULATORY_CARE_PROVIDER_SITE_OTHER): Payer: Medicare HMO | Admitting: Internal Medicine

## 2019-01-23 ENCOUNTER — Encounter: Payer: Self-pay | Admitting: Internal Medicine

## 2019-01-23 DIAGNOSIS — M792 Neuralgia and neuritis, unspecified: Secondary | ICD-10-CM | POA: Diagnosis not present

## 2019-01-23 DIAGNOSIS — J4530 Mild persistent asthma, uncomplicated: Secondary | ICD-10-CM

## 2019-01-23 DIAGNOSIS — M5412 Radiculopathy, cervical region: Secondary | ICD-10-CM | POA: Diagnosis not present

## 2019-01-23 MED ORDER — PREDNISONE 10 MG PO TABS: ORAL_TABLET | ORAL | 0 refills | Status: DC
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
## 2019-01-25 DIAGNOSIS — J45909 Unspecified asthma, uncomplicated: Secondary | ICD-10-CM | POA: Insufficient documentation

## 2019-01-25 DIAGNOSIS — M5000 Cervical disc disorder with myelopathy, unspecified cervical region: Secondary | ICD-10-CM | POA: Insufficient documentation

## 2019-01-25 NOTE — Assessment & Plan Note (Signed)
Continue prn albuterol.  Not wheezing currently.  Adding prednisone taper given concurrent radiculopathy

## 2019-01-25 NOTE — Assessment & Plan Note (Signed)
Suggested by symptoms of left arm pain and degenerative changes seen on C spine films.  CT spine ordered.  Prednisone  taper given , as she prefers to avoid narcotics

## 2019-02-03 ENCOUNTER — Telehealth: Payer: Self-pay | Admitting: Internal Medicine

## 2019-02-03 NOTE — Telephone Encounter (Signed)
I spoke with the patient regarding her echo results. I inquired as to how she was feeling after her 5 day course of lasix. She states she is feeling some better, but "not where I want to be." She states Dr. Derrel Nip also put her on a short course of a prednisone taper that she finished 2 days ago. She thinks this helped as well, but then she had a day after she stopped it she didn't feel as well. Now she is feeling some better.  She states her urine output on the lasix was not what she anticipated it to be although a little bit of an increase.  I advised her either she doesn't have that much fluid to get rid of or she may need a little higher dose on her diuretic. She is aware I will review with Dr. [name] and touch base with her with any further recommendations.  She voices understanding and is agreeable.

## 2019-02-03 NOTE — Telephone Encounter (Signed)
Notes recorded by Deboraha Sprang, MD on 01/29/2019 at 9:47 PM EDT Please Inform Patient Nicole Swanson showed Normal* systolic dysfunction but some stiffness How is she feeling after the diuretics  Nicole Swanson Thanks SK

## 2019-02-04 NOTE — Telephone Encounter (Signed)
Nicole Swanson  We should do some blood work  Something doesn't seem right   Lets do CBC CMET and TSH    thnks  [name]

## 2019-02-05 NOTE — Telephone Encounter (Signed)
I spoke with the patient.  I have advised her of Dr. Olin Pia recommendations.   She states she is feeling some better still and would like to postpone her lab work until her follow up scheduled with Dr. [name] on 02/24/19.   I advised that I will note this in her chart.  She is due to see Dr. [name] on 02/12/19.

## 2019-02-11 DIAGNOSIS — M5412 Radiculopathy, cervical region: Secondary | ICD-10-CM

## 2019-02-12 DIAGNOSIS — I469 Cardiac arrest, cause unspecified: Secondary | ICD-10-CM | POA: Diagnosis not present

## 2019-02-12 DIAGNOSIS — I509 Heart failure, unspecified: Secondary | ICD-10-CM | POA: Diagnosis not present

## 2019-02-12 DIAGNOSIS — R0602 Shortness of breath: Secondary | ICD-10-CM | POA: Diagnosis not present

## 2019-02-12 DIAGNOSIS — J449 Chronic obstructive pulmonary disease, unspecified: Secondary | ICD-10-CM | POA: Diagnosis not present

## 2019-02-13 MED ORDER — ETODOLAC 500 MG PO TABS: 500.0000 mg | ORAL_TABLET | Freq: Two times a day (BID) | ORAL | 2 refills | Status: DC

## 2019-02-24 ENCOUNTER — Encounter: Payer: Self-pay | Admitting: Internal Medicine

## 2019-02-24 ENCOUNTER — Ambulatory Visit (INDEPENDENT_AMBULATORY_CARE_PROVIDER_SITE_OTHER): Payer: Medicare HMO | Admitting: Internal Medicine

## 2019-02-24 ENCOUNTER — Other Ambulatory Visit: Payer: Self-pay

## 2019-02-24 VITALS — BP 128/60 | HR 63 | Ht 67.0 in | Wt 151.0 lb

## 2019-02-24 DIAGNOSIS — Z95810 Presence of automatic (implantable) cardiac defibrillator: Secondary | ICD-10-CM

## 2019-02-24 DIAGNOSIS — I493 Ventricular premature depolarization: Secondary | ICD-10-CM | POA: Diagnosis not present

## 2019-02-24 DIAGNOSIS — I5030 Unspecified diastolic (congestive) heart failure: Secondary | ICD-10-CM | POA: Diagnosis not present

## 2019-02-24 DIAGNOSIS — I469 Cardiac arrest, cause unspecified: Secondary | ICD-10-CM | POA: Diagnosis not present

## 2019-02-24 DIAGNOSIS — R0602 Shortness of breath: Secondary | ICD-10-CM

## 2019-02-24 DIAGNOSIS — Z79899 Other long term (current) drug therapy: Secondary | ICD-10-CM | POA: Insufficient documentation

## 2019-02-24 MED ORDER — FUROSEMIDE 20 MG PO TABS: ORAL_TABLET | ORAL | 6 refills | Status: DC

## 2019-02-24 NOTE — Progress Notes (Signed)
Patient Care Team: [name], MD as PCP - General (Internal Medicine)   HPI  Nicole Swanson is a 63 y.o. female Seen in followup for ICD implanted for aborted cardiac arrest. Her care initially had been at [facility] She is status post dual chamber ICD implantation and generator replacement was recently done by me summer 2013   Echocardiogram 2011 demonstrated normal left ventricular function valve function and dimension sizes   She also has a history of ventricular ectopy for which she has been on sotalol in which Dr. Sharon Seller was going to increase prior to her transferring her care to [facility]   Her husband died 60 .     When returned from [place], progressive issues with DOE, compromising ADLs; hx of asthma.  Optivol was abnormal  Diuretic trial assoc with marked improvement in optivol  Saw Pulm "modCOPD " started anoro and prn albuterol Now much better but not back to normal  BNP 50  Diet salt full ( uses soy and teriyaki sauces)  Date Cr Mg K  4/15  0.9 2.4    10/15  0.9   4.5  11/17 0.7  3.9  2/18 0.64  4.0  5/18 0.79 2.5 4.5  11/18 0.92  4.3  6/19 0.87 2.5 4.1        Device History: MDT dual chamber ICD implanted 1997 for aborted cardiac arrest; gen change 2007; gen change 2013 History of appropriate therapy: Yes History of AAD therapy: Yes - sotalol  DATE TEST EF   10/17    Myoview  65 % No ischemia  10/17 Echo  65%   5/20 Echo  55-60% E/E' <10     Past Medical History:  Diagnosis Date  . Aborted cardiac arrest 1994  . AICD (automatic cardioverter/defibrillator) present   . Allergy   . Anginal pain ([place])   . Arthritis   . Asthma    hx of asthma related to air bag release   . Dual implantable cardiac defibrillator    Medtronic initially implanted in 1997  . DVT (deep vein thrombosis) in pregnancy 1986   had heparin shots followed by coumadin  . Fall    10/11/17 walking dogs   . Family history of seizures    Question neurological versus  cardiac  . Heart murmur   . Irritable bowel syndrome   . 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 PONV (postoperative nausea and vomiting)   . Presence of permanent cardiac pacemaker   . PVC's (premature ventricular contractions)   . Raynaud's syndrome   . Shortness of breath dyspnea    with exertion   . Ventricular tachycardia [facility])     Past Surgical History:  Procedure Laterality Date  . ABDOMINAL HYSTERECTOMY    . APPENDECTOMY    . CARDIAC DEFIBRILLATOR PLACEMENT  1994   Dual chamber with pacemaker  . CARPAL TUNNEL RELEASE Right   . COLONOSCOPY WITH PROPOFOL N/A 01/18/2017   Procedure: COLONOSCOPY WITH PROPOFOL;  Surgeon: Manya Silvas, MD;  Location: [facility] ENDOSCOPY;  Service: Endoscopy;  Laterality: N/A;  . CYSTOCELE REPAIR N/A 06/19/2016   Procedure: ANTERIOR REPAIR (CYSTOCELE) WITH VAULT PROLAPSE;  Surgeon: Bjorn Loser, MD;  Location: [facility] ORS;  Service: Urology;  Laterality: N/A;  . CYSTOSCOPY N/A 06/19/2016   Procedure: CYSTOSCOPY;  Surgeon: Bjorn Loser, MD;  Location: [facility] ORS;  Service: Urology;  Laterality: N/A;  . ESOPHAGOGASTRODUODENOSCOPY (EGD) WITH PROPOFOL N/A 01/18/2017   Procedure: ESOPHAGOGASTRODUODENOSCOPY (EGD)  WITH PROPOFOL;  Surgeon: Manya Silvas, MD;  Location: [facility] ENDOSCOPY;  Service: Endoscopy;  Laterality: N/A;  . LAPAROSCOPIC LYSIS OF ADHESIONS    . NASAL SEPTUM SURGERY  [address]   . OOPHORECTOMY    . PACEMAKER INSERTION     5 different devices    Current Outpatient Medications  Medication Sig Dispense Refill  . cholecalciferol (VITAMIN D) 1000 units tablet Take 1,000 Units by mouth daily.    . Cranberry 1000 MG CAPS Take by mouth.    . dicyclomine (BENTYL) 20 MG tablet Take 1 tablet (20 mg total) by mouth 3 (three) times daily as needed for spasms. 30 tablet 1  . docusate sodium (COLACE) 250 MG capsule Take 250 mg by mouth 2 (two) times daily.    Marland Kitchen etodolac (LODINE) 500 MG tablet Take 1 tablet (500 mg  total) by mouth 2 (two) times daily. 60 tablet 2  . famotidine (PEPCID) 20 MG tablet Take 1 tablet (20 mg total) by mouth 2 (two) times daily. 60 tablet 2  . fluticasone (FLONASE) 50 MCG/ACT nasal spray Place 1 spray into both nostrils daily. 16 g 5  . furosemide (LASIX) 20 MG tablet Take 1 tablet ( 20 mg) by mouth once daily x 5 days, then stop 5 tablet 0  . loratadine (CLARITIN) 10 MG tablet Take 10 mg by mouth daily.    . montelukast (SINGULAIR) 10 MG tablet Take 1 tablet (10 mg total) by mouth at bedtime. 90 tablet 1  . omeprazole (PRILOSEC) 40 MG capsule Take 1 capsule (40 mg total) by mouth 2 (two) times daily. 180 capsule 1  . ondansetron (ZOFRAN) 4 MG tablet Take 1 tablet (4 mg total) by mouth every 8 (eight) hours as needed for nausea or vomiting. 30 tablet 0  . predniSONE (DELTASONE) 10 MG tablet 6 tablets on Day 1 , then reduce by 1 tablet daily until gone 21 tablet 0  . senna (SENOKOT) 8.6 MG tablet Take 1 tablet by mouth as needed for constipation.    . sotalol (BETAPACE) 120 MG tablet Take 1 tablet (120 mg total) by mouth 2 (two) times daily. 180 tablet 1  . VENTOLIN HFA 108 (90 Base) MCG/ACT inhaler      No current facility-administered medications for this visit.     Allergies  Allergen Reactions  . Vancomycin Rash    Other Reaction: RASH & WELTS  . Penicillins Rash    Has patient had a PCN reaction causing immediate rash, facial/tongue/throat swelling, SOB or lightheadedness with hypotension: No Has patient had a PCN reaction causing severe rash involving mucus membranes or skin necrosis: No Has patient had a PCN reaction that required hospitalization: No Has patient had a PCN reaction occurring within the last 10 years: No If all of the above answers are "NO", then may proceed with Cephalosporin use.   . Sulfa Antibiotics Diarrhea    Review of Systems negative except from HPI and PMH BP 128/60 (BP Location: Left Arm, Patient Position: Sitting, Cuff Size: Normal)    Pulse 63   Ht 5\' 7"  (1.702 m)   Wt 151 lb (68.5 kg)   BMI 23.65 kg/m   Physical Exam Well developed and well nourished in no acute distress HENT normal Neck supple   Clear but with IE 71f 1;1 with mild forced exp wheezes Device pocket well healed; without hematoma or erythema.  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  The information was reviewed. No changes were made in the programming.     Abnormal ECG  Asthma  HFpEF   No intercurrent Ventricular tachycardia  Euvolemic but Optivol which improved following 5 d of furosemide is now increasing again  Will begin 20 furosemide 2-3 week and enroll in [facility] clinic  Discussed decrease sodium intake  Check CBC and TSH and sotalol labs  We spent more than 50% of our >25 min visit in face to face counseling regarding the above  DEvice interrogation  Normal function without arrhythmia

## 2019-02-24 NOTE — Patient Instructions (Signed)
Medication Instructions:  - Your physician has recommended you make the following change in your medication:   1) START lasix (furosemide) 20 mg- take 1 tablet (20 mg) by mouth 2-3 times/ week as needed for increased shortness of breath/ swelling  If you need a refill on your cardiac medications before your next appointment, please call your pharmacy.   Lab work: - Your physician recommends that you have lab work today: CMET/ TSH/ CBC/ Magnesium  If you have labs (blood work) drawn today and your tests are completely normal, you will receive your results only by: Marland Kitchen MyChart Message (if you have MyChart) OR . A paper copy in the mail If you have any lab test that is abnormal or we need to change your treatment, we will call you to review the results.  Testing/Procedures: - none ordered  Follow-Up: At [facility], you and your health needs are our priority.  As part of our continuing mission to provide you with exceptional heart care, we have created designated Provider Care Teams.  These Care Teams include your primary Cardiologist (physician) and Advanced Practice Providers (APPs -  Physician Assistants and Nurse Practitioners) who all work together to provide you with the care you need, when you need it.  You will need a follow up appointment in 6 months (December) with Dr. [name]. Marland Kitchen Please call our office 2 months in advance to schedule this appointment.  (call in early October to schedule)  Remote monitoring is used to monitor your Pacemaker of ICD from home. This monitoring reduces the number of office visits required to check your device to one time per year. It allows [place] to keep an eye on the functioning of your device to ensure it is working properly. You are scheduled for a device check from home on 03/31/2019. You may send your transmission at any time that day. If you have a wireless device, the transmission will be sent automatically. After your physician reviews your transmission,  you will receive a postcard with your next transmission date.  We will set you up with Sharman Cheek, RN for ICM (implantable cardiac monitoring) clinic- she will be in touch with you about setting you up for monthly follow up of your fluid readings once a month   Any Other Special Instructions Will Be Listed Below (If Applicable). -N/A

## 2019-02-25 ENCOUNTER — Telehealth: Payer: Self-pay

## 2019-02-25 LAB — CBC WITH DIFFERENTIAL/PLATELET
Basophils Absolute: 0 x10E3/uL (ref 0.0–0.2)
Basos: 1 %
EOS (ABSOLUTE): 0.3 x10E3/uL (ref 0.0–0.4)
Eos: 7 %
Hematocrit: 42.7 % (ref 34.0–46.6)
Hemoglobin: 14.2 g/dL (ref 11.1–15.9)
Immature Grans (Abs): 0 x10E3/uL (ref 0.0–0.1)
Immature Granulocytes: 1 %
Lymphocytes Absolute: 1.1 x10E3/uL (ref 0.7–3.1)
Lymphs: 26 %
MCH: 30.9 pg (ref 26.6–33.0)
MCHC: 33.3 g/dL (ref 31.5–35.7)
MCV: 93 fL (ref 79–97)
Monocytes Absolute: 0.4 x10E3/uL (ref 0.1–0.9)
Monocytes: 8 %
Neutrophils Absolute: 2.5 x10E3/uL (ref 1.4–7.0)
Neutrophils: 57 %
Platelets: 169 x10E3/uL (ref 150–450)
RBC: 4.6 x10E6/uL (ref 3.77–5.28)
RDW: 12.8 % (ref 11.7–15.4)
WBC: 4.4 x10E3/uL (ref 3.4–10.8)

## 2019-02-25 LAB — COMPREHENSIVE METABOLIC PANEL
ALT: 10 IU/L (ref 0–32)
AST: 17 IU/L (ref 0–40)
Albumin/Globulin Ratio: 1.8 (ref 1.2–2.2)
Albumin: 4.5 g/dL (ref 3.8–4.8)
Alkaline Phosphatase: 96 IU/L (ref 39–117)
BUN/Creatinine Ratio: 19 (ref 12–28)
BUN: 16 mg/dL (ref 8–27)
Bilirubin Total: 0.4 mg/dL (ref 0.0–1.2)
CO2: 21 mmol/L (ref 20–29)
Calcium: 9.6 mg/dL (ref 8.7–10.3)
Chloride: 102 mmol/L (ref 96–106)
Creatinine, Ser: 0.85 mg/dL (ref 0.57–1.00)
GFR calc Af Amer: 83 mL/min/{1.73_m2} (ref >59)
GFR calc non Af Amer: 72 mL/min/{1.73_m2} (ref >59)
Globulin, Total: 2.5 g/dL (ref 1.5–4.5)
Glucose: 113 mg/dL — ABNORMAL HIGH (ref 65–99)
Potassium: 4.5 mmol/L (ref 3.5–5.2)
Sodium: 141 mmol/L (ref 134–144)
Total Protein: 7 g/dL (ref 6.0–8.5)

## 2019-02-25 LAB — MAGNESIUM: Magnesium: 2.2 mg/dL (ref 1.6–2.3)

## 2019-02-25 LAB — TSH: TSH: 1.98 u[IU]/mL (ref 0.450–4.500)

## 2019-02-25 NOTE — Telephone Encounter (Signed)
Referred to [facility] clinic by Dr [name].  Call to patient today and provided ICM intro.  She agreed to monthly follow up.  She is taking Furosemide 20 mg 2-3 times a week which is a new prescription for her.  Her breathing has improved but the report reviewed by Dr [name] in the office 02/24/2019 was showing signs of fluid accumulation.  Advised monitor should be by bedside in order for it to automatically transmit a report during sleep hours of 12 midnight and 6 AM.  Patient confirmed monitor is at bedside.  Explained a Remote Home Transmission will be seen as daytime appointment on office summary visit sheet but the time is not relevant since all transmissions are sent at night time so there is no obligation to stay by the monitor at that time appointed time during the day. Provided ICM number and explained should call if experiencing any fluid symptoms such as weight gain, shortness of breath or extremity/abdominal swelling. First ICM remote transmission scheduled 03/10/2019 to recheck fluid levels.

## 2019-03-04 ENCOUNTER — Ambulatory Visit: Payer: Medicare HMO | Attending: Internal Medicine | Admitting: Physical Therapy

## 2019-03-04 ENCOUNTER — Encounter: Payer: Self-pay | Admitting: Physical Therapy

## 2019-03-04 ENCOUNTER — Other Ambulatory Visit: Payer: Self-pay

## 2019-03-04 DIAGNOSIS — M542 Cervicalgia: Secondary | ICD-10-CM | POA: Insufficient documentation

## 2019-03-04 DIAGNOSIS — M5412 Radiculopathy, cervical region: Secondary | ICD-10-CM | POA: Diagnosis not present

## 2019-03-04 NOTE — Therapy (Signed)
[facility] [address], [place], [address] Phone: [phone]   Fax:  [phone]  Physical Therapy Evaluation  Patient Details  Name: Nicole Swanson MRN: [id number] Date of Birth: 1955/09/18 No data recorded  Encounter Date: 03/04/2019  PT End of Session - 03/05/19 1531    Visit Number  1    Number of Visits  17    Date for PT Re-Evaluation  04/30/19    PT Start Time  0430    PT Stop Time  0530    PT Time Calculation (min)  60 min    Activity Tolerance  Patient tolerated treatment well    Behavior During Therapy  [facility] for tasks assessed/performed       Past Medical History:  Diagnosis Date  . Aborted cardiac arrest 1994  . AICD (automatic cardioverter/defibrillator) present   . Allergy   . Anginal pain ([place])   . Arthritis   . Asthma    hx of asthma related to air bag release   . Dual implantable cardiac defibrillator    [employer] initially implanted in 1997  . DVT (deep vein thrombosis) in pregnancy 1986   had heparin shots followed by coumadin  . Fall    10/11/17 walking dogs   . Family history of seizures    Question neurological versus cardiac  . Heart murmur   . Irritable bowel syndrome   . 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 PONV (postoperative nausea and vomiting)   . Presence of permanent cardiac pacemaker   . PVC's (premature ventricular contractions)   . Raynaud's syndrome   . Shortness of breath dyspnea    with exertion   . Ventricular tachycardia [facility])     Past Surgical History:  Procedure Laterality Date  . ABDOMINAL HYSTERECTOMY    . APPENDECTOMY    . CARDIAC DEFIBRILLATOR PLACEMENT  1994   Dual chamber with pacemaker  . CARPAL TUNNEL RELEASE Right   . COLONOSCOPY WITH PROPOFOL N/A 01/18/2017   Procedure: COLONOSCOPY WITH PROPOFOL;  Surgeon: Manya Silvas, MD;  Location: [facility] ENDOSCOPY;  Service: Endoscopy;  Laterality: N/A;  . CYSTOCELE REPAIR N/A  06/19/2016   Procedure: ANTERIOR REPAIR (CYSTOCELE) WITH VAULT PROLAPSE;  Surgeon: Bjorn Loser, MD;  Location: [facility] ORS;  Service: Urology;  Laterality: N/A;  . CYSTOSCOPY N/A 06/19/2016   Procedure: CYSTOSCOPY;  Surgeon: Bjorn Loser, MD;  Location: [facility] ORS;  Service: Urology;  Laterality: N/A;  . ESOPHAGOGASTRODUODENOSCOPY (EGD) WITH PROPOFOL N/A 01/18/2017   Procedure: ESOPHAGOGASTRODUODENOSCOPY (EGD) WITH PROPOFOL;  Surgeon: Manya Silvas, MD;  Location: [facility] ENDOSCOPY;  Service: Endoscopy;  Laterality: N/A;  . LAPAROSCOPIC LYSIS OF ADHESIONS    . NASAL SEPTUM SURGERY  [address]   . OOPHORECTOMY    . PACEMAKER INSERTION     5 different devices    There were no vitals filed for this visit.   Subjective Assessment - 03/04/19 1637    Pertinent History  Pt is a 63 year old female reporting with L sided neck and UE pain since March 2020. Patient reports having a cervical Xray this year that demonstrated "bone spurs in the cervical spine". Had predinsone taper last month that "somewhat helped", and that patient wanted a CT scan, but insurance will not cover this until PT for 6wks. Reports pain relief following predisone taper that was short lived, and that continued NSAIDs help pain but she does not tolerate them  well. Pt is retired, enjoys walking, has started wt training 2x/week. Reports pain begins on L side of the neck and travels down the LUE, but reports no more numbess/tingling sensation since predinsone tapper. Worst pain over past week 2/10, 0/10. Prior to predisone taper, reports she could not complete heavier household chores.Pt denies N/V, B&B changes, unexplained weight fluctuation, saddle paresthesia, fever, night sweats, or unrelenting night pain at this time.    Limitations  Other (comment)    How long can you sit comfortably?  unlimited    How long can you stand comfortably?  ulimited    How long can you walk comfortably?  unlimited    Diagnostic tests  Xray     Patient Stated Goals  Maintain decreased pain without NSAIDs    Currently in Pain?  Yes    Pain Score  0-No pain    Pain Location  Neck    Pain Orientation  Left    Pain Descriptors / Indicators  Burning;Shooting;Heaviness    Pain Type  Chronic pain    Pain Radiating Towards  LUE    Pain Frequency  Intermittent    Aggravating Factors   laying on L side,    Pain Relieving Factors  NSAIDs- unable to tolerate            OBJECTIVE  Mental Status Patient is oriented to person, place and time.  Recent memory is intact.  Remote memory is intact.  Attention span and concentration are intact.  Expressive speech is intact.  Patient's fund of knowledge is within normal limits for educational level.  SENSATION: Grossly intact to light touch bilateral UE as determined by testing dermatomes C2-T2 Proprioception and hot/cold testing deferred on this date   MUSCULOSKELETAL: Tremor: None Bulk: Normal Tone: Normal  Posture Forward head, rounded shoulders.   Gait Gait assessment deferred on this date    Palpation TTP with withdrawal response at upper trap, and L cervical paraspinals. Verbally reports tenderness to L deltoids, pec minor and levator    Strength R/L 5/5 Shoulder flexion (anterior deltoid/pec major/coracobrachialis, axillary n. (C5/6) and musculocutaneous n. (C5-7)) 5/5 Shoulder abduction (deltoid/supraspinatus, axillary/suprascapular n, C5) 5/5 Shoulder external rotation (infraspinatus/teres minor) 5/5 Shoulder internal rotation (subcapularis/lats/pec major) 5/5 Shoulder extension (posterior deltoid, lats, teres major, axillary/thoracodorsal n.) 5/5 Shoulder horizontal abduction 4/4 Lower Trap Y 4/4 Middle Trap/rhomboids T 5/5 Elbow flexion (biceps brachii, brachialis, brachioradialis, musculoskeletal n, C5/6) 5/5 Elbow extension (triceps, radial n, C7) 5/5 Wrist Extension (C6/7) 5/5 Wrist Flexion (C6/7) 5/5 Finger adduction (interossei, ulnar n, T1) Cervical  isometrics are strong in all directions;  AROM R/L 60 Cervical Flexion 120 Cervical Extension 42/42 Cervical Lateral Flexion 60/63* Cervical Rotation WNL shoulder flex WNL shoulder abd T10/ T12* shoulder IR C7/C7 shoulder ER  *Indicates pain, overpressure performed unless otherwise indicated  PROM WNL without pain  Repeated Movements No centralization or peripheralization of symptoms with repeated cervical protraction and retraction.    Passive Accessory Intervertebral Motion (PAIVM) Pt denies reproduction of neck pain with CPA C2-T7 and UPA bilaterally C1-T7. Generally hypomobile throughout  Passive Physiological Intervertebral Motion (PPIVM) Normal flexion and extension with PPIVM testing  Reflex Testing Biceps (C5/6): Diminished bilat Triceps (C7): Diminished bilat  SPECIAL TESTS Spurlings A (ipsilateral lateral flexion/axial compression): Negative bilat Spurlings B (ipsilateral lateral flexion/contralateral rotation/axial compression): Negative bilat Distraction Test: Negative  Hoffman Sign (cervical cord compression):Negative bilat ULTT Median: Negative bilat ULTT Ulnar: Negative bilat ULTT Radial: Negative bilat  Ther-Ex UT stretch 10sec hold x3  Pec  doorway stretch 10sec hold x3  Prone row 5# x10 Prone T 5# x10  Cuing initially for scapular retraction with good carry over following Education on postural length tension relationship to decrease tension on ant and strengthening/activation of periscapular musculature for best mechanical positioning                      Objective measurements completed on examination: See above findings.              PT Education - 03/05/19 1441    Education provided  Yes    Education Details  Patient was educated on diagnosis, anatomy and pathology involved, prognosis, role of PT, and was given an HEP, demonstrating exercise with proper form following verbal and tactile cues, and was given a paper  hand out to continue exercise at home. Pt was educated on and agreed to plan of care.    Person(s) Educated  Patient    Methods  Explanation;Demonstration;Tactile cues;Verbal cues;Handout    Comprehension  Verbalized understanding;Returned demonstration;Verbal cues required;Tactile cues required       PT Short Term Goals - 03/05/19 1630      PT SHORT TERM GOAL #1   Title  Pt will be independent with HEP in order to improve strength and decrease back pain in order to improve pain-free function at home and work.    Time  4    Period  Weeks    Status  New        PT Long Term Goals - 03/05/19 1631      PT LONG TERM GOAL #1   Title  Pt will decrease worst neck pain as reported on NPRS by at least 2 points in order to demonstrate clinically significant reduction in back pain.    Baseline  03/05/19 2/10 with UE numbess/tingling    Time  8    Period  Weeks    Status  New      PT LONG TERM GOAL #2   Title  Pt will demonstrate full cervical rotation, without pain to demonstrate safety with driving    Baseline  03/05/19 R 60d L 63d with pain    Time  8    Period  Weeks    Status  New      PT LONG TERM GOAL #3   Title  Patient will increase FOTO score to 74 to demonstrate predicted increase in functional mobility to complete ADLs    Baseline  03/05/19 56    Time  8             Plan - 03/05/19 1626    Clinical Impression Statement  Pt is a pleasant 63 year-old female referred for neck pain with occasional LUE sensation deficits. PT examination reveals deficits in posture, decreased strength of periscapular musculature, sensation deficits, cervical ROM, and cervical pain. Difficulties in lifting, overhead, and driving activities; inhibiting full participation in ADLs and community activity. Pt will benefit from skilled PT services to address deficits and return to pain-free function at home and work.    Personal Factors and Comorbidities  Age;Comorbidity 1;Comorbidity 2;Comorbidity  3+;Sex;Past/Current Experience    Comorbidities  VTach, Raynaud's PVs, pacemaker    Examination-Activity Limitations  Lift;Reach Overhead;Sleep    Examination-Participation Restrictions  Community Activity;Yard Work;Driving    Stability/Clinical Decision Making  Evolving/Moderate complexity    Clinical Decision Making  Moderate    Rehab Potential  Good    PT Frequency  2x / week  PT Duration  8 weeks    PT Treatment/Interventions  ADLs/Self Care Home Management;Therapeutic exercise;Electrical Stimulation;Taping;Joint Manipulations;Spinal Manipulations;Dry needling;Manual techniques;Passive range of motion;Neuromuscular re-education;Balance training;Moist Heat;Traction;Ultrasound;Cryotherapy;Therapeutic activities;Patient/family education;Functional mobility training    PT Next Visit Plan  HEP review    PT Home Exercise Plan  UT stretch, pec doorwary stretch, Prone row, prone T    Consulted and Agree with Plan of Care  Patient       Patient will benefit from skilled therapeutic intervention in order to improve the following deficits and impairments:  Increased fascial restricitons, Impaired sensation, Improper body mechanics, Pain, Postural dysfunction, Decreased mobility, Decreased activity tolerance, Decreased endurance, Decreased range of motion, Decreased strength, Impaired UE functional use, Impaired flexibility  Visit Diagnosis: 1. Radiculopathy, cervical region   2. Cervicalgia        Problem List Patient Active Problem List   Diagnosis Date Noted  . High risk medication use 02/24/2019  . Asthma 01/25/2019  . Cervical radiculopathy at C6 01/25/2019  . Chronic sinusitis of both maxillary sinuses 06/03/2018  . Encounter for preventive health examination 02/09/2018  . Eustachian tube disorder, left 07/08/2017  . Cystocele, midline 06/19/2016  . Cardiac arrest ([place]) 05/14/2016  . Ventricular premature depolarization 05/14/2016  . Non-dose-related adverse reaction to  medication 03/13/2016  . Urinary frequency 09/16/2015  . Bladder prolapse, female, acquired 09/08/2015  . Other intervertebral disc degeneration, lumbar region 01/25/2015  . Sacroiliac joint dysfunction of both sides 01/25/2015  . Spinal stenosis, lumbar region, with neurogenic claudication 01/25/2015  . Facet syndrome, lumbar 01/25/2015  . Lumbar radiculopathy 01/25/2015  . Calculus of gallbladder without cholecystitis without obstruction 12/23/2014  . Polyarthritis 03/16/2014  . Thrombocytopenia ([place]) 09/11/2013  . Other malaise 09/10/2013  . Major depressive disorder, single episode 09/10/2013  . Raynaud phenomenon 04/17/2013  . Raynaud's syndrome without gangrene 04/17/2013  . Low back pain 12/05/2012  . Shortness of breath 06/19/2012  . Aborted cardiac arrest   . ICD (implantable cardioverter-defibrillator) in place   . PVC's / ventricular tachycardia   . 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 Irritable bowel syndrome with constipation    Shelton Silvas PT, DPT Shelton Silvas 03/05/2019, 4:49 PM  [facility] [address], [place], [address] Phone: [phone]   Fax:  [phone]  Name: Nicole Swanson MRN: [id number] Date of Birth: 1956/08/20

## 2019-03-09 ENCOUNTER — Ambulatory Visit: Payer: Medicare HMO | Admitting: Physical Therapy

## 2019-03-10 ENCOUNTER — Ambulatory Visit (INDEPENDENT_AMBULATORY_CARE_PROVIDER_SITE_OTHER): Payer: Medicare HMO

## 2019-03-10 DIAGNOSIS — Z95810 Presence of automatic (implantable) cardiac defibrillator: Secondary | ICD-10-CM

## 2019-03-10 DIAGNOSIS — I472 Ventricular tachycardia, unspecified: Secondary | ICD-10-CM

## 2019-03-11 ENCOUNTER — Ambulatory Visit: Payer: Medicare HMO | Admitting: Physical Therapy

## 2019-03-11 ENCOUNTER — Encounter: Payer: Self-pay | Admitting: Physical Therapy

## 2019-03-11 ENCOUNTER — Other Ambulatory Visit: Payer: Self-pay

## 2019-03-11 DIAGNOSIS — M5412 Radiculopathy, cervical region: Secondary | ICD-10-CM | POA: Diagnosis not present

## 2019-03-11 DIAGNOSIS — M542 Cervicalgia: Secondary | ICD-10-CM | POA: Diagnosis not present

## 2019-03-11 NOTE — Progress Notes (Signed)
EPIC Encounter for ICM Monitoring  Patient Name: Nicole Swanson is a 63 y.o. female Date: 03/11/2019 Primary Care Physican: [name], MD Primary Cardiologist: [name] Electrophysiologist: [name] 03/11/2019 Weight: 150.5 - 151 lbs (weights a couple times a week)       1st ICM Remote transmission. Heart Failure questions reviewed.  Pt asymptomatic now.  She did feel heaviness and bloating with decreased impedance.   Optivol thoracic impedance returned normal since 02/24/2019 office visit with Dr [name].   Prescribed: Furosemide 20 mg Take 1 tablet (20 mg) by mouth 2-3 times per week as needed for increased shortness of breath/swelling.  She has been taking Furosemide 2-3 times a week.  Labs: 02/24/2019 Creatinine 0.85, BUN 16, Potassium 4.5, Sodium 141, GFR 72-83 A complete set of results can be found in Results Review.  Recommendations: Discussed limiting fluid intake to 64 oz and salt intake to < 2000 mg daily and fluid intake to 64 oz daily.  She will start monitoring fluid and salt intake and adjust to those limitations if needed.   Encouraged to call if experiencing fluid symptoms.  Follow-up plan: ICM clinic phone appointment on 04/13/2019.       Copy of ICM check sent to Dr. [name].   3 month ICM trend: 03/10/2019    1 Year ICM trend:       [name], RN 03/11/2019 1:06 PM

## 2019-03-11 NOTE — Therapy (Signed)
[facility] [address] [place], [address] Phone: [phone]   Fax:  [phone]  Physical Therapy Treatment  Patient Details  Name: Nicole Swanson MRN: [id number] Date of Birth: 15-May-1956 No data recorded  Encounter Date: 03/11/2019  PT End of Session - 03/11/19 1615    Visit Number  2    Number of Visits  17    Date for PT Re-Evaluation  04/30/19    PT Start Time  0315    PT Stop Time  0400    PT Time Calculation (min)  45 min    Activity Tolerance  Patient tolerated treatment well    Behavior During Therapy  [facility] for tasks assessed/performed       Past Medical History:  Diagnosis Date  . Aborted cardiac arrest 1994  . AICD (automatic cardioverter/defibrillator) present   . Allergy   . Anginal pain ([place])   . Arthritis   . Asthma    hx of asthma related to air bag release   . Dual implantable cardiac defibrillator    Medtronic initially implanted in 1997  . DVT (deep vein thrombosis) in pregnancy 1986   had heparin shots followed by coumadin  . Fall    10/11/17 walking dogs   . Family history of seizures    Question neurological versus cardiac  . Heart murmur   . Irritable bowel syndrome   . 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 PONV (postoperative nausea and vomiting)   . Presence of permanent cardiac pacemaker   . PVC's (premature ventricular contractions)   . Raynaud's syndrome   . Shortness of breath dyspnea    with exertion   . Ventricular tachycardia [facility])     Past Surgical History:  Procedure Laterality Date  . ABDOMINAL HYSTERECTOMY    . APPENDECTOMY    . CARDIAC DEFIBRILLATOR PLACEMENT  1994   Dual chamber with pacemaker  . CARPAL TUNNEL RELEASE Right   . COLONOSCOPY WITH PROPOFOL N/A 01/18/2017   Procedure: COLONOSCOPY WITH PROPOFOL;  Surgeon: Manya Silvas, MD;  Location: [facility] ENDOSCOPY;  Service: Endoscopy;  Laterality: N/A;  . CYSTOCELE REPAIR N/A  06/19/2016   Procedure: ANTERIOR REPAIR (CYSTOCELE) WITH VAULT PROLAPSE;  Surgeon: Bjorn Loser, MD;  Location: [facility] ORS;  Service: Urology;  Laterality: N/A;  . CYSTOSCOPY N/A 06/19/2016   Procedure: CYSTOSCOPY;  Surgeon: Bjorn Loser, MD;  Location: [facility] ORS;  Service: Urology;  Laterality: N/A;  . ESOPHAGOGASTRODUODENOSCOPY (EGD) WITH PROPOFOL N/A 01/18/2017   Procedure: ESOPHAGOGASTRODUODENOSCOPY (EGD) WITH PROPOFOL;  Surgeon: Manya Silvas, MD;  Location: [facility] ENDOSCOPY;  Service: Endoscopy;  Laterality: N/A;  . LAPAROSCOPIC LYSIS OF ADHESIONS    . NASAL SEPTUM SURGERY  [address]   . OOPHORECTOMY    . PACEMAKER INSERTION     5 different devices    There were no vitals filed for this visit.  Subjective Assessment - 03/11/19 1519    Subjective  Patient reports that her pain is back. Reports pain in the L side of the neck with numbess/tingling down the LUE into the forearm. Reports she has been unable to complete some HEP exercises d/t pain. 6/10 pain today.    Pertinent History  Pt is a 63 year old female reporting with L sided neck and UE pain since March 2020. Patient reports having a cervical Xray this year that demonstrated "bone spurs in the cervical spine". Had predinsone taper last  month that "somewhat helped", and that patient wanted a CT scan, but insurance will not cover this until PT for 6wks. Reports pain relief following predisone taper that was short lived, and that continued NSAIDs help pain but she does not tolerate them well. Pt is retired, enjoys walking, has started wt training 2x/week. Reports pain begins on L side of the neck and travels down the LUE, but reports no more numbess/tingling sensation since predinsone tapper. Worst pain over past week 2/10, 0/10. Prior to predisone taper, reports she could not complete heavier household chores.Pt denies N/V, B&B changes, unexplained weight fluctuation, saddle paresthesia, fever, night sweats, or unrelenting  night pain at this time.    Limitations  Other (comment)    How long can you sit comfortably?  unlimited    How long can you stand comfortably?  ulimited    How long can you walk comfortably?  unlimited    Diagnostic tests  Xray    Patient Stated Goals  Maintain decreased pain without NSAIDs       Manual STM with trigger point release to L UT, levator and cervical paraspinals. Following: Dry Needling: (2/1) 51mm .30 needles placed along the L levator and L UT to decrease increased muscular spasms and trigger points with the patient positioned in supine. Patient was educated on risks and benefits of therapy and verbally consents to PT.   Cervical traction 10sec traction/10sec retraction x5; traction with R sidebend x5 C5-T1 R UPA grade III 4 bouts per segment, 30sec bouts    Ther-Ex - L levator stretch 10sec hold x3 - L UT stretch 10sec hold x3; decreased pain with this following TDN - Pec stretch in doorway 90/90 position, with pain, modified to 60d abd 10sec hold x3 - Prone T x8 with min cuing to prevent shoulder hiking, minimal pain with this - Standing row 10# x8 reps, with patient unable to complete with proper form, attempted with 5# with pain at posterior shoulder/upper arm. Able to complete when modified to seated OMEGA rows 5# 2x 8 with patient able to complete with good form following cuing without pain - Wall slides RTB 2x 8 with demo and cuing initially for proper form/posture with good carry over following                       PT Education - 03/11/19 1611    Education provided  Yes    Education Details  TDN, therex form    Person(s) Educated  Patient    Methods  Explanation;Demonstration;Tactile cues;Verbal cues    Comprehension  Verbalized understanding;Returned demonstration;Verbal cues required;Tactile cues required       PT Short Term Goals - 03/05/19 1630      PT SHORT TERM GOAL #1   Title  Pt will be independent with HEP in order to improve  strength and decrease back pain in order to improve pain-free function at home and work.    Time  4    Period  Weeks    Status  New        PT Long Term Goals - 03/05/19 1631      PT LONG TERM GOAL #1   Title  Pt will decrease worst neck pain as reported on NPRS by at least 2 points in order to demonstrate clinically significant reduction in back pain.    Baseline  03/05/19 2/10 with UE numbess/tingling    Time  8    Period  Weeks  Status  New      PT LONG TERM GOAL #2   Title  Pt will demonstrate full cervical rotation, without pain to demonstrate safety with driving    Baseline  03/05/19 R 60d L 63d with pain    Time  8    Period  Weeks    Status  New      PT LONG TERM GOAL #3   Title  Patient will increase FOTO score to 74 to demonstrate predicted increase in functional mobility to complete ADLs    Baseline  03/05/19 56    Time  8            Plan - 03/11/19 1618    Clinical Impression Statement  PT utilized manual and TDN to reduce muscle tension, and pain with good success, localized twitch response, and no pain following. Pt reports 2/10 pain following manual + TDN without redicular symptoms, and is able to complete therex progression without increase in pain, with some modifications. Pt requiring cuing for proper form/technique, with good carry over following. Will continue progression as able.    Personal Factors and Comorbidities  Age;Comorbidity 1;Comorbidity 2;Comorbidity 3+;Sex;Past/Current Experience    Comorbidities  VTach, Raynaud's PVs, pacemaker    Examination-Activity Limitations  Lift;Reach Overhead;Sleep    Examination-Participation Restrictions  Community Activity;Yard Work;Driving    Stability/Clinical Decision Making  Evolving/Moderate complexity    Clinical Decision Making  Moderate    Rehab Potential  Good    PT Frequency  2x / week    PT Duration  8 weeks    PT Treatment/Interventions  ADLs/Self Care Home Management;Therapeutic exercise;Electrical  Stimulation;Taping;Joint Manipulations;Spinal Manipulations;Dry needling;Manual techniques;Passive range of motion;Neuromuscular re-education;Balance training;Moist Heat;Traction;Ultrasound;Cryotherapy;Therapeutic activities;Patient/family education;Functional mobility training    PT Next Visit Plan  TDN, postural restoration    PT Home Exercise Plan  UT stretch, pec doorwary stretch, Prone row, prone T    Consulted and Agree with Plan of Care  Patient       Patient will benefit from skilled therapeutic intervention in order to improve the following deficits and impairments:  Increased fascial restricitons, Impaired sensation, Improper body mechanics, Pain, Postural dysfunction, Decreased mobility, Decreased activity tolerance, Decreased endurance, Decreased range of motion, Decreased strength, Impaired UE functional use, Impaired flexibility  Visit Diagnosis: 1. Radiculopathy, cervical region   2. Cervicalgia        Problem List Patient Active Problem List   Diagnosis Date Noted  . High risk medication use 02/24/2019  . Asthma 01/25/2019  . Cervical radiculopathy at C6 01/25/2019  . Chronic sinusitis of both maxillary sinuses 06/03/2018  . Encounter for preventive health examination 02/09/2018  . Eustachian tube disorder, left 07/08/2017  . Cystocele, midline 06/19/2016  . Cardiac arrest ([place]) 05/14/2016  . Ventricular premature depolarization 05/14/2016  . Non-dose-related adverse reaction to medication 03/13/2016  . Urinary frequency 09/16/2015  . Bladder prolapse, female, acquired 09/08/2015  . Other intervertebral disc degeneration, lumbar region 01/25/2015  . Sacroiliac joint dysfunction of both sides 01/25/2015  . Spinal stenosis, lumbar region, with neurogenic claudication 01/25/2015  . Facet syndrome, lumbar 01/25/2015  . Lumbar radiculopathy 01/25/2015  . Calculus of gallbladder without cholecystitis without obstruction 12/23/2014  . Polyarthritis 03/16/2014  .  Thrombocytopenia ([place]) 09/11/2013  . Other malaise 09/10/2013  . Major depressive disorder, single episode 09/10/2013  . Raynaud phenomenon 04/17/2013  . Raynaud's syndrome without gangrene 04/17/2013  . Low back pain 12/05/2012  . Shortness of breath 06/24/2012  . Aborted cardiac arrest   .  ICD (implantable cardioverter-defibrillator) in place   . PVC's / ventricular tachycardia   . 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 Irritable bowel syndrome with constipation    Shelton Silvas PT, DPT Shelton Silvas 03/11/2019, 4:30 PM  [facility] [address], [place], [address] Phone: [phone]   Fax:  [phone]  Name: NORVELLA LOSCALZO MRN: [id number] Date of Birth: 1956/07/22

## 2019-03-13 DIAGNOSIS — I5030 Unspecified diastolic (congestive) heart failure: Secondary | ICD-10-CM | POA: Insufficient documentation

## 2019-03-16 ENCOUNTER — Encounter: Payer: Medicare HMO | Admitting: Physical Therapy

## 2019-03-18 ENCOUNTER — Encounter: Payer: Self-pay | Admitting: Physical Therapy

## 2019-03-18 ENCOUNTER — Other Ambulatory Visit: Payer: Self-pay

## 2019-03-18 ENCOUNTER — Ambulatory Visit: Payer: Medicare HMO | Admitting: Physical Therapy

## 2019-03-18 DIAGNOSIS — M5412 Radiculopathy, cervical region: Secondary | ICD-10-CM | POA: Diagnosis not present

## 2019-03-18 DIAGNOSIS — M542 Cervicalgia: Secondary | ICD-10-CM | POA: Diagnosis not present

## 2019-03-18 NOTE — Therapy (Signed)
[facility] [address] [place], [address] Phone: [phone]   Fax:  [phone]  Physical Therapy Treatment  Patient Details  Name: Nicole Swanson MRN: [id number] Date of Birth: 23-Feb-1956 No data recorded  Encounter Date: 03/18/2019  PT End of Session - 03/18/19 1542    Visit Number  3    Number of Visits  17    Date for PT Re-Evaluation  04/30/19    PT Start Time  0315    PT Stop Time  0400    PT Time Calculation (min)  45 min       Past Medical History:  Diagnosis Date  . Aborted cardiac arrest 1994  . AICD (automatic cardioverter/defibrillator) present   . Allergy   . Anginal pain ([place])   . Arthritis   . Asthma    hx of asthma related to air bag release   . Dual implantable cardiac defibrillator    Medtronic initially implanted in 1997  . DVT (deep vein thrombosis) in pregnancy 1986   had heparin shots followed by coumadin  . Fall    10/11/17 walking dogs   . Family history of seizures    Question neurological versus cardiac  . Heart murmur   . Irritable bowel syndrome   . 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 PONV (postoperative nausea and vomiting)   . Presence of permanent cardiac pacemaker   . PVC's (premature ventricular contractions)   . Raynaud's syndrome   . Shortness of breath dyspnea    with exertion   . Ventricular tachycardia [facility])     Past Surgical History:  Procedure Laterality Date  . ABDOMINAL HYSTERECTOMY    . APPENDECTOMY    . CARDIAC DEFIBRILLATOR PLACEMENT  1994   Dual chamber with pacemaker  . CARPAL TUNNEL RELEASE Right   . COLONOSCOPY WITH PROPOFOL N/A 01/18/2017   Procedure: COLONOSCOPY WITH PROPOFOL;  Surgeon: Manya Silvas, MD;  Location: [facility] ENDOSCOPY;  Service: Endoscopy;  Laterality: N/A;  . CYSTOCELE REPAIR N/A 06/19/2016   Procedure: ANTERIOR REPAIR (CYSTOCELE) WITH VAULT PROLAPSE;  Surgeon: Bjorn Loser, MD;  Location: [facility]  ORS;  Service: Urology;  Laterality: N/A;  . CYSTOSCOPY N/A 06/19/2016   Procedure: CYSTOSCOPY;  Surgeon: Bjorn Loser, MD;  Location: [facility] ORS;  Service: Urology;  Laterality: N/A;  . ESOPHAGOGASTRODUODENOSCOPY (EGD) WITH PROPOFOL N/A 01/18/2017   Procedure: ESOPHAGOGASTRODUODENOSCOPY (EGD) WITH PROPOFOL;  Surgeon: Manya Silvas, MD;  Location: [facility] ENDOSCOPY;  Service: Endoscopy;  Laterality: N/A;  . LAPAROSCOPIC LYSIS OF ADHESIONS    . NASAL SEPTUM SURGERY  [address]   . OOPHORECTOMY    . PACEMAKER INSERTION     5 different devices    There were no vitals filed for this visit.  Subjective Assessment - 03/18/19 1519    Subjective  Patinet reports last session her pain was a 2-3/10 and has been better since. Some pain with bilat cervical rotation. Reports no numbness/tingling, just pain the LUE with motion    Pertinent History  Pt is a 63 year old female reporting with L sided neck and UE pain since March 2020. Patient reports having a cervical Xray this year that demonstrated "bone spurs in the cervical spine". Had predinsone taper last month that "somewhat helped", and that patient wanted a CT scan, but insurance will not cover this until PT for 6wks. Reports pain relief following predisone taper that was short lived,  and that continued NSAIDs help pain but she does not tolerate them well. Pt is retired, enjoys walking, has started wt training 2x/week. Reports pain begins on L side of the neck and travels down the LUE, but reports no more numbess/tingling sensation since predinsone tapper. Worst pain over past week 2/10, 0/10. Prior to predisone taper, reports she could not complete heavier household chores.Pt denies N/V, B&B changes, unexplained weight fluctuation, saddle paresthesia, fever, night sweats, or unrelenting night pain at this time.    Limitations  Other (comment)    How long can you sit comfortably?  unlimited    How long can you stand comfortably?  ulimited     How long can you walk comfortably?  unlimited    Diagnostic tests  Xray    Patient Stated Goals  Maintain decreased pain without NSAIDs       Manual STM with trigger point release to L UT, levator and cervical paraspinals. Following: Dry Needling: (2/1) 27mm .30 needles placed along the L levator and L UT to decrease increased muscular spasms and trigger points with the patient positioned in supine. Patient was educated on risks and benefits of therapy and verbally consents to PT.  C5-T1 R UPA grade III 4 bouts per segment, 30sec bouts    Ther-Ex - Scaption with RTB at wrist 3x 8/8/6 with cuing to prevent shoulder hiking and for maintained scapular retraction with decent carry over.  - GTB band pull aparts with shoulders 90d flex 3x 8 with cuing for scapular retraction with good carry over following - Lat pulldown 15# 3x 10 with cuing initially for proper form without shoulder hiking with good carry over following - SCM stretch 30sec hold                          PT Education - 03/18/19 1541    Education provided  Yes    Education Details  TDN, therex form    Person(s) Educated  Patient    Methods  Explanation;Demonstration;Verbal cues;Tactile cues    Comprehension  Verbalized understanding;Returned demonstration;Verbal cues required;Tactile cues required       PT Short Term Goals - 03/05/19 1630      PT SHORT TERM GOAL #1   Title  Pt will be independent with HEP in order to improve strength and decrease back pain in order to improve pain-free function at home and work.    Time  4    Period  Weeks    Status  New        PT Long Term Goals - 03/05/19 1631      PT LONG TERM GOAL #1   Title  Pt will decrease worst neck pain as reported on NPRS by at least 2 points in order to demonstrate clinically significant reduction in back pain.    Baseline  03/05/19 2/10 with UE numbess/tingling    Time  8    Period  Weeks    Status  New      PT LONG TERM GOAL #2    Title  Pt will demonstrate full cervical rotation, without pain to demonstrate safety with driving    Baseline  03/05/19 R 60d L 63d with pain    Time  8    Period  Weeks    Status  New      PT LONG TERM GOAL #3   Title  Patient will increase FOTO score to 74 to demonstrate predicted increase in  functional mobility to complete ADLs    Baseline  03/05/19 56    Time  8            Plan - 03/18/19 1656    Clinical Impression Statement  PT continued to utilize manual and TDN to ease muscular restirctions with improved cervical rotation following. PT continued therex progression, which patient is able to complete with accuracy following cuing from PT. Pt reports only 1/10 pain at the end of session. PT educated on neutral cervical posture, without upper cervical ext to maintain proper length tension relationship with patient able to demonstrate this. PT will continue progression as able.    Personal Factors and Comorbidities  Age;Comorbidity 1;Comorbidity 2;Comorbidity 3+;Sex;Past/Current Experience    Comorbidities  VTach, Raynaud's PVs, pacemaker    Examination-Activity Limitations  Lift;Reach Overhead;Sleep    Examination-Participation Restrictions  Community Activity;Yard Work;Driving    Stability/Clinical Decision Making  Evolving/Moderate complexity    Clinical Decision Making  Moderate    Rehab Potential  Good    PT Frequency  2x / week    PT Duration  8 weeks    PT Treatment/Interventions  ADLs/Self Care Home Management;Therapeutic exercise;Electrical Stimulation;Taping;Joint Manipulations;Spinal Manipulations;Dry needling;Manual techniques;Passive range of motion;Neuromuscular re-education;Balance training;Moist Heat;Traction;Ultrasound;Cryotherapy;Therapeutic activities;Patient/family education;Functional mobility training    PT Next Visit Plan  TDN, postural restoration    PT Home Exercise Plan  UT stretch, pec doorwary stretch, Prone row, prone T    Consulted and Agree with Plan  of Care  Patient       Patient will benefit from skilled therapeutic intervention in order to improve the following deficits and impairments:  Increased fascial restricitons, Impaired sensation, Improper body mechanics, Pain, Postural dysfunction, Decreased mobility, Decreased activity tolerance, Decreased endurance, Decreased range of motion, Decreased strength, Impaired UE functional use, Impaired flexibility  Visit Diagnosis: 1. Radiculopathy, cervical region   2. Cervicalgia        Problem List Patient Active Problem List   Diagnosis Date Noted  . (HFpEF) heart failure with preserved ejection fraction ([place]) 03/13/2019  . High risk medication use 02/24/2019  . Asthma 01/25/2019  . Cervical radiculopathy at C6 01/25/2019  . Chronic sinusitis of both maxillary sinuses 06/03/2018  . Encounter for preventive health examination 02/09/2018  . Eustachian tube disorder, left 07/08/2017  . Cystocele, midline 06/19/2016  . Cardiac arrest ([place]) 05/14/2016  . Ventricular premature depolarization 05/14/2016  . Non-dose-related adverse reaction to medication 03/13/2016  . Urinary frequency 09/16/2015  . Bladder prolapse, female, acquired 09/08/2015  . Other intervertebral disc degeneration, lumbar region 01/25/2015  . Sacroiliac joint dysfunction of both sides 01/25/2015  . Spinal stenosis, lumbar region, with neurogenic claudication 01/25/2015  . Facet syndrome, lumbar 01/25/2015  . Lumbar radiculopathy 01/25/2015  . Calculus of gallbladder without cholecystitis without obstruction 12/23/2014  . Polyarthritis 03/16/2014  . Thrombocytopenia ([place]) 09/11/2013  . Other malaise 09/10/2013  . Major depressive disorder, single episode 09/10/2013  . Raynaud phenomenon 04/17/2013  . Raynaud's syndrome without gangrene 04/17/2013  . Low back pain 12/05/2012  . Shortness of breath 06/17/2012  . Aborted cardiac arrest   . ICD (implantable cardioverter-defibrillator) in place   . PVC's /  ventricular tachycardia   . 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 Irritable bowel syndrome with constipation    Shelton Silvas PT, DPT Shelton Silvas 03/18/2019, 5:00 PM  [facility] [address] [place], [address] Phone: [phone]   Fax:  [phone]  Name: Nicole Swanson MRN: [id number] Date of Birth: 1955-12-18

## 2019-03-20 ENCOUNTER — Other Ambulatory Visit: Payer: Self-pay

## 2019-03-20 ENCOUNTER — Encounter: Payer: Self-pay | Admitting: Physical Therapy

## 2019-03-20 ENCOUNTER — Ambulatory Visit: Payer: Medicare HMO | Admitting: Physical Therapy

## 2019-03-20 DIAGNOSIS — M5412 Radiculopathy, cervical region: Secondary | ICD-10-CM | POA: Diagnosis not present

## 2019-03-20 DIAGNOSIS — M542 Cervicalgia: Secondary | ICD-10-CM

## 2019-03-20 NOTE — Therapy (Signed)
[place] [facility] [address], [place], [address] Phone: [phone]   Fax:  [phone]  Physical Therapy Treatment  Patient Details  Name: Nicole Swanson MRN: [id number] Date of Birth: [birth year] No data recorded  Encounter Date: 03/20/2019  PT End of Session - 03/20/19 0947    Visit Number  4    Number of Visits  17    Date for PT Re-Evaluation  04/30/19    PT Start Time  0915    PT Stop Time  1000    PT Time Calculation (min)  45 min    Activity Tolerance  Patient tolerated treatment well    Behavior During Therapy  [facility] for tasks assessed/performed       Past Medical History:  Diagnosis Date  . Aborted cardiac arrest 1994  . AICD (automatic cardioverter/defibrillator) present   . Allergy   . Anginal pain ([place])   . Arthritis   . Asthma    hx of asthma related to air bag release   . Dual implantable cardiac defibrillator    Medtronic initially implanted in 1997  . DVT (deep vein thrombosis) in pregnancy 1986   had heparin shots followed by coumadin  . Fall    10/11/17 walking dogs   . Family history of seizures    Question neurological versus cardiac  . Heart murmur   . Irritable bowel syndrome   . 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 PONV (postoperative nausea and vomiting)   . Presence of permanent cardiac pacemaker   . PVC's (premature ventricular contractions)   . Raynaud's syndrome   . Shortness of breath dyspnea    with exertion   . Ventricular tachycardia [facility])     Past Surgical History:  Procedure Laterality Date  . ABDOMINAL HYSTERECTOMY    . APPENDECTOMY    . CARDIAC DEFIBRILLATOR PLACEMENT  1994   Dual chamber with pacemaker  . CARPAL TUNNEL RELEASE Right   . COLONOSCOPY WITH PROPOFOL N/A 01/18/2017   Procedure: COLONOSCOPY WITH PROPOFOL;  Surgeon: Manya Silvas, MD;  Location: [facility] ENDOSCOPY;  Service: Endoscopy;  Laterality: N/A;  . CYSTOCELE REPAIR N/A  06/19/2016   Procedure: ANTERIOR REPAIR (CYSTOCELE) WITH VAULT PROLAPSE;  Surgeon: Bjorn Loser, MD;  Location: [facility] ORS;  Service: Urology;  Laterality: N/A;  . CYSTOSCOPY N/A 06/19/2016   Procedure: CYSTOSCOPY;  Surgeon: Bjorn Loser, MD;  Location: [facility] ORS;  Service: Urology;  Laterality: N/A;  . ESOPHAGOGASTRODUODENOSCOPY (EGD) WITH PROPOFOL N/A 01/18/2017   Procedure: ESOPHAGOGASTRODUODENOSCOPY (EGD) WITH PROPOFOL;  Surgeon: Manya Silvas, MD;  Location: [facility] ENDOSCOPY;  Service: Endoscopy;  Laterality: N/A;  . LAPAROSCOPIC LYSIS OF ADHESIONS    . NASAL SEPTUM SURGERY  [address]   . OOPHORECTOMY    . PACEMAKER INSERTION     5 different devices    There were no vitals filed for this visit.  Subjective Assessment - 03/20/19 0918    Subjective  Patient reports her shoulders are sore today, with some less pain. Reports some pain with overhead motion with LUE. Reports she only had tingling/numbness into LUE 1x yesterday. Reports 1/10 pain today.    Pertinent History  Pt is a 63 year old female reporting with L sided neck and UE pain since March 2020. Patient reports having a cervical Xray this year that demonstrated "bone spurs in the cervical spine". Had predinsone taper last month that "somewhat helped", and that patient  wanted a CT scan, but insurance will not cover this until PT for 6wks. Reports pain relief following predisone taper that was short lived, and that continued NSAIDs help pain but she does not tolerate them well. Pt is retired, enjoys walking, has started wt training 2x/week. Reports pain begins on L side of the neck and travels down the LUE, but reports no more numbess/tingling sensation since predinsone tapper. Worst pain over past week 2/10, 0/10. Prior to predisone taper, reports she could not complete heavier household chores.Pt denies N/V, B&B changes, unexplained weight fluctuation, saddle paresthesia, fever, night sweats, or unrelenting night pain at  this time.    Limitations  Other (comment)    How long can you sit comfortably?  unlimited    How long can you stand comfortably?  ulimited    How long can you walk comfortably?  unlimited    Diagnostic tests  Xray    Patient Stated Goals  Maintain decreased pain without NSAIDs       Manual STM withtrigger point releaseto bilat UT, levator, suboccipitals, and SCM and cervical paraspinals. C5-T1 R UPA grade III 4 bouts per segment, 30sec bouts Cervical traction 10sec traction 10sec relax x10 Patient asked about inversion table to provide traction and pain relief. PT suggested self lumbar traction and over the door cervical traction before making such a large investment. PT put patient into gravity dependent lumbar traction with theraball under LE during manual supine techniques, which patient reports pain relief from   Ther-Ex - Prone snow angels in small ROM within pain limitations 3x 6 with cuing for maintained scapular retraction  - High rows 5# 3x 8 with max cuing for scapular retraction, with decent carry over following - low rows/shoulder ext RTB 3x 8 with cuing initially for combined scapular retraction + depression with good carry over following                        PT Education - 03/20/19 0945    Education provided  Yes    Education Details  therex form, traction education    Person(s) Educated  Patient    Methods  Explanation;Demonstration;Verbal cues    Comprehension  Verbalized understanding;Returned demonstration;Verbal cues required       PT Short Term Goals - 03/05/19 1630      PT SHORT TERM GOAL #1   Title  Pt will be independent with HEP in order to improve strength and decrease back pain in order to improve pain-free function at home and work.    Time  4    Period  Weeks    Status  New        PT Long Term Goals - 03/05/19 1631      PT LONG TERM GOAL #1   Title  Pt will decrease worst neck pain as reported on NPRS by at least 2  points in order to demonstrate clinically significant reduction in back pain.    Baseline  03/05/19 2/10 with UE numbess/tingling    Time  8    Period  Weeks    Status  New      PT LONG TERM GOAL #2   Title  Pt will demonstrate full cervical rotation, without pain to demonstrate safety with driving    Baseline  03/05/19 R 60d L 63d with pain    Time  8    Period  Weeks    Status  New      PT  LONG TERM GOAL #3   Title  Patient will increase FOTO score to 74 to demonstrate predicted increase in functional mobility to complete ADLs    Baseline  03/05/19 56    Time  8            Plan - 03/20/19 1021    Clinical Impression Statement  PT continued manual techniques for muscle relaxation and pain manamgent. Patient reports no pain with cervical rotation following. Educated paitent on self traction at home for pain relief, which she verbalized understanding of. Continued therex progression within patients tolerance, as she has increased soreness to date, with patient able to complete all therex following cuing.    Personal Factors and Comorbidities  Age;Comorbidity 1;Comorbidity 2;Comorbidity 3+;Sex;Past/Current Experience    Comorbidities  VTach, Raynaud's PVs, pacemaker    Examination-Activity Limitations  Lift;Reach Overhead;Sleep    Examination-Participation Restrictions  Community Activity;Yard Work;Driving    Stability/Clinical Decision Making  Evolving/Moderate complexity    Rehab Potential  Good    PT Frequency  2x / week    PT Duration  8 weeks    PT Treatment/Interventions  ADLs/Self Care Home Management;Therapeutic exercise;Electrical Stimulation;Taping;Joint Manipulations;Spinal Manipulations;Dry needling;Manual techniques;Passive range of motion;Neuromuscular re-education;Balance training;Moist Heat;Traction;Ultrasound;Cryotherapy;Therapeutic activities;Patient/family education;Functional mobility training    PT Next Visit Plan  TDN, postural restoration    PT Home Exercise Plan   UT stretch, pec doorwary stretch, Prone row, prone T    Consulted and Agree with Plan of Care  Patient       Patient will benefit from skilled therapeutic intervention in order to improve the following deficits and impairments:  Increased fascial restricitons, Impaired sensation, Improper body mechanics, Pain, Postural dysfunction, Decreased mobility, Decreased activity tolerance, Decreased endurance, Decreased range of motion, Decreased strength, Impaired UE functional use, Impaired flexibility  Visit Diagnosis: 1. Radiculopathy, cervical region   2. Cervicalgia        Problem List Patient Active Problem List   Diagnosis Date Noted  . (HFpEF) heart failure with preserved ejection fraction ([place]) 03/13/2019  . High risk medication use 02/24/2019  . Asthma 01/25/2019  . Cervical radiculopathy at C6 01/25/2019  . Chronic sinusitis of both maxillary sinuses 06/03/2018  . Encounter for preventive health examination 02/09/2018  . Eustachian tube disorder, left 07/08/2017  . Cystocele, midline 06/19/2016  . Cardiac arrest ([place]) 05/14/2016  . Ventricular premature depolarization 05/14/2016  . Non-dose-related adverse reaction to medication 03/13/2016  . Urinary frequency 09/16/2015  . Bladder prolapse, female, acquired 09/08/2015  . Other intervertebral disc degeneration, lumbar region 01/25/2015  . Sacroiliac joint dysfunction of both sides 01/25/2015  . Spinal stenosis, lumbar region, with neurogenic claudication 01/25/2015  . Facet syndrome, lumbar 01/25/2015  . Lumbar radiculopathy 01/25/2015  . Calculus of gallbladder without cholecystitis without obstruction 12/23/2014  . Polyarthritis 03/16/2014  . Thrombocytopenia ([place]) 09/11/2013  . Other malaise 09/10/2013  . Major depressive disorder, single episode 09/10/2013  . Raynaud phenomenon 04/17/2013  . Raynaud's syndrome without gangrene 04/17/2013  . Low back pain 12/05/2012  . Shortness of breath 06/14/2012  . Aborted  cardiac arrest   . ICD (implantable cardioverter-defibrillator) in place   . PVC's / ventricular tachycardia   . 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 Irritable bowel syndrome with constipation    Shelton Silvas PT, DPT Shelton Silvas 03/20/2019, 10:34 AM  [place] [facility] [address] [place], [address] Phone: [phone]   Fax:  [phone]  Name: [place]  KAIRY FOLSOM MRN: [id number] Date of Birth: 1956-07-15

## 2019-03-23 ENCOUNTER — Encounter: Payer: Medicare HMO | Admitting: Physical Therapy

## 2019-03-25 ENCOUNTER — Other Ambulatory Visit: Payer: Self-pay

## 2019-03-25 ENCOUNTER — Ambulatory Visit: Payer: Medicare HMO | Admitting: Physical Therapy

## 2019-03-25 ENCOUNTER — Encounter: Payer: Self-pay | Admitting: Physical Therapy

## 2019-03-25 DIAGNOSIS — M542 Cervicalgia: Secondary | ICD-10-CM | POA: Diagnosis not present

## 2019-03-25 DIAGNOSIS — M5412 Radiculopathy, cervical region: Secondary | ICD-10-CM | POA: Diagnosis not present

## 2019-03-25 NOTE — Therapy (Signed)
[place] [facility] [address], [place], [address] Phone: [phone]   Fax:  [phone]  Physical Therapy Treatment  Patient Details  Name: Nicole Swanson MRN: [id number] Date of Birth: [birth year] No data recorded  Encounter Date: 03/25/2019  PT End of Session - 03/25/19 1545    Visit Number  5    Number of Visits  17    Date for PT Re-Evaluation  04/30/19    PT Start Time  0315    PT Stop Time  0400    PT Time Calculation (min)  45 min    Activity Tolerance  Patient tolerated treatment well    Behavior During Therapy  [facility] for tasks assessed/performed       Past Medical History:  Diagnosis Date  . Aborted cardiac arrest 1994  . AICD (automatic cardioverter/defibrillator) present   . Allergy   . Anginal pain ([place])   . Arthritis   . Asthma    hx of asthma related to air bag release   . Dual implantable cardiac defibrillator    Medtronic initially implanted in 1997  . DVT (deep vein thrombosis) in pregnancy 1986   had heparin shots followed by coumadin  . Fall    10/11/17 walking dogs   . Family history of seizures    Question neurological versus cardiac  . Heart murmur   . Irritable bowel syndrome   . 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 PONV (postoperative nausea and vomiting)   . Presence of permanent cardiac pacemaker   . PVC's (premature ventricular contractions)   . Raynaud's syndrome   . Shortness of breath dyspnea    with exertion   . Ventricular tachycardia [facility])     Past Surgical History:  Procedure Laterality Date  . ABDOMINAL HYSTERECTOMY    . APPENDECTOMY    . CARDIAC DEFIBRILLATOR PLACEMENT  1994   Dual chamber with pacemaker  . CARPAL TUNNEL RELEASE Right   . COLONOSCOPY WITH PROPOFOL N/A 01/18/2017   Procedure: COLONOSCOPY WITH PROPOFOL;  Surgeon: Manya Silvas, MD;  Location: [facility] ENDOSCOPY;  Service: Endoscopy;  Laterality: N/A;  . CYSTOCELE REPAIR N/A  06/19/2016   Procedure: ANTERIOR REPAIR (CYSTOCELE) WITH VAULT PROLAPSE;  Surgeon: Bjorn Loser, MD;  Location: [facility] ORS;  Service: Urology;  Laterality: N/A;  . CYSTOSCOPY N/A 06/19/2016   Procedure: CYSTOSCOPY;  Surgeon: Bjorn Loser, MD;  Location: [facility] ORS;  Service: Urology;  Laterality: N/A;  . ESOPHAGOGASTRODUODENOSCOPY (EGD) WITH PROPOFOL N/A 01/18/2017   Procedure: ESOPHAGOGASTRODUODENOSCOPY (EGD) WITH PROPOFOL;  Surgeon: Manya Silvas, MD;  Location: [facility] ENDOSCOPY;  Service: Endoscopy;  Laterality: N/A;  . LAPAROSCOPIC LYSIS OF ADHESIONS    . NASAL SEPTUM SURGERY  [address]   . OOPHORECTOMY    . PACEMAKER INSERTION     5 different devices    There were no vitals filed for this visit.  Subjective Assessment - 03/25/19 1516    Subjective  Patient reports her L shoulder has been bothering her more since yesterday with an ache into the LUE from her neck.    Pertinent History  Pt is a 63 year old female reporting with L sided neck and UE pain since March 2020. Patient reports having a cervical Xray this year that demonstrated "bone spurs in the cervical spine". Had predinsone taper last month that "somewhat helped", and that patient wanted a CT scan, but insurance will not cover this until  PT for 6wks. Reports pain relief following predisone taper that was short lived, and that continued NSAIDs help pain but she does not tolerate them well. Pt is retired, enjoys walking, has started wt training 2x/week. Reports pain begins on L side of the neck and travels down the LUE, but reports no more numbess/tingling sensation since predinsone tapper. Worst pain over past week 2/10, 0/10. Prior to predisone taper, reports she could not complete heavier household chores.Pt denies N/V, B&B changes, unexplained weight fluctuation, saddle paresthesia, fever, night sweats, or unrelenting night pain at this time.    Limitations  Other (comment)    How long can you sit comfortably?   unlimited    How long can you stand comfortably?  unlimited    How long can you walk comfortably?  unlimited    Diagnostic tests  Xray    Patient Stated Goals  Maintain decreased pain without NSAIDs       Manual STM withtrigger point releaseto bilat UT, levator, suboccipitals, and SCM and cervical paraspinals. Following C5-T1 R UPA grade III 4 bouts per segment, 30sec bouts Cervical traction 10sec traction 10sec relax x10   Ther-Ex - Chin tuck with lift supine x10; with 5sec hold x5; with 10sec hold x5 Prone snow angels in small ROM within pain limitations 3x 6 with cuing for maintained scapular retraction  - Neutral grip row 10# 3x 10 with cuing initially for proper posture with neutral cspine and scapular retraction without shoulder hiking with good carry over following - Scapular wall push up 3x 10 with demo and cuing initially for proper form with good carry over following - Band pull aparts GTB 2x 10 with cuing to prevent shoulder hiking with good carry over following                        PT Education - 03/25/19 1543    Education provided  Yes    Education Details  therex form; postural education    Person(s) Educated  Patient    Methods  Explanation;Demonstration;Tactile cues;Verbal cues;Handout    Comprehension  Verbalized understanding;Returned demonstration;Verbal cues required;Tactile cues required       PT Short Term Goals - 03/05/19 1630      PT SHORT TERM GOAL #1   Title  Pt will be independent with HEP in order to improve strength and decrease back pain in order to improve pain-free function at home and work.    Time  4    Period  Weeks    Status  New        PT Long Term Goals - 03/05/19 1631      PT LONG TERM GOAL #1   Title  Pt will decrease worst neck pain as reported on NPRS by at least 2 points in order to demonstrate clinically significant reduction in back pain.    Baseline  03/05/19 2/10 with UE numbess/tingling    Time   8    Period  Weeks    Status  New      PT LONG TERM GOAL #2   Title  Pt will demonstrate full cervical rotation, without pain to demonstrate safety with driving    Baseline  03/05/19 R 60d L 63d with pain    Time  8    Period  Weeks    Status  New      PT LONG TERM GOAL #3   Title  Patient will increase FOTO score to 74 to  demonstrate predicted increase in functional mobility to complete ADLs    Baseline  03/05/19 56    Time  8            Plan - 03/25/19 1559    Clinical Impression Statement  Patient reports only 1/10 pain following manual techniques and TDN with good centralization of symptoms. Patient is able to complete all therex with accuracy following PT demo and cuing, with good postural carry over throughout advancing therex. PT educated patient on importance of postural carry over for optimal mechanical advantage, which she verbalizes understanding of. WIll continue progression as able.    Personal Factors and Comorbidities  Age;Comorbidity 1;Comorbidity 2;Comorbidity 3+;Sex;Past/Current Experience    Comorbidities  VTach, Raynaud's PVs, pacemaker    Examination-Activity Limitations  Lift;Reach Overhead;Sleep    Examination-Participation Restrictions  Community Activity;Yard Work;Driving    Stability/Clinical Decision Making  Evolving/Moderate complexity    Clinical Decision Making  Moderate    Rehab Potential  Good    PT Frequency  2x / week    PT Duration  8 weeks    PT Treatment/Interventions  ADLs/Self Care Home Management;Therapeutic exercise;Electrical Stimulation;Taping;Joint Manipulations;Spinal Manipulations;Dry needling;Manual techniques;Passive range of motion;Neuromuscular re-education;Balance training;Moist Heat;Traction;Ultrasound;Cryotherapy;Therapeutic activities;Patient/family education;Functional mobility training    PT Next Visit Plan  TDN, postural restoration    PT Home Exercise Plan  UT stretch, pec doorwary stretch, Prone row, prone T    Consulted and  Agree with Plan of Care  Patient       Patient will benefit from skilled therapeutic intervention in order to improve the following deficits and impairments:  Increased fascial restricitons, Impaired sensation, Improper body mechanics, Pain, Postural dysfunction, Decreased mobility, Decreased activity tolerance, Decreased endurance, Decreased range of motion, Decreased strength, Impaired UE functional use, Impaired flexibility  Visit Diagnosis: 1. Radiculopathy, cervical region   2. Cervicalgia        Problem List Patient Active Problem List   Diagnosis Date Noted  . (HFpEF) heart failure with preserved ejection fraction ([place]) 03/13/2019  . High risk medication use 02/24/2019  . Asthma 01/25/2019  . Cervical radiculopathy at C6 01/25/2019  . Chronic sinusitis of both maxillary sinuses 06/03/2018  . Encounter for preventive health examination 02/09/2018  . Eustachian tube disorder, left 07/08/2017  . Cystocele, midline 06/19/2016  . Cardiac arrest ([place]) 05/14/2016  . Ventricular premature depolarization 05/14/2016  . Non-dose-related adverse reaction to medication 03/13/2016  . Urinary frequency 09/16/2015  . Bladder prolapse, female, acquired 09/08/2015  . Other intervertebral disc degeneration, lumbar region 01/25/2015  . Sacroiliac joint dysfunction of both sides 01/25/2015  . Spinal stenosis, lumbar region, with neurogenic claudication 01/25/2015  . Facet syndrome, lumbar 01/25/2015  . Lumbar radiculopathy 01/25/2015  . Calculus of gallbladder without cholecystitis without obstruction 12/23/2014  . Polyarthritis 03/16/2014  . Thrombocytopenia ([place]) 09/11/2013  . Other malaise 09/10/2013  . Major depressive disorder, single episode 09/10/2013  . Raynaud phenomenon 04/17/2013  . Raynaud's syndrome without gangrene 04/17/2013  . Low back pain 12/05/2012  . Shortness of breath 06/13/2012  . Aborted cardiac arrest   . ICD (implantable cardioverter-defibrillator) in place    . PVC's / ventricular tachycardia   . 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 Irritable bowel syndrome with constipation    Shelton Silvas PT, DPT Shelton Silvas 03/25/2019, 5:03 PM  [place] [facility] [address] [place], [address] Phone: [phone]   Fax:  [phone]  Name: Nicole Swanson MRN:  [id number] Date of Birth: August 24, 1956

## 2019-03-27 ENCOUNTER — Other Ambulatory Visit: Payer: Self-pay

## 2019-03-27 ENCOUNTER — Encounter: Payer: Self-pay | Admitting: Physical Therapy

## 2019-03-27 ENCOUNTER — Ambulatory Visit: Payer: Medicare HMO | Admitting: Physical Therapy

## 2019-03-27 DIAGNOSIS — M542 Cervicalgia: Secondary | ICD-10-CM | POA: Diagnosis not present

## 2019-03-27 DIAGNOSIS — M5412 Radiculopathy, cervical region: Secondary | ICD-10-CM

## 2019-03-27 NOTE — Therapy (Signed)
[place] [facility] [address] [place], [address] Phone: [phone]   Fax:  [phone]  Physical Therapy Treatment  Patient Details  Name: Nicole Swanson MRN: [id number] Date of Birth: 03/11/1956 No data recorded  Encounter Date: 03/27/2019  PT End of Session - 03/27/19 0953    Visit Number  6    Number of Visits  17    Date for PT Re-Evaluation  04/30/19    PT Start Time  0915    PT Stop Time  1000    PT Time Calculation (min)  45 min    Activity Tolerance  Patient tolerated treatment well    Behavior During Therapy  [facility] for tasks assessed/performed       Past Medical History:  Diagnosis Date  . Aborted cardiac arrest 1994  . AICD (automatic cardioverter/defibrillator) present   . Allergy   . Anginal pain ([place])   . Arthritis   . Asthma    hx of asthma related to air bag release   . Dual implantable cardiac defibrillator    Medtronic initially implanted in 1997  . DVT (deep vein thrombosis) in pregnancy 1986   had heparin shots followed by coumadin  . Fall    10/11/17 walking dogs   . Family history of seizures    Question neurological versus cardiac  . Heart murmur   . Irritable bowel syndrome   . 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 PONV (postoperative nausea and vomiting)   . Presence of permanent cardiac pacemaker   . PVC's (premature ventricular contractions)   . Raynaud's syndrome   . Shortness of breath dyspnea    with exertion   . Ventricular tachycardia [facility])     Past Surgical History:  Procedure Laterality Date  . ABDOMINAL HYSTERECTOMY    . APPENDECTOMY    . CARDIAC DEFIBRILLATOR PLACEMENT  1994   Dual chamber with pacemaker  . CARPAL TUNNEL RELEASE Right   . COLONOSCOPY WITH PROPOFOL N/A 01/18/2017   Procedure: COLONOSCOPY WITH PROPOFOL;  Surgeon: Manya Silvas, MD;  Location: [facility] ENDOSCOPY;  Service: Endoscopy;  Laterality: N/A;  . CYSTOCELE REPAIR N/A  06/19/2016   Procedure: ANTERIOR REPAIR (CYSTOCELE) WITH VAULT PROLAPSE;  Surgeon: Bjorn Loser, MD;  Location: [facility] ORS;  Service: Urology;  Laterality: N/A;  . CYSTOSCOPY N/A 06/19/2016   Procedure: CYSTOSCOPY;  Surgeon: Bjorn Loser, MD;  Location: [facility] ORS;  Service: Urology;  Laterality: N/A;  . ESOPHAGOGASTRODUODENOSCOPY (EGD) WITH PROPOFOL N/A 01/18/2017   Procedure: ESOPHAGOGASTRODUODENOSCOPY (EGD) WITH PROPOFOL;  Surgeon: Manya Silvas, MD;  Location: [facility] ENDOSCOPY;  Service: Endoscopy;  Laterality: N/A;  . LAPAROSCOPIC LYSIS OF ADHESIONS    . NASAL SEPTUM SURGERY  [address]   . OOPHORECTOMY    . PACEMAKER INSERTION     5 different devices    There were no vitals filed for this visit.  Subjective Assessment - 03/27/19 0921    Subjective  Patient reports 2/10 pain in L side of the neck. Reports she is continuing to have some pain with rotation .    Pertinent History  Pt is a 63 year old female reporting with L sided neck and UE pain since March 2020. Patient reports having a cervical Xray this year that demonstrated "bone spurs in the cervical spine". Had predinsone taper last month that "somewhat helped", and that patient wanted a CT scan, but insurance will not cover this until  PT for 6wks. Reports pain relief following predisone taper that was short lived, and that continued NSAIDs help pain but she does not tolerate them well. Pt is retired, enjoys walking, has started wt training 2x/week. Reports pain begins on L side of the neck and travels down the LUE, but reports no more numbess/tingling sensation since predinsone tapper. Worst pain over past week 2/10, 0/10. Prior to predisone taper, reports she could not complete heavier household chores.Pt denies N/V, B&B changes, unexplained weight fluctuation, saddle paresthesia, fever, night sweats, or unrelenting night pain at this time.    Limitations  Other (comment)    How long can you sit comfortably?  unlimited     How long can you stand comfortably?  unlimited    How long can you walk comfortably?  unlimited    Diagnostic tests  Xray    Patient Stated Goals  Maintain decreased pain without NSAIDs       Manual STM withtrigger point releasetobilatUT, levator, suboccipitals, and SCMand cervical paraspinals.(increased time spent at L levator and subocciptals  Following Dry Needling: (1/1) 39mm .30 needles placed along the R Biceps to decrease increased muscular spasms and trigger points with the patient positioned in supine. Patient was educated on risks and benefits of therapy and verbally consents to PT.  C5-T1 R UPA grade III 4 bouts per segment, 30sec bouts Cervical traction 10sec traction 10sec relax x10   Ther-Ex - Lat pulldown 35# 3x 8/7/7 with some cuing for proper form, patient reporting some increased neck pain, better with correction; cuing for chin tuck - Seated row 20# 3x 10 with min cuing initially with good carry over following - Qped alt UE lifts 3x 8 with cuing for neutral cervical spine with good carry over following                                              PT Education - 03/27/19 0922    Education provided  Yes    Education Details  therex form; TDN    Person(s) Educated  Patient    Methods  Explanation;Verbal cues;Demonstration    Comprehension  Verbalized understanding;Returned demonstration;Verbal cues required       PT Short Term Goals - 03/05/19 1630      PT SHORT TERM GOAL #1   Title  Pt will be independent with HEP in order to improve strength and decrease back pain in order to improve pain-free function at home and work.    Time  4    Period  Weeks    Status  New        PT Long Term Goals - 03/05/19 1631      PT LONG TERM GOAL #1   Title  Pt will decrease worst neck pain as reported on NPRS by at least 2 points in order to demonstrate clinically significant reduction in back pain.    Baseline  03/05/19  2/10 with UE numbess/tingling    Time  8    Period  Weeks    Status  New      PT LONG TERM GOAL #2   Title  Pt will demonstrate full cervical rotation, without pain to demonstrate safety with driving    Baseline  03/05/19 R 60d L 63d with pain    Time  8    Period  Weeks    Status  New      PT LONG TERM GOAL #3   Title  Patient will increase FOTO score to 74 to demonstrate predicted increase in functional mobility to complete ADLs    Baseline  03/05/19 56    Time  8            Plan - 03/27/19 1002    Clinical Impression Statement  Pt continues to report good pain relief with TDN and manula techniques. Patient has some increased tension and pain at subocciptals this session, that imporves with prolonged manual techniques. PT is able to continue therex progression for postural carry over with periscapular strengthening with good success, and some cuing needed for correct alignment. Patient reports only 1/10 pain at the end of session, PT encouraged HEP for maintainence. PT will continue progression as able and pain management as needed    Personal Factors and Comorbidities  Age;Comorbidity 1;Comorbidity 2;Comorbidity 3+;Sex;Past/Current Experience    Comorbidities  VTach, Raynaud's PVs, pacemaker    Examination-Activity Limitations  Lift;Reach Overhead;Sleep    Examination-Participation Restrictions  Community Activity;Yard Work;Driving    Stability/Clinical Decision Making  Evolving/Moderate complexity    Clinical Decision Making  Moderate    Rehab Potential  Good    PT Frequency  2x / week    PT Duration  8 weeks    PT Treatment/Interventions  ADLs/Self Care Home Management;Therapeutic exercise;Electrical Stimulation;Taping;Joint Manipulations;Spinal Manipulations;Dry needling;Manual techniques;Passive range of motion;Neuromuscular re-education;Balance training;Moist Heat;Traction;Ultrasound;Cryotherapy;Therapeutic activities;Patient/family education;Functional mobility training    PT  Next Visit Plan  TDN, postural restoration    PT Home Exercise Plan  UT stretch, pec doorwary stretch, Prone row, prone T    Consulted and Agree with Plan of Care  Patient       Patient will benefit from skilled therapeutic intervention in order to improve the following deficits and impairments:     Visit Diagnosis: 1. Radiculopathy, cervical region   2. Cervicalgia        Problem List Patient Active Problem List   Diagnosis Date Noted  . (HFpEF) heart failure with preserved ejection fraction ([place]) 03/13/2019  . High risk medication use 02/24/2019  . Asthma 01/25/2019  . Cervical radiculopathy at C6 01/25/2019  . Chronic sinusitis of both maxillary sinuses 06/03/2018  . Encounter for preventive health examination 02/09/2018  . Eustachian tube disorder, left 07/08/2017  . Cystocele, midline 06/19/2016  . Cardiac arrest ([place]) 05/14/2016  . Ventricular premature depolarization 05/14/2016  . Non-dose-related adverse reaction to medication 03/13/2016  . Urinary frequency 09/16/2015  . Bladder prolapse, female, acquired 09/08/2015  . Other intervertebral disc degeneration, lumbar region 01/25/2015  . Sacroiliac joint dysfunction of both sides 01/25/2015  . Spinal stenosis, lumbar region, with neurogenic claudication 01/25/2015  . Facet syndrome, lumbar 01/25/2015  . Lumbar radiculopathy 01/25/2015  . Calculus of gallbladder without cholecystitis without obstruction 12/23/2014  . Polyarthritis 03/16/2014  . Thrombocytopenia ([place]) 09/11/2013  . Other malaise 09/10/2013  . Major depressive disorder, single episode 09/10/2013  . Raynaud phenomenon 04/17/2013  . Raynaud's syndrome without gangrene 04/17/2013  . Low back pain 12/05/2012  . Shortness of breath 06/15/2012  . Aborted cardiac arrest   . ICD (implantable cardioverter-defibrillator) in place   . PVC's / ventricular tachycardia   . 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 Irritable bowel syndrome with  constipation    Shelton Silvas PT, DPT Shelton Silvas 03/27/2019, 10:34 AM  [place] [facility] [address] [place] [address] [place], [place],  [place] Phone: [phone]   Fax:  [phone]  Name: Nicole Swanson MRN: [id number] Date of Birth: 1956-02-25

## 2019-03-30 ENCOUNTER — Encounter: Payer: Medicare HMO | Admitting: Physical Therapy

## 2019-03-31 ENCOUNTER — Ambulatory Visit (INDEPENDENT_AMBULATORY_CARE_PROVIDER_SITE_OTHER): Payer: Medicare HMO | Admitting: *Deleted

## 2019-03-31 DIAGNOSIS — I472 Ventricular tachycardia, unspecified: Secondary | ICD-10-CM

## 2019-03-31 LAB — CUP PACEART REMOTE DEVICE CHECK
Battery Voltage: 2.64 V
Brady Statistic AP VP Percent: 0.03 %
Brady Statistic AP VS Percent: 76.67 %
Brady Statistic AS VP Percent: 0.02 %
Brady Statistic AS VS Percent: 23.28 %
Brady Statistic RA Percent Paced: 76.16 %
Brady Statistic RV Percent Paced: 0.05 %
Date Time Interrogation Session: 20200728071803
HighPow Impedance: 437 Ohm
HighPow Impedance: 51 Ohm
HighPow Impedance: 67 Ohm
Implantable Lead Implant Date: 19970917
Implantable Lead Implant Date: 20070119
Implantable Lead Location: 753859
Implantable Lead Location: 753860
Implantable Lead Model: 5076
Implantable Lead Model: 6942
Implantable Pulse Generator Implant Date: 20130722
Lead Channel Impedance Value: 437 Ohm
Lead Channel Impedance Value: 456 Ohm
Lead Channel Pacing Threshold Amplitude: 0.625 V
Lead Channel Pacing Threshold Amplitude: 1.125 V
Lead Channel Pacing Threshold Pulse Width: 0.4 ms
Lead Channel Pacing Threshold Pulse Width: 0.4 ms
Lead Channel Sensing Intrinsic Amplitude: 11.125 mV
Lead Channel Sensing Intrinsic Amplitude: 11.125 mV
Lead Channel Sensing Intrinsic Amplitude: 2.625 mV
Lead Channel Sensing Intrinsic Amplitude: 2.625 mV
Lead Channel Setting Pacing Amplitude: 2.25 V
Lead Channel Setting Pacing Amplitude: 2.5 V
Lead Channel Setting Pacing Pulse Width: 0.4 ms
Lead Channel Setting Sensing Sensitivity: 0.45 mV

## 2019-04-01 ENCOUNTER — Ambulatory Visit: Payer: Medicare HMO

## 2019-04-01 ENCOUNTER — Other Ambulatory Visit: Payer: Self-pay

## 2019-04-01 DIAGNOSIS — M542 Cervicalgia: Secondary | ICD-10-CM

## 2019-04-01 DIAGNOSIS — M5412 Radiculopathy, cervical region: Secondary | ICD-10-CM

## 2019-04-01 NOTE — Therapy (Signed)
[place] [facility] [address], [place], [address] Phone: [phone]   Fax:  [phone]  Physical Therapy Treatment  Patient Details  Name: Nicole Swanson MRN: [id number] Date of Birth: July 02, 1956 No data recorded  Encounter Date: 04/01/2019  PT End of Session - 04/01/19 1658    Visit Number  7    Number of Visits  17    Date for PT Re-Evaluation  04/30/19    PT Start Time  6384    PT Stop Time  1600    PT Time Calculation (min)  45 min    Activity Tolerance  Patient tolerated treatment well    Behavior During Therapy  [facility] for tasks assessed/performed       Past Medical History:  Diagnosis Date   Aborted cardiac arrest 1994   AICD (automatic cardioverter/defibrillator) present    Allergy    Anginal pain ([place])    Arthritis    Asthma    hx of asthma related to air bag release    Dual implantable cardiac defibrillator    Medtronic initially implanted in 1997   DVT (deep vein thrombosis) in pregnancy 1986   had heparin shots followed by coumadin   Fall    10/11/17 walking dogs    Family history of seizures    Question neurological versus cardiac   Heart murmur    Irritable bowel syndrome    Lumbago due to displacement of intervertebral disc    PONV (postoperative nausea and vomiting)    Presence of permanent cardiac pacemaker    PVC's (premature ventricular contractions)    Raynaud's syndrome    Shortness of breath dyspnea    with exertion    Ventricular tachycardia ([place])     Past Surgical History:  Procedure Laterality Date   [place]   Dual chamber with pacemaker   CARPAL TUNNEL RELEASE Right    COLONOSCOPY WITH PROPOFOL N/A 01/18/2017   Procedure: COLONOSCOPY WITH PROPOFOL;  Surgeon: Manya Silvas, MD;  Location: [facility] ENDOSCOPY;  Service: Endoscopy;  Laterality: N/A;   CYSTOCELE REPAIR N/A  06/19/2016   Procedure: ANTERIOR REPAIR (CYSTOCELE) WITH VAULT PROLAPSE;  Surgeon: Bjorn Loser, MD;  Location: [facility] ORS;  Service: Urology;  Laterality: N/A;   CYSTOSCOPY N/A 06/19/2016   Procedure: CYSTOSCOPY;  Surgeon: Bjorn Loser, MD;  Location: [facility] ORS;  Service: Urology;  Laterality: N/A;   ESOPHAGOGASTRODUODENOSCOPY (EGD) WITH PROPOFOL N/A 01/18/2017   Procedure: ESOPHAGOGASTRODUODENOSCOPY (EGD) WITH PROPOFOL;  Surgeon: Manya Silvas, MD;  Location: [facility] ENDOSCOPY;  Service: Endoscopy;  Laterality: N/A;   LAPAROSCOPIC LYSIS OF ADHESIONS     NASAL SEPTUM SURGERY  2006   Nicole Swanson    OOPHORECTOMY     PACEMAKER INSERTION     5 different devices    There were no vitals filed for this visit.  Subjective Assessment - 04/01/19 1652    Subjective  Patient reports 3-4/10 pain in L side of the neck. Reports she is continuing to have some pain with rotation. Although she still did state that she had a decreased 1/10 pain for a short period after last session, it has worsened since then.    Pertinent History  Pt is a 63 year old female reporting with L sided neck and UE pain since March 2020. Patient reports having a cervical Xray this year that demonstrated "bone spurs in the cervical  spine". Had predinsone taper last month that "somewhat helped", and that patient wanted a CT scan, but insurance will not cover this until PT for 6wks. Reports pain relief following predisone taper that was short lived, and that continued NSAIDs help pain but she does not tolerate them well. Pt is retired, enjoys walking, has started wt training 2x/week. Reports pain begins on L side of the neck and travels down the LUE, but reports no more numbess/tingling sensation since predinsone tapper. Worst pain over past week 2/10, 0/10. Prior to predisone taper, reports she could not complete heavier household chores.Pt denies N/V, B&B changes, unexplained weight fluctuation, saddle paresthesia, fever, night  sweats, or unrelenting night pain at this time.    Limitations  Other (comment)    How long can you sit comfortably?  unlimited    How long can you stand comfortably?  unlimited    How long can you walk comfortably?  unlimited    Diagnostic tests  Xray    Patient Stated Goals  Maintain decreased pain without NSAIDs    Currently in Pain?  Yes    Pain Score  4     Pain Location  Neck    Pain Orientation  Left    Pain Descriptors / Indicators  Aching          Manual Therapy to reduce muscle spasms and pain response.  STM with trigger point release to UT, levator, and SCM.  Following Dry Needling: (1/1) 17mm .25 needles placed along the UT to decrease increased muscular spasms and trigger points with the patient positioned in supine. Patient was educated on risks and benefits of therapy and verbally consents to PT. (Performed by licensed therapist)      Ther-Ex to improve motor control, strength, and activity tolerance.  - Lat pulldown 35# x6 (with L shoulder pain), 25# 2x10  At [place] - Seated row 20# 3x 10 at [place] alt UE lifts 2x8  - [place] to 90 degrees 3# x10, 4# 2x10; attempted 5# decreased to 4# secondary to increase in pain at ~ 4 reps   Pt reported increased cervical rotation and reduced sx at the end of the session.    PT Education - 04/01/19 1657    Education provided  Yes    Education Details  Educated on technique/form.    Person(s) Educated  Patient    Methods  Explanation;Tactile cues;Verbal cues    Comprehension  Verbalized understanding;Returned demonstration       PT Short Term Goals - 03/05/19 1630      PT SHORT TERM GOAL #1   Title  Pt will be independent with HEP in order to improve strength and decrease back pain in order to improve pain-free function at home and work.    Time  4    Period  Weeks    Status  New        PT Long Term Goals - 03/05/19 1631      PT LONG TERM GOAL #1   Title  Pt will decrease worst neck pain as  reported on NPRS by at least 2 points in order to demonstrate clinically significant reduction in back pain.    Baseline  03/05/19 2/10 with UE numbess/tingling    Time  8    Period  Weeks    Status  New      PT LONG TERM GOAL #2   Title  Pt will demonstrate full cervical rotation, without pain to demonstrate safety with  driving    Baseline  03/05/19 R 60d L 63d with pain    Time  8    Period  Weeks    Status  New      PT LONG TERM GOAL #3   Title  Patient will increase FOTO score to 74 to demonstrate predicted increase in functional mobility to complete ADLs    Baseline  03/05/19 56    Time  8            Plan - 04/01/19 1704    Clinical Impression Statement  Pt continues to respond well to targeted manual therapy and dry needling interventions to reduce sx. Pt demonstrated increased difficulty with quadruped alternating UE, citing onset of L shoulder pain with left weight shift onto the joint (constant). Pt also needed to modify range for lat pulldowns, citing left anterior shoulder pain as well. Sx resolved with modified range and technique. Pt tolerated scaption exercise well, demonstrating good motor control and rotator cuff strength. Following exercise, pt cited increased motion with cervical rotation and overall reduced sx. Pt continues to demonstrate impaired motor control, pain response, and strength. Pt will continue to benefit from skilled therapy treatment in order to return to prior level of function.    Personal Factors and Comorbidities  Age;Comorbidity 1;Comorbidity 2;Comorbidity 3+;Sex;Past/Current Experience    Comorbidities  VTach, Raynaud's PVs, pacemaker    Examination-Activity Limitations  Lift;Reach Overhead;Sleep    Examination-Participation Restrictions  Community Activity;Yard Work;Driving    Stability/Clinical Decision Making  Evolving/Moderate complexity    Rehab Potential  Good    PT Frequency  2x / week    PT Duration  8 weeks    PT Treatment/Interventions   ADLs/Self Care Home Management;Therapeutic exercise;Electrical Stimulation;Taping;Joint Manipulations;Spinal Manipulations;Dry needling;Manual techniques;Passive range of motion;Neuromuscular re-education;Balance training;Moist Heat;Traction;Ultrasound;Cryotherapy;Therapeutic activities;Patient/family education;Functional mobility training    PT Next Visit Plan  TDN, postural restoration    PT Home Exercise Plan  UT stretch, pec doorwary stretch, Prone row, prone T    Consulted and Agree with Plan of Care  Patient       Patient will benefit from skilled therapeutic intervention in order to improve the following deficits and impairments:     Visit Diagnosis: 1. Radiculopathy, cervical region   2. Cervicalgia        Problem List Patient Active Problem List   Diagnosis Date Noted   (HFpEF) heart failure with preserved ejection fraction ([place]) 03/13/2019   High risk medication use 02/24/2019   Asthma 01/25/2019   Cervical radiculopathy at C6 01/25/2019   Chronic sinusitis of both maxillary sinuses 06/03/2018   Encounter for preventive health examination 02/09/2018   Eustachian tube disorder, left 07/08/2017   Cystocele, midline 06/19/2016   Cardiac arrest ([place]) 05/14/2016   Ventricular premature depolarization 05/14/2016   Non-dose-related adverse reaction to medication 03/13/2016   Urinary frequency 09/16/2015   Bladder prolapse, female, acquired 09/08/2015   Other intervertebral disc degeneration, lumbar region 01/25/2015   Sacroiliac joint dysfunction of both sides 01/25/2015   Spinal stenosis, lumbar region, with neurogenic claudication 01/25/2015   Facet syndrome, lumbar 01/25/2015   Lumbar radiculopathy 01/25/2015   Calculus of gallbladder without cholecystitis without obstruction 12/23/2014   Polyarthritis 03/16/2014   Thrombocytopenia ([place]) 09/11/2013   Other malaise 09/10/2013   Major depressive disorder, single episode 09/10/2013   Raynaud  phenomenon 04/17/2013   Raynaud's syndrome without gangrene 04/17/2013   Low back pain 12/05/2012   Shortness of breath 06/16/2012   Aborted cardiac arrest  ICD (implantable cardioverter-defibrillator) in place    PVC's / ventricular tachycardia    Lumbago due to displacement of intervertebral disc    Irritable bowel syndrome with constipation     Scarlette Calico, SPT 04/01/2019, 5:07 PM  [place] [facility] [address] [place], [address] Phone: [phone]   Fax:  [phone]  Name: ANISA LEANOS MRN: [id number] Date of Birth: 03-Jul-1956

## 2019-04-03 ENCOUNTER — Ambulatory Visit: Payer: Medicare HMO | Admitting: Physical Therapy

## 2019-04-13 ENCOUNTER — Ambulatory Visit (INDEPENDENT_AMBULATORY_CARE_PROVIDER_SITE_OTHER): Payer: Medicare HMO

## 2019-04-13 ENCOUNTER — Encounter: Payer: Self-pay | Admitting: Cardiology

## 2019-04-13 DIAGNOSIS — I5030 Unspecified diastolic (congestive) heart failure: Secondary | ICD-10-CM

## 2019-04-13 DIAGNOSIS — Z95810 Presence of automatic (implantable) cardiac defibrillator: Secondary | ICD-10-CM | POA: Diagnosis not present

## 2019-04-13 NOTE — Progress Notes (Signed)
Remote ICD transmission.   

## 2019-04-15 NOTE — Progress Notes (Signed)
EPIC Encounter for ICM Monitoring  Patient Name: Nicole Swanson is a 63 y.o. female Date: 04/15/2019 Primary Care Physican: [name], MD Primary Cardiologist: [name] Electrophysiologist: [name] 03/11/2019 Weight: 150.5 - 151 lbs (weighs twice a week)                                                           Heart Failure questions reviewed.  Pt asymptomatic.   She thinks she heard an alert sound coming from the device.  It happened one time and has not repeated since then.  The battery is at 2.64 and replacement is 2.63.  Advised an alert will be sent to the device clinic when the battery reaches the replacement time.     Optivol thoracic impedance normal but was suggestive of fluid accumulation from 7/31 - 8/8.   Prescribed: Furosemide 20 mg Take 1 tablet (20 mg) by mouth 2-3 times per week as needed for increased shortness of breath/swelling.  She has been taking Furosemide 2-3 times a week.  Labs: 02/24/2019 Creatinine 0.85, BUN 16, Potassium 4.5, Sodium 141, GFR 72-83 A complete set of results can be found in Results Review.  Recommendations:  Encouraged to call if experiencing fluid symptoms.  She said if next month fluid levels are stable then she would prefer to be monitored every 3 months.   Follow-up plan: ICM clinic phone appointment on 05/18/2019.       Copy of ICM check sent to Dr. [name].   3 month ICM trend: 04/13/2019    1 Year ICM trend:            [name], RN 04/15/2019 11:58 AM

## 2019-05-06 ENCOUNTER — Ambulatory Visit: Payer: Medicare HMO | Admitting: Internal Medicine

## 2019-05-08 ENCOUNTER — Other Ambulatory Visit: Payer: Self-pay

## 2019-05-08 ENCOUNTER — Ambulatory Visit (INDEPENDENT_AMBULATORY_CARE_PROVIDER_SITE_OTHER): Payer: Medicare HMO

## 2019-05-08 DIAGNOSIS — Z23 Encounter for immunization: Secondary | ICD-10-CM | POA: Diagnosis not present

## 2019-05-14 ENCOUNTER — Other Ambulatory Visit: Payer: Self-pay | Admitting: Internal Medicine

## 2019-05-14 DIAGNOSIS — R0600 Dyspnea, unspecified: Secondary | ICD-10-CM | POA: Diagnosis not present

## 2019-05-14 DIAGNOSIS — J439 Emphysema, unspecified: Secondary | ICD-10-CM | POA: Diagnosis not present

## 2019-05-14 DIAGNOSIS — Z1231 Encounter for screening mammogram for malignant neoplasm of breast: Secondary | ICD-10-CM

## 2019-05-18 ENCOUNTER — Ambulatory Visit (INDEPENDENT_AMBULATORY_CARE_PROVIDER_SITE_OTHER): Payer: Medicare HMO

## 2019-05-18 DIAGNOSIS — H04123 Dry eye syndrome of bilateral lacrimal glands: Secondary | ICD-10-CM | POA: Diagnosis not present

## 2019-05-18 DIAGNOSIS — I5030 Unspecified diastolic (congestive) heart failure: Secondary | ICD-10-CM | POA: Diagnosis not present

## 2019-05-18 DIAGNOSIS — Z95810 Presence of automatic (implantable) cardiac defibrillator: Secondary | ICD-10-CM | POA: Diagnosis not present

## 2019-05-19 ENCOUNTER — Other Ambulatory Visit: Payer: Self-pay

## 2019-05-19 ENCOUNTER — Encounter: Payer: Self-pay | Admitting: Internal Medicine

## 2019-05-19 ENCOUNTER — Ambulatory Visit (INDEPENDENT_AMBULATORY_CARE_PROVIDER_SITE_OTHER): Payer: Medicare HMO | Admitting: Internal Medicine

## 2019-05-19 VITALS — Ht 67.0 in | Wt 151.0 lb

## 2019-05-19 DIAGNOSIS — M5000 Cervical disc disorder with myelopathy, unspecified cervical region: Secondary | ICD-10-CM

## 2019-05-19 DIAGNOSIS — M4722 Other spondylosis with radiculopathy, cervical region: Secondary | ICD-10-CM

## 2019-05-19 MED ORDER — PREDNISONE 10 MG PO TABS: ORAL_TABLET | ORAL | 0 refills | Status: DC

## 2019-05-19 MED ORDER — GABAPENTIN 100 MG PO CAPS: 100.0000 mg | ORAL_CAPSULE | Freq: Three times a day (TID) | ORAL | 3 refills | Status: AC

## 2019-05-19 MED ORDER — TRAMADOL HCL 50 MG PO TABS: 50.0000 mg | ORAL_TABLET | Freq: Four times a day (QID) | ORAL | 0 refills | Status: AC | PRN

## 2019-05-19 NOTE — Progress Notes (Signed)
Virtual Visit via [email or web address]  This visit type was conducted due to national recommendations for restrictions regarding the COVID-19 pandemic (e.g. social distancing).  This format is felt to be most appropriate for this patient at this time.  All issues noted in this document were discussed and addressed.  No physical exam was performed (except for noted visual exam findings with Video Visits).   I connected with@ on 05/19/19 at  3:30 PM EDT by a video enabled telemedicine application or telephone and verified that I am speaking with the correct person using two identifiers. Location patient: home Location provider: work or home office Persons participating in the virtual visit: patient, provider  I discussed the limitations, risks, security and privacy concerns of performing an evaluation and management service by telephone and the availability of in person appointments. I also discussed with the patient that there may be a patient responsible charge related to this service. The patient expressed understanding and agreed to proceed.  Reason for visit: persistent pain   HPI:  63 yr old female with history of lumbar  spinal stenosis presents for follow up on persistent neck pain with radiculopathy that has now failed conservative treatment for suspected cervical spine stenosis with 6 week trial of  PT and NSAIDs since late May,  No improvement,  Left arm feels weak and pain radiates to hand  Also feeling her left leg is weak, an starting to have balance issues when standing. Waking u p in pain when turns over in bed    ROS: See pertinent positives and negatives per HPI.  Past Medical History:  Diagnosis Date  . Aborted cardiac arrest 1994  . AICD (automatic cardioverter/defibrillator) present   . Allergy   . Anginal pain ([place])   . Arthritis   . Asthma    hx of asthma related to air bag release   . Dual implantable cardiac defibrillator    Medtronic initially implanted in 1997  . DVT (deep  vein thrombosis) in pregnancy 1986   had heparin shots followed by coumadin  . Fall    10/11/17 walking dogs   . Family history of seizures    Question neurological versus cardiac  . Heart murmur   . Irritable bowel syndrome   . 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 PONV (postoperative nausea and vomiting)   . Presence of permanent cardiac pacemaker   . PVC's (premature ventricular contractions)   . Raynaud's syndrome   . Shortness of breath dyspnea    with exertion   . Ventricular tachycardia [facility])     Past Surgical History:  Procedure Laterality Date  . ABDOMINAL HYSTERECTOMY    . APPENDECTOMY    . CARDIAC DEFIBRILLATOR PLACEMENT  1994   Dual chamber with pacemaker  . CARPAL TUNNEL RELEASE Right   . COLONOSCOPY WITH PROPOFOL N/A 01/18/2017   Procedure: COLONOSCOPY WITH PROPOFOL;  Surgeon: Manya Silvas, MD;  Location: [facility] ENDOSCOPY;  Service: Endoscopy;  Laterality: N/A;  . CYSTOCELE REPAIR N/A 06/19/2016   Procedure: ANTERIOR REPAIR (CYSTOCELE) WITH VAULT PROLAPSE;  Surgeon: Bjorn Loser, MD;  Location: [facility] ORS;  Service: Urology;  Laterality: N/A;  . CYSTOSCOPY N/A 06/19/2016   Procedure: CYSTOSCOPY;  Surgeon: Bjorn Loser, MD;  Location: [facility] ORS;  Service: Urology;  Laterality: N/A;  . ESOPHAGOGASTRODUODENOSCOPY (EGD) WITH PROPOFOL N/A 01/18/2017   Procedure: ESOPHAGOGASTRODUODENOSCOPY (EGD) WITH PROPOFOL;  Surgeon: Manya Silvas, MD;  Location: [facility] ENDOSCOPY;  Service: Endoscopy;  Laterality: N/A;  . LAPAROSCOPIC LYSIS  OF ADHESIONS    . NASAL SEPTUM SURGERY  [address]   . OOPHORECTOMY    . PACEMAKER INSERTION     5 different devices    Family History  Problem Relation Age of Onset  . Heart disease Mother 48       A Fib, CHF  . Stroke Mother   . Heart disease Father   . Stroke Father   . Seizures Brother   . Seizures Daughter     SOCIAL HX: reports that she quit smoking about 23 years ago. Her smoking use included  cigarettes. She has never used smokeless tobacco. She reports current alcohol use. She reports that she does not use drugs.  Current Outpatient Medications:  .  ANORO ELLIPTA 62.5-25 MCG/INH AEPB, INL 1 PUFF ITL QD, Disp: , Rfl:  .  cholecalciferol (VITAMIN D) 1000 units tablet, Take 2,000 Units by mouth daily. , Disp: , Rfl:  .  Cranberry 1000 MG CAPS, Take by mouth., Disp: , Rfl:  .  dicyclomine (BENTYL) 20 MG tablet, Take 1 tablet (20 mg total) by mouth 3 (three) times daily as needed for spasms., Disp: 30 tablet, Rfl: 1 .  docusate sodium (COLACE) 250 MG capsule, Take 250 mg by mouth 2 (two) times daily., Disp: , Rfl:  .  doxycycline (VIBRA-TABS) 100 MG tablet, Take 100 mg by mouth 2 (two) times daily. , Disp: , Rfl:  .  etodolac (LODINE) 500 MG tablet, Take 1 tablet (500 mg total) by mouth 2 (two) times daily., Disp: 60 tablet, Rfl: 2 .  famotidine (PEPCID) 20 MG tablet, Take 1 tablet (20 mg total) by mouth 2 (two) times daily., Disp: 60 tablet, Rfl: 2 .  fluticasone (FLONASE) 50 MCG/ACT nasal spray, Place 1 spray into both nostrils daily., Disp: 16 g, Rfl: 5 .  furosemide (LASIX) 20 MG tablet, Take 1 tablet (20 mg) by mouth 2-3 times per week as needed for increased shortness of breath/ swelling, Disp: 30 tablet, Rfl: 6 .  loratadine (CLARITIN) 10 MG tablet, Take 10 mg by mouth daily., Disp: , Rfl:  .  montelukast (SINGULAIR) 10 MG tablet, Take 1 tablet (10 mg total) by mouth at bedtime., Disp: 90 tablet, Rfl: 1 .  neomycin-polymyxin b-dexamethasone (MAXITROL) 3.5-10000-0.1 OINT, , Disp: , Rfl:  .  omeprazole (PRILOSEC) 40 MG capsule, Take 1 capsule (40 mg total) by mouth 2 (two) times daily., Disp: 180 capsule, Rfl: 1 .  ondansetron (ZOFRAN) 4 MG tablet, Take 1 tablet (4 mg total) by mouth every 8 (eight) hours as needed for nausea or vomiting., Disp: 30 tablet, Rfl: 0 .  senna (SENOKOT) 8.6 MG tablet, Take 1 tablet by mouth as needed for constipation., Disp: , Rfl:  .  sotalol (BETAPACE)  120 MG tablet, Take 1 tablet (120 mg total) by mouth 2 (two) times daily., Disp: 180 tablet, Rfl: 1 .  VENTOLIN HFA 108 (90 Base) MCG/ACT inhaler, , Disp: , Rfl:  .  gabapentin (NEURONTIN) 100 MG capsule, Take 1 capsule (100 mg total) by mouth 3 (three) times daily., Disp: 90 capsule, Rfl: 3 .  predniSONE (DELTASONE) 10 MG tablet, 6 tablets daily for 3 days,  then reduce by 1 tablet daily until gone, Disp: 33 tablet, Rfl: 0 .  traMADol (ULTRAM) 50 MG tablet, Take 1 tablet (50 mg total) by mouth every 6 (six) hours as needed for up to 7 days for severe pain., Disp: 28 tablet, Rfl: 0  EXAM:  VITALS per patient  if applicable:  GENERAL: alert, oriented, appears well and in no acute distress  HEENT: atraumatic, conjunttiva clear, no obvious abnormalities on inspection of external nose and ears  NECK: normal movements of the head and neck  LUNGS: on inspection no signs of respiratory distress, breathing rate appears normal, no obvious gross SOB, gasping or wheezing  CV: no obvious cyanosis  MS: moves all visible extremities without noticeable abnormality  PSYCH/NEURO: pleasant and cooperative, no obvious depression or anxiety, speech and thought processing grossly intact  ASSESSMENT AND PLAN:  Discussed the following assessment and plan:  Cervical radiculopathy due to degenerative joint disease of spine - Plan: CT CERVICAL SPINE WO CONTRAST  Intervertebral cervical disc disorder with myelopathy, cervical region  Intervertebral cervical disc disorder with myelopathy, cervical region Her symptoms have progressed despite conservative treatment with PT and NSAIDs.  CT cervical spine ordered to determine if there is a need for surgical decompression.  For pain management,  Tramadol, gabapentin prednisone taper prescribed    I discussed the assessment and treatment plan with the patient. The patient was provided an opportunity to ask questions and all were answered. The patient agreed with  the plan and demonstrated an understanding of the instructions.   The patient was advised to call back or seek an in-person evaluation if the symptoms worsen or if the condition fails to improve as anticipated.  I provided  25 minutes of non-face-to-face time during this encounter reviewing prior films  And physical therapy , discussing pain management , and counselling .   [name], MD

## 2019-05-19 NOTE — Progress Notes (Signed)
EPIC Encounter for ICM Monitoring  Patient Name: Nicole Swanson is a 63 y.o. female Date: 05/19/2019 Primary Care Physican: [name], MD Primary Cardiologist:Klein Electrophysiologist:Klein 7/8/2020Weight: 150.5 - 151lbs (weighs twice a week) 05/19/2019 Weight: 153 lbs  Battery 2.63   Heart Failure questions reviewed. Pt asymptomatic.     Optivol thoracic impedance trending close to baseline normal.   Prescribed: Furosemide20 mgTake 1 tablet (20 mg) by mouth 2-3 times per week as needed for increased shortness of breath/swelling. She has been taking Furosemide 2-3 times a week.  Labs: 02/24/2019 Creatinine0.85, BUN16, Potassium4.5, Sodium141, [id number] A complete set of results can be found in Results Review.  Recommendations: No changes and encouraged to call if experiencing any fluid symptoms.  Follow-up plan: ICM clinic phone appointment on 07/01/2019.   91 day device clinic remote transmission 06/30/2019.      Copy of ICM check sent to Dr. [name].   3 month ICM trend: 05/18/2019    1 Year ICM trend:       [name], RN 05/19/2019 8:35 AM

## 2019-05-19 NOTE — Patient Instructions (Signed)
  I am prescribing gabapentin to help with your neck pain.  You can start with 100 mg at bedtime  and either increase the dose at bedtime up to 300 mg gradually OR:  you can add a morning and afternoon dose if needed  If you tolerate this medication and find that it helps your pain , we can increase the dose gradually as needed , up to 2400 mg daily    The most common side effects are fluid retention and dizziness.     Start the prednisone on Thursday or Friday.  Use the etodolac UNTIL you start the prednisone, then suspend etodolac until 2 days after you finish the prednisone  Tramadol can be taken every 6 hours if needed.  Let me know how often you have needed it so I can refill in one week   You can add up to 2000 mg of acetominophen (tylenol) every day safely  In divided doses (500 mg every 6 hours  Or 1000 mg every 12 hours.)   CT cervical spine reordered

## 2019-05-20 NOTE — Assessment & Plan Note (Signed)
Her symptoms have progressed despite conservative treatment with PT and NSAIDs.  CT cervical spine ordered to determine if there is a need for surgical decompression.  For pain management,  Tramadol, gabapentin prednisone taper prescribed

## 2019-05-26 ENCOUNTER — Other Ambulatory Visit: Payer: Self-pay | Admitting: *Deleted

## 2019-05-26 MED ORDER — FUROSEMIDE 20 MG PO TABS: ORAL_TABLET | ORAL | 0 refills | Status: DC

## 2019-06-15 ENCOUNTER — Ambulatory Visit: Payer: Medicare HMO

## 2019-06-20 ENCOUNTER — Other Ambulatory Visit: Payer: Self-pay | Admitting: Internal Medicine

## 2019-06-26 ENCOUNTER — Ambulatory Visit: Payer: Medicare HMO | Admitting: Internal Medicine

## 2019-06-30 ENCOUNTER — Other Ambulatory Visit: Payer: Self-pay

## 2019-06-30 ENCOUNTER — Ambulatory Visit (INDEPENDENT_AMBULATORY_CARE_PROVIDER_SITE_OTHER): Payer: Medicare HMO | Admitting: *Deleted

## 2019-06-30 ENCOUNTER — Ambulatory Visit
Admission: RE | Admit: 2019-06-30 | Discharge: 2019-06-30 | Disposition: A | Discharge status: Home / Self Care | Payer: Medicare HMO | Source: Ambulatory Visit | Attending: Internal Medicine | Admitting: Internal Medicine

## 2019-06-30 DIAGNOSIS — M4722 Other spondylosis with radiculopathy, cervical region: Secondary | ICD-10-CM | POA: Insufficient documentation

## 2019-06-30 DIAGNOSIS — I469 Cardiac arrest, cause unspecified: Secondary | ICD-10-CM

## 2019-06-30 DIAGNOSIS — M542 Cervicalgia: Secondary | ICD-10-CM | POA: Diagnosis not present

## 2019-07-01 ENCOUNTER — Ambulatory Visit (INDEPENDENT_AMBULATORY_CARE_PROVIDER_SITE_OTHER): Payer: Medicare HMO

## 2019-07-01 DIAGNOSIS — Z95810 Presence of automatic (implantable) cardiac defibrillator: Secondary | ICD-10-CM

## 2019-07-01 DIAGNOSIS — I5030 Unspecified diastolic (congestive) heart failure: Secondary | ICD-10-CM | POA: Diagnosis not present

## 2019-07-01 DIAGNOSIS — M5000 Cervical disc disorder with myelopathy, unspecified cervical region: Secondary | ICD-10-CM

## 2019-07-01 LAB — CUP PACEART REMOTE DEVICE CHECK
Battery Voltage: 2.63 V
Brady Statistic AP VP Percent: 0.03 %
Brady Statistic AP VS Percent: 67.6 %
Brady Statistic AS VP Percent: 0.02 %
Brady Statistic AS VS Percent: 32.35 %
Brady Statistic RA Percent Paced: 66.31 %
Brady Statistic RV Percent Paced: 0.05 %
Date Time Interrogation Session: 20201027052204
HighPow Impedance: 494 Ohm
HighPow Impedance: 53 Ohm
HighPow Impedance: 76 Ohm
Implantable Lead Implant Date: 19970917
Implantable Lead Implant Date: 20070119
Implantable Lead Location: 753859
Implantable Lead Location: 753860
Implantable Lead Model: 5076
Implantable Lead Model: 6942
Implantable Pulse Generator Implant Date: 20130722
Lead Channel Impedance Value: 456 Ohm
Lead Channel Impedance Value: 494 Ohm
Lead Channel Pacing Threshold Amplitude: 0.625 V
Lead Channel Pacing Threshold Amplitude: 0.875 V
Lead Channel Pacing Threshold Pulse Width: 0.4 ms
Lead Channel Pacing Threshold Pulse Width: 0.4 ms
Lead Channel Sensing Intrinsic Amplitude: 12 mV
Lead Channel Sensing Intrinsic Amplitude: 12 mV
Lead Channel Sensing Intrinsic Amplitude: 2.125 mV
Lead Channel Sensing Intrinsic Amplitude: 2.125 mV
Lead Channel Setting Pacing Amplitude: 2 V
Lead Channel Setting Pacing Amplitude: 2.5 V
Lead Channel Setting Pacing Pulse Width: 0.4 ms
Lead Channel Setting Sensing Sensitivity: 0.45 mV

## 2019-07-03 NOTE — Progress Notes (Signed)
EPIC Encounter for ICM Monitoring  Patient Name: Nicole Swanson is a 63 y.o. female Date: 07/03/2019 Primary Care Physican: [name], MD Primary Cardiologist:Klein Electrophysiologist:Klein 05/19/2019 Weight: 153 lbs  Battery 2.63   Heart Failure questions reviewed. Pt asymptomatic.She is having cervical pain and taking meds to help alleviate pain.  Optivol thoracic impedance trending close to baseline normal.  Prescribed: Furosemide20 mgTake 1 tablet (20 mg) by mouth 2-3 times per week as needed for increased shortness of breath/swelling. She has been taking Furosemide 2-3 times a week.  Labs: 02/24/2019 Creatinine0.85, BUN16, Potassium4.5, Sodium141, [id number] A complete set of results can be found in Results Review.  Recommendations: No changes and encouraged to call if experiencing any fluid symptoms.  Follow-up plan: ICM clinic phone appointment on 08/04/2019.   91 day device clinic remote transmission 09/28/2018.  Office appt 08/11/2019 with Dr. [name].    Copy of ICM check sent to Dr. [name].   3 month ICM trend: 06/30/2019    1 Year ICM trend:       [name], RN 07/03/2019 2:34 PM

## 2019-07-05 ENCOUNTER — Other Ambulatory Visit: Payer: Self-pay | Admitting: Internal Medicine

## 2019-07-06 ENCOUNTER — Other Ambulatory Visit: Payer: Self-pay | Admitting: Internal Medicine

## 2019-07-06 MED ORDER — PREDNISONE 10 MG PO TABS: ORAL_TABLET | ORAL | 0 refills | Status: DC

## 2019-07-09 ENCOUNTER — Other Ambulatory Visit: Payer: Self-pay

## 2019-07-09 ENCOUNTER — Ambulatory Visit
Admission: RE | Admit: 2019-07-09 | Discharge: 2019-07-09 | Disposition: A | Discharge status: Home / Self Care | Payer: Medicare HMO | Source: Ambulatory Visit | Attending: Internal Medicine | Admitting: Internal Medicine

## 2019-07-09 DIAGNOSIS — Z1231 Encounter for screening mammogram for malignant neoplasm of breast: Secondary | ICD-10-CM | POA: Diagnosis not present

## 2019-07-22 DIAGNOSIS — M5412 Radiculopathy, cervical region: Secondary | ICD-10-CM | POA: Diagnosis not present

## 2019-07-22 DIAGNOSIS — M5416 Radiculopathy, lumbar region: Secondary | ICD-10-CM | POA: Diagnosis not present

## 2019-07-22 NOTE — Addendum Note (Signed)
Addended by: Douglass Rivers D on: 07/22/2019 11:13 AM   Modules accepted: Level of Service

## 2019-07-22 NOTE — Progress Notes (Signed)
Remote ICD transmission.   

## 2019-08-03 ENCOUNTER — Ambulatory Visit (INDEPENDENT_AMBULATORY_CARE_PROVIDER_SITE_OTHER): Payer: Medicare HMO | Admitting: *Deleted

## 2019-08-03 DIAGNOSIS — I469 Cardiac arrest, cause unspecified: Secondary | ICD-10-CM | POA: Diagnosis not present

## 2019-08-03 LAB — CUP PACEART REMOTE DEVICE CHECK
Battery Voltage: 2.62 V
Brady Statistic AP VP Percent: 0.03 %
Brady Statistic AP VS Percent: 77.46 %
Brady Statistic AS VP Percent: 0.02 %
Brady Statistic AS VS Percent: 22.48 %
Brady Statistic RA Percent Paced: 75.15 %
Brady Statistic RV Percent Paced: 0.05 %
Date Time Interrogation Session: 20201130012504
HighPow Impedance: 48 Ohm
HighPow Impedance: 494 Ohm
HighPow Impedance: 73 Ohm
Implantable Lead Implant Date: 19970917
Implantable Lead Implant Date: 20070119
Implantable Lead Location: 753859
Implantable Lead Location: 753860
Implantable Lead Model: 5076
Implantable Lead Model: 6942
Implantable Pulse Generator Implant Date: 20130722
Lead Channel Impedance Value: 456 Ohm
Lead Channel Impedance Value: 456 Ohm
Lead Channel Pacing Threshold Amplitude: 0.625 V
Lead Channel Pacing Threshold Amplitude: 0.75 V
Lead Channel Pacing Threshold Pulse Width: 0.4 ms
Lead Channel Pacing Threshold Pulse Width: 0.4 ms
Lead Channel Sensing Intrinsic Amplitude: 1.875 mV
Lead Channel Sensing Intrinsic Amplitude: 1.875 mV
Lead Channel Sensing Intrinsic Amplitude: 10.125 mV
Lead Channel Sensing Intrinsic Amplitude: 10.125 mV
Lead Channel Setting Pacing Amplitude: 2 V
Lead Channel Setting Pacing Amplitude: 2.5 V
Lead Channel Setting Pacing Pulse Width: 0.4 ms
Lead Channel Setting Sensing Sensitivity: 0.45 mV

## 2019-08-04 ENCOUNTER — Ambulatory Visit (INDEPENDENT_AMBULATORY_CARE_PROVIDER_SITE_OTHER): Payer: Medicare HMO

## 2019-08-04 DIAGNOSIS — Z95810 Presence of automatic (implantable) cardiac defibrillator: Secondary | ICD-10-CM | POA: Diagnosis not present

## 2019-08-04 DIAGNOSIS — I5030 Unspecified diastolic (congestive) heart failure: Secondary | ICD-10-CM | POA: Diagnosis not present

## 2019-08-05 ENCOUNTER — Other Ambulatory Visit: Payer: Self-pay | Admitting: Neurosurgery

## 2019-08-05 DIAGNOSIS — M5416 Radiculopathy, lumbar region: Secondary | ICD-10-CM

## 2019-08-07 NOTE — Progress Notes (Signed)
EPIC Encounter for ICM Monitoring  Patient Name: Nicole Swanson is a 63 y.o. female Date: 08/07/2019 Primary Care Physican: [name], MD Primary Cardiologist:Klein Electrophysiologist:Klein 08/07/2019 Weight: 153-155 lbs  Battery 2.62   Heart Failure questions reviewed. Pt asymptomatic for fluid accumulation.  Optivol thoracic impedancenormal.  Prescribed: Furosemide20 mgTake 1 tablet (20 mg) by mouth 2-3 times per week as needed for increased shortness of breath/swelling. She has been taking Furosemide 2-3 times a week.  Labs: 02/24/2019 Creatinine0.85, BUN16, Potassium4.5, Sodium141, [id number] A complete set of results can be found in Results Review.  Recommendations:  No changes and encouraged to call if experiencing any fluid symptoms.  Follow-up plan: ICM clinic phone appointment on 09/07/2019.   91 day device clinic remote transmission 09/29/2019.  Office appt 08/11/2019 with Dr. [name].    Copy of ICM check sent to Dr. [name].   3 month ICM trend: 08/03/2019    1 Year ICM trend:       [name], RN 08/07/2019 8:57 AM

## 2019-08-11 ENCOUNTER — Other Ambulatory Visit: Payer: Self-pay

## 2019-08-11 ENCOUNTER — Encounter: Payer: Self-pay | Admitting: Internal Medicine

## 2019-08-11 ENCOUNTER — Ambulatory Visit (INDEPENDENT_AMBULATORY_CARE_PROVIDER_SITE_OTHER): Payer: Medicare HMO | Admitting: Internal Medicine

## 2019-08-11 VITALS — BP 138/84 | HR 64 | Ht 67.0 in | Wt 156.2 lb

## 2019-08-11 DIAGNOSIS — Z79899 Other long term (current) drug therapy: Secondary | ICD-10-CM | POA: Diagnosis not present

## 2019-08-11 DIAGNOSIS — Z95810 Presence of automatic (implantable) cardiac defibrillator: Secondary | ICD-10-CM

## 2019-08-11 DIAGNOSIS — R0602 Shortness of breath: Secondary | ICD-10-CM | POA: Diagnosis not present

## 2019-08-11 DIAGNOSIS — I493 Ventricular premature depolarization: Secondary | ICD-10-CM | POA: Diagnosis not present

## 2019-08-11 DIAGNOSIS — I469 Cardiac arrest, cause unspecified: Secondary | ICD-10-CM

## 2019-08-11 NOTE — Patient Instructions (Addendum)
Medication Instructions:  - Your physician recommends that you continue on your current medications as directed. Please refer to the Current Medication list given to you today.  *If you need a refill on your cardiac medications before your next appointment, please call your pharmacy*  Lab Work: - none ordered  If you have labs (blood work) drawn today and your tests are completely normal, you will receive your results only by: Marland Kitchen MyChart Message (if you have MyChart) OR . A paper copy in the mail If you have any lab test that is abnormal or we need to change your treatment, we will call you to review the results.  Testing/Procedures: Your physician has recommended that you have a cardiopulmonary stress test (CPX). CPX testing is a non-invasive measurement of heart and lung function. It replaces a traditional treadmill stress test. This type of test provides a tremendous amount of information that relates not only to your present condition but also for future outcomes. This test combines measurements of you ventilation, respiratory gas exchange in the lungs, electrocardiogram (EKG), blood pressure and physical response before, during, and following an exercise protocol.   Follow-Up: At [facility], you and your health needs are our priority.  As part of our continuing mission to provide you with exceptional heart care, we have created designated Provider Care Teams.  These Care Teams include your primary Cardiologist (physician) and Advanced Practice Providers (APPs -  Physician Assistants and Nurse Practitioners) who all work together to provide you with the care you need, when you need it.  Your next appointment:   2 month(s)  The format for your next appointment:   Virtual Visit   Provider:   Virl Axe, MD  Other Instructions n/a

## 2019-08-11 NOTE — Progress Notes (Signed)
Patient Care Team: [name], MD as PCP - General (Internal Medicine)   HPI  Nicole Swanson is a 63 y.o. female Seen in followup for ICD implanted for aborted cardiac arrest. Her care initially had been at [facility]. She is status post dual chamber ICD implantation and generator replacement was recently done by me summer 2013   Echocardiogram 2011 demonstrated normal left ventricular function valve function and dimension sizes   She also has a history of ventricular ectopy for which she has been on sotalol in which Dr. Sharon Seller was going to increase prior to her transferring her care to [facility]   Her husband died 65 .     When returned from [place], progressive issues with DOE, compromising ADLs; hx of asthma.  Optivol was abnormal  Diuretic trial assoc with marked improvement in optivol  Saw Pulm "modCOPD " started anoro and prn albuterol   She is extremely discouraged related to her dyspnea.  Going back through the care everywhere note, she has COPD listed back as far as 2015 and perhaps further.  Imaging had suggested emphysema.  PFTs were also reviewed from Dr. Gust Brooms note from 11/19 demonstrating "obstruction "the specific data were not available.  She even asked the question as to whether she should replace her defibrillator as bad as she feels.  She acknowledges depression.  She struggled with it previously when her husband was very ill.  Therapy was quite effective.  She is isolated with Covid.  She has 2 children but has not see them   DATE TEST EF   10/17    Myoview  65 % No ischemia  10/17 Echo  65%   5/20 Echo  55-60% E/E' <10     Date Cr Mg K  4/15  0.9 2.4    10/15  0.9   4.5  11/17 0.7  3.9  2/18 0.64  4.0  5/18 0.79 2.5 4.5  11/18 0.92  4.3  6/19 0.87 2.5 4.1  6/20 0.85  4.5   Device History: MDT dual chamber ICD implanted 1997 for aborted cardiac arrest; gen change 2007; gen change 2013 History of appropriate therapy: Yes History of AAD  therapy: Yes - sotalol    Past Medical History:  Diagnosis Date  . Aborted cardiac arrest 1994  . AICD (automatic cardioverter/defibrillator) present   . Allergy   . Anginal pain ([place])   . Arthritis   . Asthma    hx of asthma related to air bag release   . Dual implantable cardiac defibrillator    Medtronic initially implanted in 1997  . DVT (deep vein thrombosis) in pregnancy 1986   had heparin shots followed by coumadin  . Fall    10/11/17 walking dogs   . Family history of seizures    Question neurological versus cardiac  . Heart murmur   . Irritable bowel syndrome   . 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 PONV (postoperative nausea and vomiting)   . Presence of permanent cardiac pacemaker   . PVC's (premature ventricular contractions)   . Raynaud's syndrome   . Shortness of breath dyspnea    with exertion   . Ventricular tachycardia [facility])     Past Surgical History:  Procedure Laterality Date  . ABDOMINAL HYSTERECTOMY    . APPENDECTOMY    . CARDIAC DEFIBRILLATOR PLACEMENT  1994   Dual chamber with pacemaker  . CARPAL TUNNEL RELEASE Right   . COLONOSCOPY WITH  PROPOFOL N/A 01/18/2017   Procedure: COLONOSCOPY WITH PROPOFOL;  Surgeon: Manya Silvas, MD;  Location: [facility] ENDOSCOPY;  Service: Endoscopy;  Laterality: N/A;  . CYSTOCELE REPAIR N/A 06/19/2016   Procedure: ANTERIOR REPAIR (CYSTOCELE) WITH VAULT PROLAPSE;  Surgeon: Bjorn Loser, MD;  Location: [facility] ORS;  Service: Urology;  Laterality: N/A;  . CYSTOSCOPY N/A 06/19/2016   Procedure: CYSTOSCOPY;  Surgeon: Bjorn Loser, MD;  Location: [facility] ORS;  Service: Urology;  Laterality: N/A;  . ESOPHAGOGASTRODUODENOSCOPY (EGD) WITH PROPOFOL N/A 01/18/2017   Procedure: ESOPHAGOGASTRODUODENOSCOPY (EGD) WITH PROPOFOL;  Surgeon: Manya Silvas, MD;  Location: [facility] ENDOSCOPY;  Service: Endoscopy;  Laterality: N/A;  . LAPAROSCOPIC LYSIS OF ADHESIONS    . NASAL SEPTUM SURGERY  [address]   .  OOPHORECTOMY    . PACEMAKER INSERTION     5 different devices    Current Outpatient Medications  Medication Sig Dispense Refill  . ANORO ELLIPTA 62.5-25 MCG/INH AEPB INL 1 PUFF ITL QD    . cholecalciferol (VITAMIN D) 1000 units tablet Take 2,000 Units by mouth daily.     . Cranberry 1000 MG CAPS Take by mouth.    . dicyclomine (BENTYL) 20 MG tablet Take 1 tablet (20 mg total) by mouth 3 (three) times daily as needed for spasms. 30 tablet 1  . docusate sodium (COLACE) 250 MG capsule Take 250 mg by mouth 2 (two) times daily.    Marland Kitchen etodolac (LODINE) 500 MG tablet Take 1 tablet (500 mg total) by mouth 2 (two) times daily. (Patient taking differently: Take 500 mg by mouth as needed. ) 60 tablet 2  . famotidine (PEPCID) 20 MG tablet Take 1 tablet (20 mg total) by mouth 2 (two) times daily. (Patient taking differently: Take 20 mg by mouth as needed. ) 60 tablet 2  . fluticasone (FLONASE) 50 MCG/ACT nasal spray Place 1 spray into both nostrils daily. 16 g 5  . furosemide (LASIX) 20 MG tablet Take 1 tablet (20 mg) by mouth 2-3 times per week as needed for increased shortness of breath/ swelling (Patient taking differently: as needed. Take 1 tablet (20 mg) by mouth 2-3 times per week as needed for increased shortness of breath/ swelling) 90 tablet 0  . gabapentin (NEURONTIN) 100 MG capsule Take 1 capsule (100 mg total) by mouth 3 (three) times daily. (Patient taking differently: Take 100 mg by mouth as needed. ) 90 capsule 3  . loratadine (CLARITIN) 10 MG tablet Take 10 mg by mouth daily.    . montelukast (SINGULAIR) 10 MG tablet Take 1 tablet (10 mg total) by mouth at bedtime. 90 tablet 1  . omeprazole (PRILOSEC) 40 MG capsule Take 1 capsule (40 mg total) by mouth 2 (two) times daily. 180 capsule 1  . ondansetron (ZOFRAN) 4 MG tablet Take 1 tablet (4 mg total) by mouth every 8 (eight) hours as needed for nausea or vomiting. 30 tablet 0  . senna (SENOKOT) 8.6 MG tablet Take 1 tablet by mouth as needed for  constipation.    . sotalol (BETAPACE) 120 MG tablet TAKE 1 TABLET 2 TIMES DAILY 180 tablet 0  . VENTOLIN HFA 108 (90 Base) MCG/ACT inhaler      No current facility-administered medications for this visit.     Allergies  Allergen Reactions  . Vancomycin Rash    Other Reaction: RASH & WELTS  . Prednisone     High dose causes rash and itching  . Penicillins Rash    Has patient had a  PCN reaction causing immediate rash, facial/tongue/throat swelling, SOB or lightheadedness with hypotension: No Has patient had a PCN reaction causing severe rash involving mucus membranes or skin necrosis: No Has patient had a PCN reaction that required hospitalization: No Has patient had a PCN reaction occurring within the last 10 years: No If all of the above answers are "NO", then may proceed with Cephalosporin use.   . Sulfa Antibiotics Diarrhea    Review of Systems negative except from HPI and PMH BP 138/84 (BP Location: Left Arm, Patient Position: Sitting, Cuff Size: Normal)   Pulse 64   Ht 5\' 7"  (1.702 m)   Wt 156 lb 4 oz (70.9 kg)   SpO2 96%   BMI 24.47 kg/m  Well developed and well nourished in no acute distress HENT normal Neck supple with JVP-flat Clear Device pocket well healed; without hematoma or erythema.  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  The information was reviewed. No changes were made in the programming.       Abnormal ECG  Asthma/COPD  Depression   HFpEF   No intercurrent Ventricular tachycardia  Euvolemic continue  current meds  Given her dyspnea, have suggested that we do cardiopulmonary stress testing to clarify the contributors.  Suggested she might also benefit from another opinion.  She had been previously seen at [facility].  We can help facilitate that if she desires.  Her device is approaching ERI.  Her comment that she is disinclined to think about getting it changed is that if she feels prompted discussion regarding depression.  She acknowledges it.  She will reach out to Dr. Derrel Nip.  As well I.  We spent more than 50% of our >25 min visit in face to face counseling regarding the above

## 2019-08-14 ENCOUNTER — Other Ambulatory Visit: Payer: Self-pay

## 2019-08-14 ENCOUNTER — Encounter: Payer: Self-pay | Admitting: Internal Medicine

## 2019-08-14 ENCOUNTER — Ambulatory Visit (INDEPENDENT_AMBULATORY_CARE_PROVIDER_SITE_OTHER): Payer: Medicare HMO | Admitting: Internal Medicine

## 2019-08-14 DIAGNOSIS — R69 Illness, unspecified: Secondary | ICD-10-CM | POA: Diagnosis not present

## 2019-08-14 DIAGNOSIS — F325 Major depressive disorder, single episode, in full remission: Secondary | ICD-10-CM | POA: Diagnosis not present

## 2019-08-14 MED ORDER — BUPROPION HCL 75 MG PO TABS: ORAL_TABLET | ORAL | 0 refills | Status: DC

## 2019-08-14 MED ORDER — DULOXETINE HCL 20 MG PO CPEP: 20.0000 mg | ORAL_CAPSULE | Freq: Every day | ORAL | 0 refills | Status: DC

## 2019-08-14 NOTE — Progress Notes (Signed)
Telephone  Note  This visit type was conducted due to national recommendations for restrictions regarding the COVID-19 pandemic (e.g. social distancing).  This format is felt to be most appropriate for this patient at this time.  All issues noted in this document were discussed and addressed.  No physical exam was performed (except for noted visual exam findings with Video Visits).   I connected with@ on 08/14/19 at  9:00 AM EST by telephone and verified that I am speaking with the correct person using two identifiers. Location patient: home Location provider: work or home office Persons participating in the virtual visit: patient, provider  I discussed the limitations, risks, security and privacy concerns of performing an evaluation and management service by telephone and the availability of in person appointments. I also discussed with the patient that there may be a patient responsible charge related to this service. The patient expressed understanding and agreed to proceed.  Reason for visit: depression, insomnia ,  Pain    HPI:  63 yr old female with history of lumbar radiculopathy, history of cardiac arrest  S/p ICD, major depressive disorder  Presents for follow up on depression  Insomnia brought on by chronic pain related to cervical and lumbar disk disease . She cannot find a comfortable position due to subsequent pain on either side if    Depressed and anxious.  Aggravated by her own health problems  chronic pain , and the consequences of COVID 19 pandemic restrictions.  Her son has been out of f work since May and she has had to support him financially .   She is undergoing evaluation for dyspnea with exertion.  Pulmonary workup reviewed .  She has Moderate copd by PFTS dine in Nov 2019.  EF 50% by May ECHO  . Stress test ordered . Needs ICD replaced for the 5th time     ROS: See pertinent positives and negatives per HPI.  Past Medical History:  Diagnosis Date  . Aborted  cardiac arrest 1994  . AICD (automatic cardioverter/defibrillator) present   . Allergy   . Anginal pain ([place])   . Arthritis   . Asthma    hx of asthma related to air bag release   . Dual implantable cardiac defibrillator    [employer] initially implanted in 1997  . DVT (deep vein thrombosis) in pregnancy 1986   had heparin shots followed by coumadin  . Fall    10/11/17 walking dogs   . Family history of seizures    Question neurological versus cardiac  . Heart murmur   . Irritable bowel syndrome   . 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 PONV (postoperative nausea and vomiting)   . Presence of permanent cardiac pacemaker   . PVC's (premature ventricular contractions)   . Raynaud's syndrome   . Shortness of breath dyspnea    with exertion   . Ventricular tachycardia [facility])     Past Surgical History:  Procedure Laterality Date  . ABDOMINAL HYSTERECTOMY    . APPENDECTOMY    . CARDIAC DEFIBRILLATOR PLACEMENT  1994   Dual chamber with pacemaker  . CARPAL TUNNEL RELEASE Right   . COLONOSCOPY WITH PROPOFOL N/A 01/18/2017   Procedure: COLONOSCOPY WITH PROPOFOL;  Surgeon: Manya Silvas, MD;  Location: [facility] ENDOSCOPY;  Service: Endoscopy;  Laterality: N/A;  . CYSTOCELE REPAIR N/A 06/19/2016   Procedure: ANTERIOR REPAIR (CYSTOCELE) WITH VAULT PROLAPSE;  Surgeon: Bjorn Loser, MD;  Location: [facility] ORS;  Service: Urology;  Laterality:  N/A;  . CYSTOSCOPY N/A 06/19/2016   Procedure: CYSTOSCOPY;  Surgeon: Bjorn Loser, MD;  Location: [facility] ORS;  Service: Urology;  Laterality: N/A;  . ESOPHAGOGASTRODUODENOSCOPY (EGD) WITH PROPOFOL N/A 01/18/2017   Procedure: ESOPHAGOGASTRODUODENOSCOPY (EGD) WITH PROPOFOL;  Surgeon: Manya Silvas, MD;  Location: [facility] ENDOSCOPY;  Service: Endoscopy;  Laterality: N/A;  . LAPAROSCOPIC LYSIS OF ADHESIONS    . NASAL SEPTUM SURGERY  [address]   . OOPHORECTOMY    . PACEMAKER INSERTION     5 different devices    Family History   Problem Relation Age of Onset  . Heart disease Mother 45       A Fib, CHF  . Stroke Mother   . Heart disease Father   . Stroke Father   . Seizures Brother   . Seizures Daughter   . Breast cancer Neg Hx     SOCIAL HX:  reports that she quit smoking about 24 years ago. Her smoking use included cigarettes. She has never used smokeless tobacco. She reports current alcohol use. She reports that she does not use drugs.   Current Outpatient Medications:  .  ANORO ELLIPTA 62.5-25 MCG/INH AEPB, INL 1 PUFF ITL QD, Disp: , Rfl:  .  cholecalciferol (VITAMIN D) 1000 units tablet, Take 2,000 Units by mouth daily. , Disp: , Rfl:  .  Cranberry 1000 MG CAPS, Take by mouth., Disp: , Rfl:  .  dicyclomine (BENTYL) 20 MG tablet, Take 1 tablet (20 mg total) by mouth 3 (three) times daily as needed for spasms., Disp: 30 tablet, Rfl: 1 .  docusate sodium (COLACE) 250 MG capsule, Take 250 mg by mouth 2 (two) times daily., Disp: , Rfl:  .  etodolac (LODINE) 500 MG tablet, Take 1 tablet (500 mg total) by mouth 2 (two) times daily. (Patient taking differently: Take 500 mg by mouth as needed. ), Disp: 60 tablet, Rfl: 2 .  famotidine (PEPCID) 20 MG tablet, Take 1 tablet (20 mg total) by mouth 2 (two) times daily. (Patient taking differently: Take 20 mg by mouth as needed. ), Disp: 60 tablet, Rfl: 2 .  fluticasone (FLONASE) 50 MCG/ACT nasal spray, Place 1 spray into both nostrils daily., Disp: 16 g, Rfl: 5 .  furosemide (LASIX) 20 MG tablet, Take 1 tablet (20 mg) by mouth 2-3 times per week as needed for increased shortness of breath/ swelling (Patient taking differently: as needed. Take 1 tablet (20 mg) by mouth 2-3 times per week as needed for increased shortness of breath/ swelling), Disp: 90 tablet, Rfl: 0 .  gabapentin (NEURONTIN) 100 MG capsule, Take 1 capsule (100 mg total) by mouth 3 (three) times daily. (Patient taking differently: Take 100 mg by mouth as needed. ), Disp: 90 capsule, Rfl: 3 .  loratadine  (CLARITIN) 10 MG tablet, Take 10 mg by mouth daily., Disp: , Rfl:  .  montelukast (SINGULAIR) 10 MG tablet, Take 1 tablet (10 mg total) by mouth at bedtime., Disp: 90 tablet, Rfl: 1 .  omeprazole (PRILOSEC) 40 MG capsule, Take 1 capsule (40 mg total) by mouth 2 (two) times daily., Disp: 180 capsule, Rfl: 1 .  ondansetron (ZOFRAN) 4 MG tablet, Take 1 tablet (4 mg total) by mouth every 8 (eight) hours as needed for nausea or vomiting., Disp: 30 tablet, Rfl: 0 .  senna (SENOKOT) 8.6 MG tablet, Take 1 tablet by mouth as needed for constipation., Disp: , Rfl:  .  sotalol (BETAPACE) 120 MG tablet, TAKE 1 TABLET 2 TIMES  DAILY, Disp: 180 tablet, Rfl: 0 .  VENTOLIN HFA 108 (90 Base) MCG/ACT inhaler, , Disp: , Rfl:  .  buPROPion (WELLBUTRIN) 75 MG tablet, Once daily in the morning, Disp: 30 tablet, Rfl: 0 .  DULoxetine (CYMBALTA) 20 MG capsule, Take 1 capsule (20 mg total) by mouth daily with supper., Disp: 30 capsule, Rfl: 0  EXAM:  VITALS per patient if applicable:  GENERAL: alert, oriented, appears well and in no acute distress  HEENT: atraumatic, conjunttiva clear, no obvious abnormalities on inspection of external nose and ears  NECK: normal movements of the head and neck  LUNGS: on inspection no signs of respiratory distress, breathing rate appears normal, no obvious gross SOB, gasping or wheezing  CV: no obvious cyanosis  MS: moves all visible extremities without noticeable abnormality  PSYCH/NEURO: pleasant and cooperative, no obvious depression or anxiety, speech and thought processing grossly intact  ASSESSMENT AND PLAN:  Discussed the following assessment and plan:  Major depressive disorder with single episode, in full remission ([place])  Major depressive disorder, single episode Recurrent.  Initial episode occurred after the loss of her husband to lung cancer in 2017.  She has not taken medication in over a year, but the stress of the current year has been significant.  She is not  suicidal,  But has multiple negative symptoms and anxietyRecommend adding cymbalta for chronic pain and wellbutrin for fatigue.  RTC 3-4 weeks     I discussed the assessment and treatment plan with the patient. The patient was provided an opportunity to ask questions and all were answered. The patient agreed with the plan and demonstrated an understanding of the instructions.   The patient was advised to call back or seek an in-person evaluation if the symptoms worsen or if the condition fails to improve as anticipated.  I provided  25 minutes of non-face-to-face time during this encounter reviewing patient's current problems and past procedures/imaging studies, providing counseling on the above mentioned problems , and coordination  of care .   [name], MD

## 2019-08-14 NOTE — Patient Instructions (Signed)
I recommend a trial of the following two medications:  Cymbalta in the evening with dinner wellbutrin once daily in the morning with breakfast

## 2019-08-15 ENCOUNTER — Other Ambulatory Visit: Payer: Self-pay | Admitting: Internal Medicine

## 2019-08-16 NOTE — Assessment & Plan Note (Addendum)
Recurrent.  Initial episode occurred after the loss of her husband to lung cancer in 2017.  She has not taken medication in over a year, but the stress of the current year has been significant.  She is not suicidal,  But has multiple negative symptoms and anxietyRecommend adding cymbalta for chronic pain and wellbutrin for fatigue.  RTC 3-4 weeks

## 2019-08-18 LAB — CUP PACEART INCLINIC DEVICE CHECK
Battery Voltage: 2.62 V
Brady Statistic AP VP Percent: 0.03 %
Brady Statistic AP VS Percent: 70.54 %
Brady Statistic AS VP Percent: 0.03 %
Brady Statistic AS VS Percent: 29.41 %
Brady Statistic RA Percent Paced: 69.43 %
Brady Statistic RV Percent Paced: 0.05 %
Date Time Interrogation Session: 20201208124222
HighPow Impedance: 494 Ohm
HighPow Impedance: 55 Ohm
HighPow Impedance: 78 Ohm
Implantable Lead Implant Date: 19970917
Implantable Lead Implant Date: 20070119
Implantable Lead Location: 753859
Implantable Lead Location: 753860
Implantable Lead Model: 5076
Implantable Lead Model: 6942
Implantable Pulse Generator Implant Date: 20130722
Lead Channel Impedance Value: 456 Ohm
Lead Channel Impedance Value: 494 Ohm
Lead Channel Pacing Threshold Amplitude: 0.75 V
Lead Channel Pacing Threshold Amplitude: 1 V
Lead Channel Pacing Threshold Pulse Width: 0.4 ms
Lead Channel Pacing Threshold Pulse Width: 0.4 ms
Lead Channel Sensing Intrinsic Amplitude: 12.75 mV
Lead Channel Sensing Intrinsic Amplitude: 2.25 mV
Lead Channel Setting Pacing Amplitude: 2 V
Lead Channel Setting Pacing Amplitude: 2.5 V
Lead Channel Setting Pacing Pulse Width: 0.4 ms
Lead Channel Setting Sensing Sensitivity: 0.45 mV

## 2019-08-19 ENCOUNTER — Telehealth: Payer: Self-pay | Admitting: Internal Medicine

## 2019-08-19 ENCOUNTER — Other Ambulatory Visit: Payer: Self-pay

## 2019-08-19 ENCOUNTER — Ambulatory Visit
Admission: RE | Admit: 2019-08-19 | Discharge: 2019-08-19 | Disposition: A | Discharge status: Home / Self Care | Payer: Medicare HMO | Source: Ambulatory Visit | Attending: Neurosurgery | Admitting: Neurosurgery

## 2019-08-19 DIAGNOSIS — M5416 Radiculopathy, lumbar region: Secondary | ICD-10-CM | POA: Diagnosis not present

## 2019-08-19 DIAGNOSIS — M5126 Other intervertebral disc displacement, lumbar region: Secondary | ICD-10-CM | POA: Diagnosis not present

## 2019-08-19 NOTE — Telephone Encounter (Signed)
The patient was seen in clinic last week with Dr. [name] and a CPX test was ordered. I had advised her at that time it had been a while since I had scheduled one of these and would need to call her back about her appointment. In the interim I had messaged the CHF clinic RN in [place] and was told the patient could call [phone] to schedule herself. She will need a COVID swab prior to the test.   I have called and spoken with the patient and advised her of the above information. She will call to schedule. She is aware I will look out for when her test is scheduled and call her back with details of her COVID swab that can be done here are [facility].  The patient voices understanding and is agreeable.

## 2019-08-21 NOTE — Telephone Encounter (Signed)
The patient's CPX test is scheduled for 09/10/2019. She is scheduled for a COVID swab on 09/07/2019.  I sent the patient a patient advise request to make sure she was aware of the date of her COVID swab.

## 2019-08-26 ENCOUNTER — Other Ambulatory Visit: Payer: Self-pay

## 2019-08-26 NOTE — Progress Notes (Signed)
ICD remote 

## 2019-08-31 ENCOUNTER — Encounter: Payer: Self-pay | Admitting: Internal Medicine

## 2019-08-31 ENCOUNTER — Other Ambulatory Visit: Payer: Self-pay

## 2019-08-31 ENCOUNTER — Ambulatory Visit: Payer: Medicare HMO | Admitting: Internal Medicine

## 2019-08-31 DIAGNOSIS — F321 Major depressive disorder, single episode, moderate: Secondary | ICD-10-CM | POA: Diagnosis not present

## 2019-08-31 DIAGNOSIS — M5000 Cervical disc disorder with myelopathy, unspecified cervical region: Secondary | ICD-10-CM

## 2019-08-31 DIAGNOSIS — R69 Illness, unspecified: Secondary | ICD-10-CM | POA: Diagnosis not present

## 2019-08-31 MED ORDER — DULOXETINE HCL 30 MG PO CPEP: 30.0000 mg | ORAL_CAPSULE | Freq: Every day | ORAL | 5 refills | Status: DC

## 2019-08-31 MED ORDER — BUPROPION HCL 75 MG PO TABS: 75.0000 mg | ORAL_TABLET | Freq: Two times a day (BID) | ORAL | 5 refills | Status: DC

## 2019-08-31 NOTE — Progress Notes (Signed)
Subjective:  Patient ID: Nicole Swanson, female    DOB: November 15, 1955  Age: 63 y.o. MRN: [id number]  CC: Diagnoses of Current moderate episode of major depressive disorder without prior episode ([place]) and Intervertebral cervical disc disorder with myelopathy, cervical region were pertinent to this visit.   This visit occurred during the SARS-CoV-2 public health emergency.  Safety protocols were in place, including screening questions prior to the visit, additional usage of staff PPE, and extensive cleaning of exam room while observing appropriate contact time as indicated for disinfecting solutions.    HPI DESTINIE DONA presents for follow up on depression aggravated by chronic neck pain with recent therapy initiated on Dec 11.  Cymbalta 20 mg daily and wellbutrin 75 mg once daily started .  tolerating medications.  Feels moderately better.    Cervical radiculopathy;:  She is Tolerating pain better,  Less sad and unmotivated. She is using tramadol infrequently due to side effects.  Using  motrin and tylenol.  On PPI bid.  Side sleeper: Can't find a good sleeping position because of left shoulder pain and right hip pain. Suggested using a body bolster pillow   Denies loneliness; active in her church. Doesn't have a dog currently.  Outpatient Medications Prior to Visit  Medication Sig Dispense Refill  . ANORO ELLIPTA 62.5-25 MCG/INH AEPB INL 1 PUFF ITL QD    . cholecalciferol (VITAMIN D) 1000 units tablet Take 2,000 Units by mouth daily.     . Cranberry 1000 MG CAPS Take by mouth.    . dicyclomine (BENTYL) 20 MG tablet Take 1 tablet (20 mg total) by mouth 3 (three) times daily as needed for spasms. 30 tablet 1  . docusate sodium (COLACE) 250 MG capsule Take 250 mg by mouth 2 (two) times daily.    . fluticasone (FLONASE) 50 MCG/ACT nasal spray Place 1 spray into both nostrils daily. 16 g 5  . furosemide (LASIX) 20 MG tablet Take 1 tablet (20 mg) by mouth 2-3 times per week as needed for increased  shortness of breath/ swelling (Patient taking differently: as needed. Take 1 tablet (20 mg) by mouth 2-3 times per week as needed for increased shortness of breath/ swelling) 90 tablet 0  . gabapentin (NEURONTIN) 100 MG capsule Take 1 capsule (100 mg total) by mouth 3 (three) times daily. (Patient taking differently: Take 100 mg by mouth as needed. ) 90 capsule 3  . loratadine (CLARITIN) 10 MG tablet Take 10 mg by mouth daily.    . montelukast (SINGULAIR) 10 MG tablet TAKE 1 TABLET AT BEDTIME 90 tablet 3  . omeprazole (PRILOSEC) 40 MG capsule Take 1 capsule (40 mg total) by mouth 2 (two) times daily. 180 capsule 1  . ondansetron (ZOFRAN) 4 MG tablet Take 1 tablet (4 mg total) by mouth every 8 (eight) hours as needed for nausea or vomiting. 30 tablet 0  . senna (SENOKOT) 8.6 MG tablet Take 1 tablet by mouth as needed for constipation.    . sotalol (BETAPACE) 120 MG tablet TAKE 1 TABLET 2 TIMES DAILY 180 tablet 0  . VENTOLIN HFA 108 (90 Base) MCG/ACT inhaler     . buPROPion (WELLBUTRIN) 75 MG tablet Once daily in the morning 30 tablet 0  . DULoxetine (CYMBALTA) 20 MG capsule Take 1 capsule (20 mg total) by mouth daily with supper. 30 capsule 0  . etodolac (LODINE) 500 MG tablet Take 1 tablet (500 mg total) by mouth 2 (two) times daily. (Patient taking differently: Take 500  mg by mouth as needed. ) 60 tablet 2  . famotidine (PEPCID) 20 MG tablet Take 1 tablet (20 mg total) by mouth 2 (two) times daily. (Patient taking differently: Take 20 mg by mouth as needed. ) 60 tablet 2   No facility-administered medications prior to visit.    Review of Systems;  Patient denies headache, fevers, malaise, unintentional weight loss, skin rash, eye pain, sinus congestion and sinus pain, sore throat, dysphagia,  hemoptysis , cough, dyspnea, wheezing, chest pain, palpitations, orthopnea, edema, abdominal pain, nausea, melena, diarrhea, constipation, flank pain, dysuria, hematuria, urinary  Frequency, nocturia,  numbness, tingling, seizures,  Focal weakness, Loss of consciousness,  Tremor, insomnia, depression, anxiety, and suicidal ideation.      Objective:  BP 122/78 (BP Location: Left Arm, Patient Position: Sitting, Cuff Size: Normal)   Pulse 86   Temp (!) 96.5 F (35.8 C) (Temporal)   Resp 15   Ht 5\' 7"  (1.702 m)   Wt 156 lb (70.8 kg)   SpO2 97%   BMI 24.43 kg/m   BP Readings from Last 3 Encounters:  08/31/19 122/78  08/11/19 138/84  02/24/19 128/60    Wt Readings from Last 3 Encounters:  08/31/19 156 lb (70.8 kg)  08/14/19 156 lb (70.8 kg)  08/11/19 156 lb 4 oz (70.9 kg)    General appearance: alert, cooperative and appears stated age Ears: normal TM's and external ear canals both ears Throat: lips, mucosa, and tongue normal; teeth and gums normal Neck: no adenopathy, no carotid bruit, supple, symmetrical, trachea midline and thyroid not enlarged, symmetric, no tenderness/mass/nodules Back: symmetric, no curvature. ROM normal. No CVA tenderness. Lungs: clear to auscultation bilaterally Heart: regular rate and rhythm, S1, S2 normal, no murmur, click, rub or gallop Abdomen: soft, non-tender; bowel sounds normal; no masses,  no organomegaly Pulses: 2+ and symmetric Skin: Skin color, texture, turgor normal. No rashes or lesions Lymph nodes: Cervical, supraclavicular, and axillary nodes normal. Psych: affect normal, makes good eye contact. No fidgeting,    Denies suicidal thoughts   No results found for: HGBA1C  Lab Results  Component Value Date   CREATININE 0.85 02/24/2019   CREATININE 0.88 08/13/2018   CREATININE 0.69 07/17/2018    Lab Results  Component Value Date   WBC 4.4 02/24/2019   HGB 14.2 02/24/2019   HCT 42.7 02/24/2019   PLT 169 02/24/2019   GLUCOSE 113 (H) 02/24/2019   CHOL 226 (H) 02/21/2018   TRIG 78.0 02/21/2018   HDL 85.70 02/21/2018   LDLCALC 125 (H) 02/21/2018   ALT 10 02/24/2019   AST 17 02/24/2019   NA 141 02/24/2019   K 4.5 02/24/2019    CL 102 02/24/2019   CREATININE 0.85 02/24/2019   BUN 16 02/24/2019   CO2 21 02/24/2019   TSH 1.980 02/24/2019   INR 0.99 06/19/2016    CT LUMBAR SPINE WO CONTRAST  Result Date: 08/19/2019 CLINICAL DATA:  Low back and right leg pain for 2 months. EXAM: CT LUMBAR SPINE WITHOUT CONTRAST TECHNIQUE: Multidetector CT imaging of the lumbar spine was performed without intravenous contrast administration. Multiplanar CT image reconstructions were also generated. COMPARISON:  None. FINDINGS: Segmentation: There are five lumbar type vertebral bodies. The last full intervertebral disc space is labeled L5-S1. Alignment: Normal Vertebrae: No fractures or bone lesions.  Osteoporosis. Paraspinal and other soft tissues: No significant paraspinal or retroperitoneal findings. There are age advanced atherosclerotic calcifications involving the aorta and iliac arteries. No aneurysm. Disc levels: T12-L1: No significant findings. L1-2: Shallow  right paracentral disc protrusion with mild mass effect on the right side of the thecal sac potentially irritating the right L2 nerve root in the lateral recess. No significant spinal or foraminal stenosis. L2-3: Diffuse bulging annulus with mild flattening of the ventral thecal sac and mild bilateral lateral recess encroachment. No signal foraminal stenosis. Potential irritation of both L2 nerve roots extra foraminally. Mild facet disease. L3-4: Mild annular bulge and mild facet disease but no significant disc protrusions, spinal or foraminal stenosis. L4-5: Mild annular bulge but no focal disc protrusion, spinal or foraminal stenosis. Moderate facet disease. L5-S1: Moderate facet disease but no disc protrusions, spinal or foraminal stenosis. IMPRESSION: 1. Shallow right paracentral disc protrusion at L1-2 potentially irritating the right L2 nerve root in the lateral recess. 2. Diffuse bulging annulus at L2-3 with flattening of the ventral thecal sac and mild bilateral lateral recess  encroachment. There is also potential irritation of both L2 nerve roots extra foraminally. 3. No significant findings at L3-4, L4-5 or L5-S1. 4. Age advanced vascular calcifications. Electronically Signed   By: Marijo Sanes M.D.   On: 08/19/2019 16:24    Assessment & Plan:   Problem List Items Addressed This Visit      Unprioritized   Intervertebral cervical disc disorder with myelopathy, cervical region    She is scheduled to see neurosurgery in the near future for treatment.    Motrin /tyuelnol/cymbalta use outlined       Major depressive disorder, single episode    Improving with pain management and antidepressant,  Increase cymbalta to 30 mg daily,  encourataged to increase wellbutrin to 75 mg bid       Relevant Medications   DULoxetine (CYMBALTA) 30 MG capsule   buPROPion (WELLBUTRIN) 75 MG tablet     A total of 25 minutes of face to face time was spent with patient more than half of which was spent in counselling about the above mentioned conditions  and coordination of care    I have discontinued Marylyn A. Ambrosino's famotidine and etodolac. I have also changed her DULoxetine and buPROPion. Additionally, I am having her maintain her docusate sodium, Cranberry, senna, cholecalciferol, dicyclomine, ondansetron, omeprazole, fluticasone, Ventolin HFA, loratadine, Anoro Ellipta, gabapentin, furosemide, sotalol, and montelukast.  Meds ordered this encounter  Medications  . DULoxetine (CYMBALTA) 30 MG capsule    Sig: Take 1 capsule (30 mg total) by mouth daily with supper.    Dispense:  30 capsule    Refill:  5  . buPROPion (WELLBUTRIN) 75 MG tablet    Sig: Take 1 tablet (75 mg total) by mouth 2 (two) times daily. Once daily in the morning    Dispense:  60 tablet    Refill:  5    Medications Discontinued During This Encounter  Medication Reason  . DULoxetine (CYMBALTA) 20 MG capsule   . buPROPion (WELLBUTRIN) 75 MG tablet   . etodolac (LODINE) 500 MG tablet   . famotidine  (PEPCID) 20 MG tablet     Follow-up: Return in about 3 months (around 11/29/2019).   [name], MD

## 2019-08-31 NOTE — Patient Instructions (Addendum)
I'm glad the medications are helping!  I recommend increasing the Cymbalta to 30 mg daily for chronic pain management and have sent the higher dose to your local pharmacy  The wellbutrin has been refilled for #60 tablets/capsules per month and can be continued as you are currently taking,  Or you  Can :  1) add an afternoon dose (by 3 pm)  2) double up on the morning dose  Consider increasing your tylenol to 2000 mg daily in divided doses  Use ibuprofen 600 mg  Up to 3 times daily

## 2019-08-31 NOTE — Assessment & Plan Note (Signed)
Improving with pain management and antidepressant,  Increase cymbalta to 30 mg daily,  encourataged to increase wellbutrin to 75 mg bid

## 2019-08-31 NOTE — Assessment & Plan Note (Signed)
She is scheduled to see neurosurgery in the near future for treatment.    Motrin /tyuelnol/cymbalta use outlined

## 2019-09-07 ENCOUNTER — Other Ambulatory Visit
Admission: RE | Admit: 2019-09-07 | Discharge: 2019-09-07 | Disposition: A | Discharge status: Home / Self Care | Payer: Medicare HMO | Source: Ambulatory Visit | Attending: Internal Medicine | Admitting: Internal Medicine

## 2019-09-07 ENCOUNTER — Other Ambulatory Visit: Payer: Medicare HMO

## 2019-09-07 ENCOUNTER — Ambulatory Visit (INDEPENDENT_AMBULATORY_CARE_PROVIDER_SITE_OTHER): Payer: Medicare HMO

## 2019-09-07 DIAGNOSIS — Z01812 Encounter for preprocedural laboratory examination: Secondary | ICD-10-CM | POA: Insufficient documentation

## 2019-09-07 DIAGNOSIS — Z95810 Presence of automatic (implantable) cardiac defibrillator: Secondary | ICD-10-CM | POA: Diagnosis not present

## 2019-09-07 DIAGNOSIS — Z20822 Contact with and (suspected) exposure to covid-19: Secondary | ICD-10-CM | POA: Insufficient documentation

## 2019-09-07 DIAGNOSIS — I5030 Unspecified diastolic (congestive) heart failure: Secondary | ICD-10-CM | POA: Diagnosis not present

## 2019-09-07 LAB — SARS CORONAVIRUS 2 (TAT 6-24 HRS): SARS Coronavirus 2: NEGATIVE

## 2019-09-10 ENCOUNTER — Other Ambulatory Visit: Payer: Self-pay

## 2019-09-10 ENCOUNTER — Ambulatory Visit (HOSPITAL_COMMUNITY): Payer: Medicare HMO | Attending: Internal Medicine

## 2019-09-10 DIAGNOSIS — R0600 Dyspnea, unspecified: Secondary | ICD-10-CM | POA: Insufficient documentation

## 2019-09-10 DIAGNOSIS — R0602 Shortness of breath: Secondary | ICD-10-CM | POA: Diagnosis not present

## 2019-09-11 NOTE — Progress Notes (Signed)
EPIC Encounter for ICM Monitoring  Patient Name: Nicole Swanson is a 64 y.o. female Date: 09/11/2019 Primary Care Physican: [name], MD Primary Cardiologist:Klein Electrophysiologist:Klein 09/11/2019 Weight: 153 lbs  Battery 2.61   Heart Failure questions reviewed. Pt has occasional shortness of breath and dry cough.  Explained these symptoms may be related to fluid accumulation.     Optivol thoracic impedancenormal but was suggesting possible fluid accumulation from 12/20  - 1/1.  Prescribed: Furosemide20 mgTake 1 tablet (20 mg) by mouth 2-3 times per week as needed for increased shortness of breath/swelling. She has been taking Furosemide 2-3 times a week.  Labs: 02/24/2019 Creatinine0.85, BUN16, Potassium4.5, Sodium141, [id number] A complete set of results can be found in Results Review.  Recommendations: Advised to take PRN Furosemide when she has a cough and feels Rhealyn Cullen of breath x 2 days.  If symptoms persist after PRN Furosemide encouraged to call the office.  Encouraged to keep calendar of days with sx and when she takes PRN Furosemide so can compare to monthly device reports.   Follow-up plan: ICM clinic phone appointment on2/15/2021. 91 day device clinic remote transmission 09/29/2019. Telehealth visit scheduled 2/16/2021with Dr. [name].   Copy of ICM check sent to [place]   3 month ICM trend: 09/07/2019    1 Year ICM trend:       [name], RN 09/11/2019 3:25 PM

## 2019-09-16 DIAGNOSIS — M5416 Radiculopathy, lumbar region: Secondary | ICD-10-CM | POA: Diagnosis not present

## 2019-09-29 ENCOUNTER — Ambulatory Visit (INDEPENDENT_AMBULATORY_CARE_PROVIDER_SITE_OTHER): Payer: Medicare HMO | Admitting: *Deleted

## 2019-09-29 DIAGNOSIS — I469 Cardiac arrest, cause unspecified: Secondary | ICD-10-CM | POA: Diagnosis not present

## 2019-09-30 LAB — CUP PACEART REMOTE DEVICE CHECK
Battery Voltage: 2.63 V
Brady Statistic AP VP Percent: 0.01 %
Brady Statistic AP VS Percent: 60.31 %
Brady Statistic AS VP Percent: 0.04 %
Brady Statistic AS VS Percent: 39.64 %
Brady Statistic RA Percent Paced: 60.1 %
Brady Statistic RV Percent Paced: 0.05 %
Date Time Interrogation Session: 20210126001804
HighPow Impedance: 456 Ohm
HighPow Impedance: 53 Ohm
HighPow Impedance: 79 Ohm
Implantable Lead Implant Date: 19970917
Implantable Lead Implant Date: 20070119
Implantable Lead Location: 753859
Implantable Lead Location: 753860
Implantable Lead Model: 5076
Implantable Lead Model: 6942
Implantable Pulse Generator Implant Date: 20130722
Lead Channel Impedance Value: 456 Ohm
Lead Channel Impedance Value: 494 Ohm
Lead Channel Pacing Threshold Amplitude: 0.625 V
Lead Channel Pacing Threshold Amplitude: 0.875 V
Lead Channel Pacing Threshold Pulse Width: 0.4 ms
Lead Channel Pacing Threshold Pulse Width: 0.4 ms
Lead Channel Sensing Intrinsic Amplitude: 1.875 mV
Lead Channel Sensing Intrinsic Amplitude: 1.875 mV
Lead Channel Sensing Intrinsic Amplitude: 11 mV
Lead Channel Sensing Intrinsic Amplitude: 11 mV
Lead Channel Setting Pacing Amplitude: 2 V
Lead Channel Setting Pacing Amplitude: 2.5 V
Lead Channel Setting Pacing Pulse Width: 0.4 ms
Lead Channel Setting Sensing Sensitivity: 0.45 mV

## 2019-10-01 ENCOUNTER — Telehealth: Payer: Self-pay | Admitting: Internal Medicine

## 2019-10-01 ENCOUNTER — Other Ambulatory Visit: Payer: Self-pay

## 2019-10-01 DIAGNOSIS — R0602 Shortness of breath: Secondary | ICD-10-CM

## 2019-10-01 DIAGNOSIS — Z01812 Encounter for preprocedural laboratory examination: Secondary | ICD-10-CM

## 2019-10-01 MED ORDER — SOTALOL HCL 120 MG PO TABS: ORAL_TABLET | ORAL | 0 refills | Status: DC

## 2019-10-01 NOTE — Telephone Encounter (Signed)
Late entry:- I spoke with the patient on Tuesday evening (1/26) regarding her CPX results and Dr. Olin Pia recommendations.  I have advised the patient that per Dr. [name], her CPX test does not really show that her dyspnea is cardiac or pulmonary in nature. With that being said, she did have a flat blood pressure reading during exercise, which could suggest there may be a cardiac source for her dyspnea.   After further discussion with Dr. Haroldine Laws, Dr. [name] has recommended that the patient: 1) Proceed with a CT Calcium score to determine risk. If this is abnormal, she would require a right and left heart cath 2) Also obtain a VQ scan to rule out underlying Pulmonary Embolus as her dyspnea seemed [place].   The patient stated that her CPX eluded to diastolic dyfunction, which she thought had been found on her last echo.  She was also concerned about comments mentioning possible pulmonary hypertension. I have advised the patient that if we needed to go the route of a Right & Left heart cath, the right heart cath would look at her pulmonary pressures.  The patient did agree to setting up the CT calcium score. She is aware this will be a $150 out of pocket cost. She would like to wait in the VQ scan.  She is asking that Dr. [name] look at her last echo again as well as her most recent transmission to assess her volume status.  The patient is followed in [facility]. Her last transmission was on 09/07/19.  I have spoken with Dr. [name] about reviewing the patient's echo. I will ask him to review her Optivol readings as well.  I advised the patient I would not order any testing on her until Dr. [name] reviewed her echo/ ICM report and we call her back. The patient voices understanding and is agreeable.

## 2019-10-02 ENCOUNTER — Encounter: Payer: Self-pay | Admitting: Internal Medicine

## 2019-10-06 ENCOUNTER — Telehealth: Payer: Self-pay | Admitting: Internal Medicine

## 2019-10-06 NOTE — Telephone Encounter (Signed)
Thanks Megan!

## 2019-10-06 NOTE — Telephone Encounter (Signed)
To Dr. [name] pharmacy team to review and advise.

## 2019-10-06 NOTE — Telephone Encounter (Signed)
Stiolto is a beta 2 agonist inhaler - sotalol may counteract this as a non-selective beta blocker. Ok to use together but would advise pt to monitor for decreased efficacy of inhaler and follow up with Dr [name] if so.

## 2019-10-06 NOTE — Telephone Encounter (Signed)
[place] calling to make a drug interaction known. Recently the patient was prescribed sotalol 120 mg and it interacts with stiolto (prescribed by Dr. [name])    Please advise at [phone]  Ref [id number]

## 2019-10-08 ENCOUNTER — Other Ambulatory Visit: Payer: Self-pay

## 2019-10-08 MED ORDER — DULOXETINE HCL 30 MG PO CPEP: 30.0000 mg | ORAL_CAPSULE | Freq: Every day | ORAL | 1 refills | Status: AC

## 2019-10-08 MED ORDER — BUPROPION HCL 75 MG PO TABS: 75.0000 mg | ORAL_TABLET | Freq: Two times a day (BID) | ORAL | 1 refills | Status: DC

## 2019-10-08 NOTE — Telephone Encounter (Signed)
I spoke with Dr. [name] about next steps for the patient. He states he called and spoke with her.  The plan is for a right and left heart cath to be done with Dr. Haroldine Laws.  Staff message sent to Kevan Rosebush, RN for Dr. Haroldine Laws to check on possible dates for this to be done.

## 2019-10-11 ENCOUNTER — Ambulatory Visit: Payer: Medicare HMO | Attending: Internal Medicine

## 2019-10-11 DIAGNOSIS — Z23 Encounter for immunization: Secondary | ICD-10-CM | POA: Insufficient documentation

## 2019-10-11 NOTE — Progress Notes (Signed)
   Covid-19 Vaccination Clinic  Name:  Nicole Swanson    MRN: [id number] DOB: 10-29-1955  10/11/2019  [name] was observed post Covid-19 immunization for 15 minutes without incidence. She was provided with Vaccine Information Sheet and instruction to access the V-Safe system.   [name] was instructed to call 911 with any severe reactions post vaccine: Marland Kitchen Difficulty breathing  . Swelling of your face and throat  . A fast heartbeat  . A bad rash all over your body  . Dizziness and weakness    Immunizations Administered    Name Date Dose VIS Date Route   Pfizer COVID-19 Vaccine 10/11/2019  4:42 PM 0.3 mL 08/14/2019 Intramuscular   Manufacturer: [place]   Lot: [id number]   [place]: [id number]

## 2019-10-12 ENCOUNTER — Telehealth: Payer: Self-pay | Admitting: Internal Medicine

## 2019-10-12 NOTE — Telephone Encounter (Signed)
[facility] called they are needing clarification on the buPROPion [facility]) 75 MG tablet   Please call [phone]   ref [id number]

## 2019-10-14 ENCOUNTER — Other Ambulatory Visit: Payer: Self-pay

## 2019-10-14 MED ORDER — FUROSEMIDE 20 MG PO TABS: ORAL_TABLET | ORAL | 0 refills | Status: AC

## 2019-10-15 ENCOUNTER — Other Ambulatory Visit: Payer: Self-pay

## 2019-10-15 ENCOUNTER — Telehealth: Payer: Self-pay | Admitting: Internal Medicine

## 2019-10-15 MED ORDER — BUPROPION HCL 75 MG PO TABS: 75.0000 mg | ORAL_TABLET | Freq: Two times a day (BID) | ORAL | 1 refills | Status: DC

## 2019-10-15 MED ORDER — SOTALOL HCL 120 MG PO TABS: ORAL_TABLET | ORAL | 3 refills | Status: AC

## 2019-10-15 NOTE — Telephone Encounter (Signed)
Pharmacy is needing clarification on directions of use for the wellbutrin, there is two different directions in the sig.

## 2019-10-15 NOTE — Addendum Note (Signed)
Addended by: [name] on: 10/15/2019 09:39 PM   Modules accepted: Orders

## 2019-10-15 NOTE — Telephone Encounter (Signed)
Direction changed to twice daily and resent to [employer]

## 2019-10-15 NOTE — Telephone Encounter (Signed)
Please call to discuss if patient still needs her virtual appointment on 2/16

## 2019-10-16 NOTE — Telephone Encounter (Signed)
Late entry- per Dr. [name] he spoke with the patient by phone and she needs to have a right and left heart cath done with Dr. Haroldine Laws.  I have obtained dates from Kevan Rosebush, RN for Dr. Haroldine Laws as to when he would be available for this. The patient has chosen 10/26/19.  Dr. [name] is also trying to get her battery change out done that day. She is due for another transmission on Monday 2/15 per [facility], to confirm ERI.   I have been in touch with the patient via MyChart that I am working on getting her procedures set up and will be in touch with her on Tuesday next week to confirm everything.  Will need to arrange for labs and a pre-Procedure COVID swab to be done.

## 2019-10-16 NOTE — Telephone Encounter (Signed)
My Chart message sent to the patient after speaking with Dr. [name], that she does not need to keep her virtual visit scheduled with him on 10/20/19.  Appt cancelled.

## 2019-10-19 ENCOUNTER — Ambulatory Visit (INDEPENDENT_AMBULATORY_CARE_PROVIDER_SITE_OTHER): Payer: Medicare HMO

## 2019-10-19 DIAGNOSIS — Z95810 Presence of automatic (implantable) cardiac defibrillator: Secondary | ICD-10-CM | POA: Diagnosis not present

## 2019-10-19 DIAGNOSIS — I5030 Unspecified diastolic (congestive) heart failure: Secondary | ICD-10-CM

## 2019-10-20 ENCOUNTER — Telehealth: Payer: Medicare HMO | Admitting: Internal Medicine

## 2019-10-20 NOTE — Telephone Encounter (Signed)
My Chart message forwarded to the patient to watch out for decreased efficacy of her stiolto and contact Dr. [name] is any issues.

## 2019-10-20 NOTE — Telephone Encounter (Signed)
Staff message received from [place], Device RN this morning:  Mechele Dawley, RN  Emily Filbert, RN  Her battery voltage was 2.62V as of 2/15. Battery voltage is at ERI, but has not technically triggered ERI yet.         Reviewed with Dr. [name]- per MD, he will go ahead and plan on changing out her device on 2/22 following her right and left heart cath with Dr. Haroldine Laws.     You are scheduled for a Right & Left  Cardiac Catheterization & Defibrillator Generator (Battery) Change Out on Monday, February 22 with Dr. Glori Bickers & Dr. Virl Axe.   1. Please arrive at the [facility] (Main Entrance A) at [facility]: [address] [place], [place] [address] at 10:00 AM (This time is two hours before your procedure to ensure your preparation). Free valet parking service is available.   Special note: Every effort is made to have your procedure done on time. Please understand that emergencies sometimes delay scheduled procedures.  2. Diet: Do not eat solid foods after midnight.  You may have clear liquids until 5am upon the day of the procedure.  3. Labs:  1st- Pre procedure labs:  - Thursday 10/22/19 (12:00 pm- 2:00 pm) - Medical Mall entrance at [facility], 1st desk on the right once you stop by the screening desk.   2nd-  Pre- procedure COVID swab: - Thursday 10/22/19 (12:30 pm- 2:30 pm) - [facility] Arts entrance at [facility], drive up testing   4. Medication instructions in preparation for your procedure:   Contrast Allergy: No  HOLD lasix (furosemide) the morning or your procedure  On the morning of your procedure, take  Aspirin 81 mg and any morning medicines NOT listed above.  You may use sips of water.  5. Plan for one night stay--bring personal belongings. 6. Bring a current list of your medications and current insurance cards. 7. You MUST have a responsible person to drive you home. 8. Someone MUST be with you the first 24 hours after you arrive home or your  discharge will be delayed. 9. Please wear clothes that are easy to get on and off and wear slip-on shoes.  Thank you for allowing [place] to care for you!   -- [facility]

## 2019-10-20 NOTE — Telephone Encounter (Signed)
Copy of the instructions below were forwarded to the patient via [place].  I asked that she contact me with any further questions/ concerns.

## 2019-10-22 ENCOUNTER — Other Ambulatory Visit
Admission: RE | Admit: 2019-10-22 | Discharge: 2019-10-22 | Disposition: A | Discharge status: Home / Self Care | Payer: Medicare HMO | Source: Ambulatory Visit | Attending: Internal Medicine | Admitting: Internal Medicine

## 2019-10-22 DIAGNOSIS — Z01812 Encounter for preprocedural laboratory examination: Secondary | ICD-10-CM | POA: Insufficient documentation

## 2019-10-22 DIAGNOSIS — R0602 Shortness of breath: Secondary | ICD-10-CM

## 2019-10-22 DIAGNOSIS — Z20822 Contact with and (suspected) exposure to covid-19: Secondary | ICD-10-CM | POA: Insufficient documentation

## 2019-10-22 LAB — CBC WITH DIFFERENTIAL/PLATELET
Abs Immature Granulocytes: 0.02 10*3/uL (ref 0.00–0.07)
Basophils Absolute: 0 10*3/uL (ref 0.0–0.1)
Basophils Relative: 1 %
Eosinophils Absolute: 0.1 10*3/uL (ref 0.0–0.5)
Eosinophils Relative: 3 %
HCT: 41 % (ref 36.0–46.0)
Hemoglobin: 13 g/dL (ref 12.0–15.0)
Immature Granulocytes: 1 %
Lymphocytes Relative: 26 %
Lymphs Abs: 1.1 10*3/uL (ref 0.7–4.0)
MCH: 30.5 pg (ref 26.0–34.0)
MCHC: 31.7 g/dL (ref 30.0–36.0)
MCV: 96.2 fL (ref 80.0–100.0)
Monocytes Absolute: 0.3 10*3/uL (ref 0.1–1.0)
Monocytes Relative: 8 %
Neutro Abs: 2.5 10*3/uL (ref 1.7–7.7)
Neutrophils Relative %: 61 %
Platelets: 174 10*3/uL (ref 150–400)
RBC: 4.26 MIL/uL (ref 3.87–5.11)
RDW: 12.4 % (ref 11.5–15.5)
WBC: 4.1 10*3/uL (ref 4.0–10.5)
nRBC: 0 % (ref 0.0–0.2)

## 2019-10-22 LAB — CUP PACEART REMOTE DEVICE CHECK
Battery Voltage: 2.62 V
Brady Statistic AP VP Percent: 0.01 %
Brady Statistic AP VS Percent: 42.08 %
Brady Statistic AS VP Percent: 0.02 %
Brady Statistic AS VS Percent: 57.88 %
Brady Statistic RA Percent Paced: 42.09 %
Brady Statistic RV Percent Paced: 0.03 %
Date Time Interrogation Session: 20210218021508
HighPow Impedance: 494 Ohm
HighPow Impedance: 54 Ohm
HighPow Impedance: 77 Ohm
Implantable Lead Implant Date: 19970917
Implantable Lead Implant Date: 20070119
Implantable Lead Location: 753859
Implantable Lead Location: 753860
Implantable Lead Model: 5076
Implantable Lead Model: 6942
Implantable Pulse Generator Implant Date: 20130722
Lead Channel Impedance Value: 456 Ohm
Lead Channel Impedance Value: 494 Ohm
Lead Channel Pacing Threshold Amplitude: 0.625 V
Lead Channel Pacing Threshold Amplitude: 0.75 V
Lead Channel Pacing Threshold Pulse Width: 0.4 ms
Lead Channel Pacing Threshold Pulse Width: 0.4 ms
Lead Channel Sensing Intrinsic Amplitude: 1.875 mV
Lead Channel Sensing Intrinsic Amplitude: 11 mV
Lead Channel Sensing Intrinsic Amplitude: 12.25 mV
Lead Channel Sensing Intrinsic Amplitude: 2.125 mV
Lead Channel Setting Pacing Amplitude: 2 V
Lead Channel Setting Pacing Amplitude: 2.5 V
Lead Channel Setting Pacing Pulse Width: 0.4 ms
Lead Channel Setting Sensing Sensitivity: 0.45 mV

## 2019-10-22 LAB — BASIC METABOLIC PANEL
Anion gap: 11 (ref 5–15)
BUN: 18 mg/dL (ref 8–23)
CO2: 25 mmol/L (ref 22–32)
Calcium: 9.6 mg/dL (ref 8.9–10.3)
Chloride: 102 mmol/L (ref 98–111)
Creatinine, Ser: 0.94 mg/dL (ref 0.44–1.00)
GFR calc Af Amer: >60 mL/min (ref >60)
GFR calc non Af Amer: >60 mL/min (ref >60)
Glucose, Bld: 115 mg/dL — ABNORMAL HIGH (ref 70–99)
Potassium: 4.5 mmol/L (ref 3.5–5.1)
Sodium: 138 mmol/L (ref 135–145)

## 2019-10-22 LAB — SARS CORONAVIRUS 2 (TAT 6-24 HRS): SARS Coronavirus 2: NEGATIVE

## 2019-10-23 NOTE — Progress Notes (Signed)
EPIC Encounter for ICM Monitoring  Patient Name: Nicole Swanson is a 64 y.o. female Date: 10/23/2019 Primary Care Physican: [name], MD Primary Cardiologist:Klein Electrophysiologist:Klein 10/23/2019 Weight: 155lbs  RRT (22-Oct-2019): REPLACE DEVICE. Less than 3 months to EOS.    Heart Failure questions reviewed. Cardiac cath and battery replacement scheduled for 2/22.  Cardiac cath is being done due to the SOB.     Optivol thoracic impedancenormal.  Taking Furosemide 2-3 times a week.  Prescribed: Furosemide20 mgTake 1 tablet (20 mg) by mouth 2-3 times per week as needed for increased shortness of breath/swelling. She has been taking Furosemide 2-3 times a week.  Labs: 02/24/2019 Creatinine0.85, BUN16, Potassium4.5, Sodium141, [id number] A complete set of results can be found in Results Review.  Recommendations: No changes and encouraged to call if experiencing any fluid symptoms.  Advised will be 6 weeks to recheck fluid levels due to new device will be developing new baseline.   Follow-up plan: ICM clinic phone appointment on4/01/2020.   Copy of ICM check sent to [place]   3 month ICM trend: 10/19/2019    1 Year ICM trend:       [name], RN 10/23/2019 2:47 PM

## 2019-10-26 ENCOUNTER — Ambulatory Visit (HOSPITAL_COMMUNITY)
Admission: RE | Disposition: A | Discharge status: Home / Self Care | Payer: Medicare HMO | Source: Home / Self Care | Attending: Internal Medicine

## 2019-10-26 ENCOUNTER — Ambulatory Visit (HOSPITAL_COMMUNITY)
Admission: RE | Admit: 2019-10-26 | Discharge: 2019-10-26 | Disposition: A | Discharge status: Home / Self Care | Payer: Medicare HMO | Attending: Internal Medicine | Admitting: Internal Medicine

## 2019-10-26 ENCOUNTER — Encounter (HOSPITAL_COMMUNITY)
Admission: RE | Disposition: A | Discharge status: Home / Self Care | Payer: Self-pay | Source: Home / Self Care | Attending: Internal Medicine

## 2019-10-26 ENCOUNTER — Other Ambulatory Visit: Payer: Self-pay

## 2019-10-26 DIAGNOSIS — K589 Irritable bowel syndrome without diarrhea: Secondary | ICD-10-CM | POA: Insufficient documentation

## 2019-10-26 DIAGNOSIS — Z87891 Personal history of nicotine dependence: Secondary | ICD-10-CM | POA: Insufficient documentation

## 2019-10-26 DIAGNOSIS — Z882 Allergy status to sulfonamides status: Secondary | ICD-10-CM | POA: Insufficient documentation

## 2019-10-26 DIAGNOSIS — Z79899 Other long term (current) drug therapy: Secondary | ICD-10-CM | POA: Insufficient documentation

## 2019-10-26 DIAGNOSIS — Z8249 Family history of ischemic heart disease and other diseases of the circulatory system: Secondary | ICD-10-CM | POA: Diagnosis not present

## 2019-10-26 DIAGNOSIS — Z8674 Personal history of sudden cardiac arrest: Secondary | ICD-10-CM | POA: Insufficient documentation

## 2019-10-26 DIAGNOSIS — I5030 Unspecified diastolic (congestive) heart failure: Secondary | ICD-10-CM | POA: Diagnosis not present

## 2019-10-26 DIAGNOSIS — R0609 Other forms of dyspnea: Secondary | ICD-10-CM | POA: Insufficient documentation

## 2019-10-26 DIAGNOSIS — M1990 Unspecified osteoarthritis, unspecified site: Secondary | ICD-10-CM | POA: Insufficient documentation

## 2019-10-26 DIAGNOSIS — Z881 Allergy status to other antibiotic agents status: Secondary | ICD-10-CM | POA: Diagnosis not present

## 2019-10-26 DIAGNOSIS — Z006 Encounter for examination for normal comparison and control in clinical research program: Secondary | ICD-10-CM | POA: Insufficient documentation

## 2019-10-26 DIAGNOSIS — Z880 Allergy status to penicillin: Secondary | ICD-10-CM | POA: Diagnosis not present

## 2019-10-26 DIAGNOSIS — F329 Major depressive disorder, single episode, unspecified: Secondary | ICD-10-CM | POA: Insufficient documentation

## 2019-10-26 DIAGNOSIS — I493 Ventricular premature depolarization: Secondary | ICD-10-CM | POA: Insufficient documentation

## 2019-10-26 DIAGNOSIS — I110 Hypertensive heart disease with heart failure: Secondary | ICD-10-CM | POA: Diagnosis not present

## 2019-10-26 DIAGNOSIS — I2510 Atherosclerotic heart disease of native coronary artery without angina pectoris: Secondary | ICD-10-CM | POA: Insufficient documentation

## 2019-10-26 DIAGNOSIS — Z4502 Encounter for adjustment and management of automatic implantable cardiac defibrillator: Secondary | ICD-10-CM | POA: Diagnosis not present

## 2019-10-26 DIAGNOSIS — R0600 Dyspnea, unspecified: Secondary | ICD-10-CM

## 2019-10-26 DIAGNOSIS — Z86718 Personal history of other venous thrombosis and embolism: Secondary | ICD-10-CM | POA: Insufficient documentation

## 2019-10-26 DIAGNOSIS — J449 Chronic obstructive pulmonary disease, unspecified: Secondary | ICD-10-CM | POA: Diagnosis not present

## 2019-10-26 DIAGNOSIS — Z7982 Long term (current) use of aspirin: Secondary | ICD-10-CM | POA: Diagnosis not present

## 2019-10-26 DIAGNOSIS — Z888 Allergy status to other drugs, medicaments and biological substances status: Secondary | ICD-10-CM | POA: Insufficient documentation

## 2019-10-26 DIAGNOSIS — I7300 Raynaud's syndrome without gangrene: Secondary | ICD-10-CM | POA: Insufficient documentation

## 2019-10-26 DIAGNOSIS — I472 Ventricular tachycardia: Secondary | ICD-10-CM

## 2019-10-26 HISTORY — PX: RIGHT/LEFT HEART CATH AND CORONARY ANGIOGRAPHY: CATH118266

## 2019-10-26 HISTORY — PX: ICD GENERATOR CHANGEOUT: EP1231

## 2019-10-26 LAB — POCT I-STAT 7, (LYTES, BLD GAS, ICA,H+H)
Acid-base deficit: 1 mmol/L (ref 0.0–2.0)
Bicarbonate: 24.4 mmol/L (ref 20.0–28.0)
Calcium, Ion: 1.08 mmol/L — ABNORMAL LOW (ref 1.15–1.40)
HCT: 35 % — ABNORMAL LOW (ref 36.0–46.0)
Hemoglobin: 11.9 g/dL — ABNORMAL LOW (ref 12.0–15.0)
O2 Saturation: 98 %
Potassium: 3.4 mmol/L — ABNORMAL LOW (ref 3.5–5.1)
Sodium: 142 mmol/L (ref 135–145)
TCO2: 26 mmol/L (ref 22–32)
pCO2 arterial: 40.5 mmHg (ref 32.0–48.0)
pH, Arterial: 7.388 (ref 7.350–7.450)
pO2, Arterial: 100 mmHg (ref 83.0–108.0)

## 2019-10-26 LAB — POCT I-STAT EG7
Acid-Base Excess: 1 mmol/L (ref 0.0–2.0)
Acid-base deficit: 1 mmol/L (ref 0.0–2.0)
Bicarbonate: 24.4 mmol/L (ref 20.0–28.0)
Bicarbonate: 25.6 mmol/L (ref 20.0–28.0)
Bicarbonate: 26.7 mmol/L (ref 20.0–28.0)
Calcium, Ion: 1.12 mmol/L — ABNORMAL LOW (ref 1.15–1.40)
Calcium, Ion: 1.12 mmol/L — ABNORMAL LOW (ref 1.15–1.40)
Calcium, Ion: 1.25 mmol/L (ref 1.15–1.40)
HCT: 35 % — ABNORMAL LOW (ref 36.0–46.0)
HCT: 37 % (ref 36.0–46.0)
HCT: 38 % (ref 36.0–46.0)
Hemoglobin: 11.9 g/dL — ABNORMAL LOW (ref 12.0–15.0)
Hemoglobin: 12.6 g/dL (ref 12.0–15.0)
Hemoglobin: 12.9 g/dL (ref 12.0–15.0)
O2 Saturation: 74 %
O2 Saturation: 80 %
O2 Saturation: 75 %
Potassium: 3.4 mmol/L — ABNORMAL LOW (ref 3.5–5.1)
Potassium: 3.6 mmol/L (ref 3.5–5.1)
Potassium: 3.7 mmol/L (ref 3.5–5.1)
Sodium: 137 mmol/L (ref 135–145)
Sodium: 140 mmol/L (ref 135–145)
Sodium: 139 mmol/L (ref 135–145)
TCO2: 26 mmol/L (ref 22–32)
TCO2: 27 mmol/L (ref 22–32)
TCO2: 28 mmol/L (ref 22–32)
pCO2, Ven: 43.8 mmHg — ABNORMAL LOW (ref 44.0–60.0)
pCO2, Ven: 44.5 mmHg (ref 44.0–60.0)
pCO2, Ven: 44.8 mmHg (ref 44.0–60.0)
pH, Ven: 7.353 (ref 7.250–7.430)
pH, Ven: 7.368 (ref 7.250–7.430)
pH, Ven: 7.382 (ref 7.250–7.430)
pO2, Ven: 41 mmHg (ref 32.0–45.0)
pO2, Ven: 46 mmHg — ABNORMAL HIGH (ref 32.0–45.0)
pO2, Ven: 41 mmHg (ref 32.0–45.0)

## 2019-10-26 SURGERY — RIGHT/LEFT HEART CATH AND CORONARY ANGIOGRAPHY
Anesthesia: LOCAL

## 2019-10-26 SURGERY — ICD GENERATOR CHANGEOUT
Anesthesia: LOCAL

## 2019-10-26 MED ORDER — ASPIRIN 81 MG PO CHEW: 81.0000 mg | CHEWABLE_TABLET | ORAL | Status: DC

## 2019-10-26 MED ORDER — SODIUM CHLORIDE 0.9% FLUSH: 3.0000 mL | Freq: Two times a day (BID) | INTRAVENOUS | Status: DC

## 2019-10-26 MED ORDER — MIDAZOLAM HCL 5 MG/5ML IJ SOLN
INTRAMUSCULAR | Status: AC
  Filled 2019-10-26: qty 5

## 2019-10-26 MED ORDER — SODIUM CHLORIDE 0.9 % IV SOLN
INTRAVENOUS | Status: AC
  Filled 2019-10-26: qty 2

## 2019-10-26 MED ORDER — MIDAZOLAM HCL 2 MG/2ML IJ SOLN
INTRAMUSCULAR | Status: DC | PRN
  Administered 2019-10-26: 2 mg via INTRAVENOUS

## 2019-10-26 MED ORDER — HEPARIN SODIUM (PORCINE) 1000 UNIT/ML IJ SOLN
INTRAMUSCULAR | Status: AC
  Filled 2019-10-26: qty 1

## 2019-10-26 MED ORDER — SODIUM CHLORIDE 0.9 % IV SOLN: 250.0000 mL | INTRAVENOUS | Status: DC | PRN

## 2019-10-26 MED ORDER — VERAPAMIL HCL 2.5 MG/ML IV SOLN
INTRAVENOUS | Status: DC | PRN
  Administered 2019-10-26: 10 mL via INTRA_ARTERIAL

## 2019-10-26 MED ORDER — CHLORHEXIDINE GLUCONATE 4 % EX LIQD: 4.0000 "application " | Freq: Once | CUTANEOUS | Status: DC

## 2019-10-26 MED ORDER — LIDOCAINE HCL 1 % IJ SOLN
INTRAMUSCULAR | Status: AC
  Filled 2019-10-26: qty 20

## 2019-10-26 MED ORDER — MIDAZOLAM HCL 5 MG/5ML IJ SOLN
INTRAMUSCULAR | Status: DC | PRN
  Administered 2019-10-26: 1 mg via INTRAVENOUS
  Administered 2019-10-26: 2 mg via INTRAVENOUS
  Administered 2019-10-26: 1 mg via INTRAVENOUS

## 2019-10-26 MED ORDER — ACETAMINOPHEN 325 MG PO TABS
325.0000 mg | ORAL_TABLET | ORAL | Status: DC | PRN
  Filled 2019-10-26: qty 2

## 2019-10-26 MED ORDER — IOHEXOL 350 MG/ML SOLN
INTRAVENOUS | Status: DC | PRN
  Administered 2019-10-26: 13:00:00 60 mL

## 2019-10-26 MED ORDER — SODIUM CHLORIDE 0.9% FLUSH: 3.0000 mL | INTRAVENOUS | Status: DC | PRN

## 2019-10-26 MED ORDER — VERAPAMIL HCL 2.5 MG/ML IV SOLN
INTRAVENOUS | Status: AC
  Filled 2019-10-26: qty 2

## 2019-10-26 MED ORDER — CLINDAMYCIN PHOSPHATE 900 MG/50ML IV SOLN
900.0000 mg | INTRAVENOUS | Status: AC
  Administered 2019-10-26: 900 mg via INTRAVENOUS
  Filled 2019-10-26: qty 50

## 2019-10-26 MED ORDER — FENTANYL CITRATE (PF) 100 MCG/2ML IJ SOLN
INTRAMUSCULAR | Status: AC
  Filled 2019-10-26: qty 2

## 2019-10-26 MED ORDER — HEPARIN (PORCINE) IN NACL 1000-0.9 UT/500ML-% IV SOLN
INTRAVENOUS | Status: DC | PRN
  Administered 2019-10-26: 500 mL

## 2019-10-26 MED ORDER — FENTANYL CITRATE (PF) 100 MCG/2ML IJ SOLN
INTRAMUSCULAR | Status: DC | PRN
  Administered 2019-10-26 (×2): 25 ug via INTRAVENOUS

## 2019-10-26 MED ORDER — HEPARIN (PORCINE) IN NACL 1000-0.9 UT/500ML-% IV SOLN
INTRAVENOUS | Status: AC
  Filled 2019-10-26: qty 1000

## 2019-10-26 MED ORDER — LIDOCAINE HCL (PF) 1 % IJ SOLN
INTRAMUSCULAR | Status: AC
  Filled 2019-10-26: qty 30

## 2019-10-26 MED ORDER — HEPARIN SODIUM (PORCINE) 1000 UNIT/ML IJ SOLN
INTRAMUSCULAR | Status: DC | PRN
  Administered 2019-10-26: 3500 [IU] via INTRAVENOUS

## 2019-10-26 MED ORDER — FENTANYL CITRATE (PF) 100 MCG/2ML IJ SOLN
INTRAMUSCULAR | Status: DC | PRN
  Administered 2019-10-26: 25 ug via INTRAVENOUS

## 2019-10-26 MED ORDER — LIDOCAINE HCL (PF) 1 % IJ SOLN
INTRAMUSCULAR | Status: DC | PRN
  Administered 2019-10-26: 1 mL
  Administered 2019-10-26: 2 mL

## 2019-10-26 MED ORDER — LIDOCAINE HCL (PF) 1 % IJ SOLN
INTRAMUSCULAR | Status: DC | PRN
  Administered 2019-10-26: 60 mL

## 2019-10-26 MED ORDER — SODIUM CHLORIDE 0.9 % IV SOLN: 80.0000 mg | INTRAVENOUS | Status: DC

## 2019-10-26 MED ORDER — SODIUM CHLORIDE 0.9 % IV SOLN: INTRAVENOUS | Status: DC

## 2019-10-26 MED ORDER — ONDANSETRON HCL 4 MG/2ML IJ SOLN: 4.0000 mg | Freq: Four times a day (QID) | INTRAMUSCULAR | Status: DC | PRN

## 2019-10-26 MED ORDER — MIDAZOLAM HCL 2 MG/2ML IJ SOLN
INTRAMUSCULAR | Status: AC
  Filled 2019-10-26: qty 2

## 2019-10-26 MED ORDER — LINEZOLID 600 MG/300ML IV SOLN
600.0000 mg | INTRAVENOUS | Status: AC
  Administered 2019-10-26: 600 mg via INTRAVENOUS
  Filled 2019-10-26: qty 300

## 2019-10-26 SURGICAL SUPPLY — 9 items
CATH 5FR JL3.5 JR4 ANG PIG MP (CATHETERS) ×2 IMPLANT
CATH BALLN WEDGE 5F 110CM (CATHETERS) ×2 IMPLANT
DEVICE RAD COMP TR BAND LRG (VASCULAR PRODUCTS) ×2 IMPLANT
GLIDESHEATH SLEND SS 6F .021 (SHEATH) ×2 IMPLANT
GUIDEWIRE INQWIRE 1.5J.035X260 (WIRE) ×1 IMPLANT
INQWIRE 1.5J .035X260CM (WIRE) ×2
PACK CARDIAC CATHETERIZATION (CUSTOM PROCEDURE TRAY) ×2 IMPLANT
SHEATH GLIDE SLENDER 4/5FR (SHEATH) ×2 IMPLANT
TRANSDUCER W/STOPCOCK (MISCELLANEOUS) ×2 IMPLANT

## 2019-10-26 SURGICAL SUPPLY — 7 items
CABLE SURGICAL S-101-97-12 (CABLE) ×2 IMPLANT
HEMOSTAT SURGICEL 2X4 FIBR (HEMOSTASIS) ×2 IMPLANT
ICD EVERA XT MRI DF1  DDMB1D1 (ICD Generator) ×1 IMPLANT
ICD EVERA XT MRI DF1 DDMB1D1 (ICD Generator) ×1 IMPLANT
PAD PRO RADIOLUCENT 2001M-C (PAD) ×2 IMPLANT
POUCH AIGIS-R ANTIBACT ICD (Mesh General) ×2 IMPLANT
TRAY PACEMAKER INSERTION (PACKS) ×2 IMPLANT

## 2019-10-26 NOTE — Discharge Instructions (Signed)
DRINK PLENTY OF FLUIDS FOR THE NEXT 2-3 DAYS.  KEEP ARM ELEVATED THE REMAINDER OF THE DAY.  Radial Site Care  This sheet gives you information about how to care for yourself after your procedure. Your health care provider may also give you more specific instructions. If you have problems or questions, contact your health care provider. What can I expect after the procedure? After the procedure, it is common to have:  Bruising and tenderness at the catheter insertion area. Follow these instructions at home: Medicines  Take over-the-counter and prescription medicines only as told by your health care provider. Insertion site care 1. Follow instructions from your health care provider about how to take care of your insertion site. Make sure you: ? Wash your hands with soap and water before you change your bandage (dressing). If soap and water are not available, use hand sanitizer. ? Change your dressing as told by your health care provider. 2. Check your insertion site every day for signs of infection. Check for: ? Redness, swelling, or pain. ? Fluid or blood. ? Pus or a bad smell. ? Warmth. 3. Do not take baths, swim, or use a hot tub for 5 days. 4. You may shower 24-48 hours after the procedure. ? Remove the dressing and gently wash the site with plain soap and water. ? Pat the area dry with a clean towel. ? Do not rub the site. That could cause bleeding. 5. Do not apply powder or lotion to the site. Activity  1. For 24 hours after the procedure, or as directed by your health care provider: ? Do not flex or bend the affected arm. ? Do not push or pull heavy objects with the affected arm. ? Do not drive yourself home from the hospital or clinic. You may drive 24 hours after the procedure. ? Do not operate machinery or power tools. 2. Do not push, pull or lift anything that is heavier than 10 lb for 5 days. 3. Ask your health care provider when it is okay to: ? Return to work  or school. ? Resume usual physical activities or sports. ? Resume sexual activity. General instructions  If the catheter site starts to bleed, raise your arm and put firm pressure on the site. If the bleeding does not stop, get help right away. This is a medical emergency.  If you went home on the same day as your procedure, a responsible adult should be with you for the first 24 hours after you arrive home.  Keep all follow-up visits as told by your health care provider. This is important. Contact a health care provider if:  You have a fever.  You have redness, swelling, or yellow drainage around your insertion site. Get help right away if:  You have unusual pain at the radial site.  The catheter insertion area swells very fast.  The insertion area is bleeding, and the bleeding does not stop when you hold steady pressure on the area.  Your arm or hand becomes pale, cool, tingly, or numb. These symptoms may represent a serious problem that is an emergency. Do not wait to see if the symptoms will go away. Get medical help right away. Call your local emergency services (911 in the [place] Do not drive yourself to the hospital. Summary  After the procedure, it is common to have bruising and tenderness at the site.  Follow instructions from your health care provider about how to take care of your radial site  wound. Check the wound every day for signs of infection.  Do not push, pull or lift anything that is heavier than 10 lb for 5 days.  This information is not intended to replace advice given to you by your health care provider. Make sure you discuss any questions you have with your health care provider. Document Revised: 09/25/2017 Document Reviewed: 09/25/2017 Elsevier Patient Education  2020 [place] Cardiac Device Battery Change, Care After   Dermabond will come off in next 10-14 days; if not will remove at wound check  Keep wound dry until tomorrow  evening  No Driving x 4 days  Wound check in office as scheduled    This sheet gives you information about how to care for yourself after your procedure. Your health care provider may also give you more specific instructions. If you have problems or questions, contact your health care provider. What can I expect after the procedure? After your procedure, it is common to have:  Pain or soreness at the site where the cardiac device was inserted.  Swelling at the site where the cardiac device was inserted.  You should received an information card for your new device in 4-8 weeks. Follow these instructions at home: Incision care   Keep the incision clean and dry. ? Do not take baths, swim, or use a hot tub until after your wound check.  ? Do not shower for at least 24 hours. ? Pat the area dry with a clean towel. Do not rub the area. This may cause bleeding.  Follow instructions from your health care provider about how to take care of your incision. Make sure you:  Leave skin glue in place.   Check your incision area every day for signs of infection. Check for: ? More redness, swelling, or pain. ? More fluid or blood. ? Warmth. ? Pus or a bad smell. Activity  Do not lift anything that is heavier than 10 lb (4.5 kg) until your health care provider says it is okay to do so.  For the first week, or as long as told by your health care provider: ? Avoid lifting your affected arm higher than your shoulder. ? After 1 week, Be gentle when you move your arms over your head. It is okay to raise your arm to comb your hair. ? Avoid strenuous exercise.  Ask your health care provider when it is okay to: ? Resume your normal activities. ? Return to work or school. ? Resume sexual activity. Eating and drinking  Eat a heart-healthy diet. This should include plenty of fresh fruits and vegetables, whole grains, low-fat dairy products, and lean protein like chicken and fish.  Limit alcohol  intake to no more than 1 drink a day for non-pregnant women and 2 drinks a day for men. One drink equals 12 oz of beer, 5 oz of wine, or 1 oz of hard liquor.  Check ingredients and nutrition facts on packaged foods and beverages. Avoid the following types of food: ? Food that is high in salt (sodium). ? Food that is high in saturated fat, like full-fat dairy or red meat. ? Food that is high in trans fat, like fried food. ? Food and drinks that are high in sugar. Lifestyle  Do not use any products that contain nicotine or tobacco, such as cigarettes and e-cigarettes. If you need help quitting, ask your health care provider.  Take steps to manage and control your weight.  Once cleared, get  regular exercise. Aim for 150 minutes of moderate-intensity exercise (such as walking or yoga) or 75 minutes of vigorous exercise (such as running or swimming) each week.  Manage other health problems, such as diabetes or high blood pressure. Ask your health care provider how you can manage these conditions. General instructions  Do not drive for 4 days after your procedure.  Take over-the-counter and prescription medicines only as told by your health care provider.  Avoid putting pressure on the area where the cardiac device was placed.  If you need an MRI after your cardiac device has been placed, be sure to tell the health care provider who orders the MRI that you have a cardiac device.  Avoid close and prolonged exposure to electrical devices that have strong magnetic fields. These include: ? Cell phones. Avoid keeping them in a pocket near the cardiac device, and try using the ear opposite the cardiac device. ? MP3 players. ? Household appliances, like microwaves. ? Metal detectors. ? Electric generators. ? High-tension wires.  Keep all follow-up visits as directed by your health care provider. This is important. Contact a health care provider if:  You have pain at the incision site that is  not relieved by over-the-counter or prescription medicines.  You have any of these around your incision site or coming from it: ? More redness, swelling, or pain. ? Fluid or blood. ? Warmth to the touch. ? Pus or a bad smell.  You have a fever.  You feel brief, occasional palpitations, light-headedness, or any symptoms that you think might be related to your heart. Get help right away if:  You experience chest pain that is different from the pain at the cardiac device site.  You develop a red streak that extends above or below the incision site.  You experience shortness of breath.  You have palpitations or an irregular heartbeat.  You have light-headedness that does not go away quickly.  You faint or have dizzy spells.  Your pulse suddenly drops or increases rapidly and does not return to normal.  You begin to gain weight and your legs and ankles swell. Summary  After your procedure, it is common to have pain, soreness, and some swelling where the cardiac device was inserted.  Make sure to keep your incision clean and dry. Follow instructions from your health care provider about how to take care of your incision.  Check your incision every day for signs of infection, such as more pain or swelling, pus or a bad smell, warmth, or leaking fluid and blood.  Avoid strenuous exercise and lifting your left arm higher than your shoulder for 2 weeks, or as long as told by your health care provider. This information is not intended to replace advice given to you by your health care provider. Make sure you discuss any questions you have with your health care provider.

## 2019-10-26 NOTE — Progress Notes (Signed)
[facility] BAND REMOVAL  LOCATION:    right radial  DEFLATED PER PROTOCOL:    Yes.    TIME BAND OFF / DRESSING APPLIED:    Band was left on due to a pending ICD change out.  SITE UPON ARRIVAL:    Level 0  SITE AFTER BAND REMOVAL:    Level 0  CIRCULATION SENSATION AND MOVEMENT:    Within Normal Limits   Yes.    COMMENTS:   R radiale pulse +1,, Capp[place] refill < 3 sec.

## 2019-10-26 NOTE — Interval H&P Note (Signed)
History and Physical Interval Note:  10/26/2019 12:34 PM  Nicole Swanson  has presented today for surgery, with the diagnosis of shortness of breath.  The various methods of treatment have been discussed with the patient and family. After consideration of risks, benefits and other options for treatment, the patient has consented to  Procedure(s): RIGHT/LEFT HEART CATH AND CORONARY ANGIOGRAPHY (N/A) and possible coronary angioplasty as a surgical intervention.  The patient's history has been reviewed, patient examined, no change in status, stable for surgery.  I have reviewed the patient's chart and labs.  Questions were answered to the patient's satisfaction.     [name]

## 2019-10-26 NOTE — H&P (Signed)
Patient Care Team: [name], MD as PCP - General (Internal Medicine)   HPI  Nicole DOUBLEDAY is a 64 y.o. female admitted with dyspnea for R and L heart cath and for ICD generator change as her device voltage =< ERI voltage  She also has a history of ventricular ectopy for which she has been on sotalol in which Dr. Sharon Seller was going to increase prior to her transferring her care to [place] implanted originally 1997, generator change x 2 most recent 2014-- developed brady dependent ICD shocks early on prompting a dual chamber upgrade   Her husband died 38 .     Has been struggling with dyspnea, particularly since returning from [place] 3/21--seen pulm and also underwent CPX concerning given lack of BP response; also pulm HTN  Recently started on cymbalta and welbutrin and is feeling considerably better    DATE TEST EF   10/17    Myoview  65 % No ischemia  10/17 Echo  65%   5/20 Echo  55-60% E/E' <10     Date Cr Mg K  4/15  0.9 2.4    10/15  0.9   4.5  11/17 0.7  3.9  2/18 0.64  4.0  5/18 0.79 2.5 4.5  11/18 0.92  4.3  6/19 0.87 2.5 4.1  6/20 0.85  4.5  2/18 0.94  4.5     Past Medical History:  Diagnosis Date  . Aborted cardiac arrest 1994  . AICD (automatic cardioverter/defibrillator) present   . Allergy   . Anginal pain ([place])   . Arthritis   . Asthma    hx of asthma related to air bag release   . Dual implantable cardiac defibrillator    Medtronic initially implanted in 1997  . DVT (deep vein thrombosis) in pregnancy 1986   had heparin shots followed by coumadin  . Fall    10/11/17 walking dogs   . Family history of seizures    Question neurological versus cardiac  . Heart murmur   . Irritable bowel syndrome   . 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 PONV (postoperative nausea and vomiting)   . Presence of permanent cardiac pacemaker   . PVC's (premature ventricular contractions)   . Raynaud's syndrome     . Shortness of breath dyspnea    with exertion   . Ventricular tachycardia [facility])     Past Surgical History:  Procedure Laterality Date  . ABDOMINAL HYSTERECTOMY    . APPENDECTOMY    . CARDIAC DEFIBRILLATOR PLACEMENT  1994   Dual chamber with pacemaker  . CARPAL TUNNEL RELEASE Right   . COLONOSCOPY WITH PROPOFOL N/A 01/18/2017   Procedure: COLONOSCOPY WITH PROPOFOL;  Surgeon: Manya Silvas, MD;  Location: [facility] ENDOSCOPY;  Service: Endoscopy;  Laterality: N/A;  . CYSTOCELE REPAIR N/A 06/19/2016   Procedure: ANTERIOR REPAIR (CYSTOCELE) WITH VAULT PROLAPSE;  Surgeon: Bjorn Loser, MD;  Location: [facility] ORS;  Service: Urology;  Laterality: N/A;  . CYSTOSCOPY N/A 06/19/2016   Procedure: CYSTOSCOPY;  Surgeon: Bjorn Loser, MD;  Location: [facility] ORS;  Service: Urology;  Laterality: N/A;  . ESOPHAGOGASTRODUODENOSCOPY (EGD) WITH PROPOFOL N/A 01/18/2017   Procedure: ESOPHAGOGASTRODUODENOSCOPY (EGD) WITH PROPOFOL;  Surgeon: Manya Silvas, MD;  Location[facility] ENDOSCOPY;  Service: Endoscopy;  Laterality: N/A;  . LAPAROSCOPIC LYSIS OF ADHESIONS    . NASAL SEPTUM SURGERY  [address]   . OOPHORECTOMY    .  PACEMAKER INSERTION     5 different devices    Current Facility-Administered Medications  Medication Dose Route Frequency Provider Last Rate Last Admin  . 0.9 %  sodium chloride infusion  250 mL Intravenous PRN Bensimhon, Shaune Pascal, MD      . 0.9 %  sodium chloride infusion   Intravenous Continuous Deboraha Sprang, MD      . aspirin chewable tablet 81 mg  81 mg Oral Pre-Cath Bensimhon, Shaune Pascal, MD      . chlorhexidine (HIBICLENS) 4 % liquid 4 application  4 application Topical Once Deboraha Sprang, MD      . clindamycin (CLEOCIN) IVPB 900 mg  900 mg Intravenous to SS-Proc Deboraha Sprang, MD 100 mL/hr at 10/26/19 1112 900 mg at 10/26/19 1112  . gentamicin (GARAMYCIN) 80 mg in sodium chloride 0.9 % 500 mL irrigation  80 mg Irrigation On Call Deboraha Sprang, MD      . sodium  chloride flush (NS) 0.9 % injection 3 mL  3 mL Intravenous Q12H Bensimhon, Shaune Pascal, MD      . sodium chloride flush (NS) 0.9 % injection 3 mL  3 mL Intravenous PRN Bensimhon, Shaune Pascal, MD        Allergies  Allergen Reactions  . Vancomycin Rash    Other Reaction: RASH & WELTS  . Prednisone     High dose causes rash and itching  . Penicillins Rash    Has patient had a PCN reaction causing immediate rash, facial/tongue/throat swelling, SOB or lightheadedness with hypotension: No Has patient had a PCN reaction causing severe rash involving mucus membranes or skin necrosis: No Has patient had a PCN reaction that required hospitalization: No Has patient had a PCN reaction occurring within the last 10 years: No If all of the above answers are "NO", then may proceed with Cephalosporin use.   . Sulfa Antibiotics Diarrhea      Social History   Tobacco Use  . Smoking status: Former Smoker    Types: Cigarettes    Quit date: 06/04/1995    Years since quitting: 24.4  . Smokeless tobacco: Never Used  Substance Use Topics  . Alcohol use: Yes    Alcohol/week: 0.0 standard drinks    Comment: occasional  . Drug use: No     Family History  Problem Relation Age of Onset  . Heart disease Mother 32       A Fib, CHF  . Stroke Mother   . Heart disease Father   . Stroke Father   . Seizures Brother   . Seizures Daughter   . Breast cancer Neg Hx      Current Meds  Medication Sig  . buPROPion (WELLBUTRIN) 75 MG tablet Take 1 tablet (75 mg total) by mouth 2 (two) times daily.  . Cholecalciferol (VITAMIN D) 50 MCG (2000 UT) CAPS Take 2,000 Units by mouth at bedtime.   . Cranberry 360 MG CAPS Take 360 mg by mouth daily. Calcium 90 Thera Cran 1  . docusate sodium (COLACE) 100 MG capsule Take 100 mg by mouth 2 (two) times daily.   . DULoxetine (CYMBALTA) 30 MG capsule Take 1 capsule (30 mg total) by mouth daily with supper.  . fluticasone (FLONASE) 50 MCG/ACT nasal spray Place 1 spray into both  nostrils daily.  . furosemide (LASIX) 20 MG tablet Take 1 tablet (20 mg) by mouth 2-3 times per week as needed for increased shortness of breath/ swelling (Patient taking differently: Take 20  mg by mouth See admin instructions. Take 1 tablet (20 mg) by mouth 2-3 times per week as needed for increased shortness of breath/ swelling)  . gabapentin (NEURONTIN) 100 MG capsule Take 1 capsule (100 mg total) by mouth 3 (three) times daily. (Patient taking differently: Take 100 mg by mouth at bedtime. )  . loratadine (CLARITIN) 10 MG tablet Take 10 mg by mouth daily.  . montelukast (SINGULAIR) 10 MG tablet TAKE 1 TABLET AT BEDTIME (Patient taking differently: Take 10 mg by mouth at bedtime. )  . Multiple Vitamins-Minerals (MULTIVITAMIN WITH MINERALS) tablet Take 1 tablet by mouth daily. Centrum Silver  . omeprazole (PRILOSEC) 40 MG capsule Take 1 capsule (40 mg total) by mouth 2 (two) times daily. (Patient taking differently: Take 40 mg by mouth daily. )  . ondansetron (ZOFRAN) 4 MG tablet Take 1 tablet (4 mg total) by mouth every 8 (eight) hours as needed for nausea or vomiting.  . senna (SENOKOT) 8.6 MG tablet Take 1 tablet by mouth daily as needed for constipation.   . sotalol (BETAPACE) 120 MG tablet TAKE 1 TABLET 2 TIMES DAILY (Patient taking differently: Take 120 mg by mouth 2 (two) times daily. )     Review of Systems negative except from HPI and PMH  Physical Exam BP (!) 143/89   Pulse 91   Temp (!) 97.3 F (36.3 C) (Skin)   Resp 14   Ht 5\' 7"  (1.702 m)   Wt 70.8 kg   SpO2 100%   BMI 24.43 kg/m  Well developed and well nourished in no acute distress HENT normal E scleral and icterus clear Neck Supple JVP 6-8 carotids brisk and full Clear to ausculation  Regular rate and rhythm, no murmurs gallops or rub Soft with active bowel sounds No clubbing cyanosis none Edema Alert and oriented, grossly normal motor and sensory function Skin Warm and Dry    Assessment and  Plan Aborted  cardiac arrest- recurrent with hx of appropriate shocks brady dependent   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  These include but are not limited to lead fracture and infection.  The patient understands, agrees and is willing to proceed.    Also for R and L heart cath with Dr Reine Just  Benefits and risks reviewed

## 2019-10-27 MED FILL — Lidocaine HCl Local Inj 1%: INTRAMUSCULAR | Qty: 60 | Status: AC

## 2019-10-27 MED FILL — Gentamicin Sulfate Inj 40 MG/ML: INTRAMUSCULAR | Qty: 80 | Status: AC

## 2019-10-27 NOTE — Progress Notes (Signed)
Instructed patient with the following information:  No driving for 4 days No shower till tomorrow. No lotion, ointments or creams on incision. Call the device clinic if you notice any drainage, swelling or fevers  Call device clinic for any questions [phone].

## 2019-10-28 ENCOUNTER — Ambulatory Visit: Payer: Medicare HMO

## 2019-10-28 MED ORDER — BUPROPION HCL 75 MG PO TABS: 75.0000 mg | ORAL_TABLET | Freq: Two times a day (BID) | ORAL | 1 refills | Status: AC

## 2019-11-03 NOTE — Progress Notes (Signed)
ICD Criteria  Current LVEF:60%. Within 12 months prior to implant: Yes   Heart failure history: Yes, Class II  Cardiomyopathy history: No.  Atrial Fibrillation/Atrial Flutter: No.  Ventricular tachycardia history: Yes, Hemodynamic instability present. VT Type is unknown.  Cardiac arrest history: Yes, Ventricular Fibrillation.  History of syndromes with risk of sudden death: No.  Previous ICD: Yes, Reason for ICD:  Secondary prevention.  Current ICD indication: Secondary  PPM indication: No.  Class I or II Bradycardia indication present: No  Beta Blocker therapy for 3 or more months: Yes, prescribed.   Ace Inhibitor/ARB therapy for 3 or more months: No, medical reason.   I have seen Nicole Swanson is a 64 y.o. femalepre-procedural and has been for  consideration of ICD implant for secondary prevention of sudden death.  The patient's chart has been reviewed and they meet criteria for ICD implant.  I have had a thorough discussion with the patient reviewing options.  The patient and their family (if available) have had opportunities to ask questions and have them answered. The patient and I have decided together through the [place] [employer] to reimplant * ICD at this time.  Risks, benefits, alternatives to ICD implantation were discussed in detail with the patient today. The patient  understands that the risks include but are not limited to bleeding, infection, pneumothorax, perforation, tamponade, vascular damage, renal failure, MI, stroke, death, inappropriate shocks, and lead dislodgement and  wishes to proceed.

## 2019-11-05 ENCOUNTER — Ambulatory Visit: Payer: Medicare HMO | Attending: Internal Medicine

## 2019-11-05 DIAGNOSIS — Z23 Encounter for immunization: Secondary | ICD-10-CM | POA: Insufficient documentation

## 2019-11-05 NOTE — Progress Notes (Signed)
   Covid-19 Vaccination Clinic  Name:  Nicole Swanson    MRN: [id number] DOB: 06-11-1956  11/05/2019  [name] was observed post Covid-19 immunization for 15 minutes without incident. She was provided with Vaccine Information Sheet and instruction to access the V-Safe system.   [name] was instructed to call 911 with any severe reactions post vaccine: Marland Kitchen Difficulty breathing  . Swelling of face and throat  . A fast heartbeat  . A bad rash all over body  . Dizziness and weakness   Immunizations Administered    Name Date Dose VIS Date Route   Pfizer COVID-19 Vaccine 11/05/2019 12:18 PM 0.3 mL 08/14/2019 Intramuscular   Manufacturer: [place]   Lot: [id number]   [place]: [id number]

## 2019-11-12 ENCOUNTER — Ambulatory Visit: Payer: Medicare HMO

## 2019-11-19 ENCOUNTER — Other Ambulatory Visit: Payer: Self-pay

## 2019-11-19 ENCOUNTER — Ambulatory Visit (INDEPENDENT_AMBULATORY_CARE_PROVIDER_SITE_OTHER): Payer: Medicare HMO | Admitting: *Deleted

## 2019-11-19 DIAGNOSIS — I5030 Unspecified diastolic (congestive) heart failure: Secondary | ICD-10-CM | POA: Diagnosis not present

## 2019-11-19 DIAGNOSIS — Z95810 Presence of automatic (implantable) cardiac defibrillator: Secondary | ICD-10-CM

## 2019-11-19 DIAGNOSIS — I472 Ventricular tachycardia, unspecified: Secondary | ICD-10-CM

## 2019-11-19 LAB — CUP PACEART INCLINIC DEVICE CHECK
Brady Statistic AP VP Percent: 0.1 % — CL
Brady Statistic AP VS Percent: 53.4 %
Brady Statistic AS VP Percent: 0.1 % — CL
Brady Statistic AS VS Percent: 46.6 %
Date Time Interrogation Session: 20210318150027
Implantable Lead Implant Date: 19970917
Implantable Lead Implant Date: 20070119
Implantable Lead Location: 753859
Implantable Lead Location: 753860
Implantable Lead Model: 5076
Implantable Lead Model: 6942
Implantable Pulse Generator Implant Date: 20130722
Lead Channel Pacing Threshold Amplitude: 0.5 V
Lead Channel Pacing Threshold Amplitude: 1 V
Lead Channel Pacing Threshold Pulse Width: 0.4 ms
Lead Channel Pacing Threshold Pulse Width: 0.4 ms
Lead Channel Sensing Intrinsic Amplitude: 15.1 mV
Lead Channel Sensing Intrinsic Amplitude: 2.1 mV

## 2019-11-19 NOTE — Progress Notes (Signed)
ICD check in clinic. Wound healed with edges well approximated. Dermabond removed from site prior to office visit. No redness, swelling or drainage noted. Normal device function. Thresholds and sensing consistent with previous device measurements. Impedance trends stable over time. No mode switches. No ventricular arrhythmias. Histogram distribution appropriate for patient and level of activity. No changes made this session. Device programmed at appropriate safety margins. Device programmed to optimize intrinsic conduction. Estimated longevity 10.4. Pt enrolled in remote follow-up. Patient education completed including shock plan.

## 2019-11-26 ENCOUNTER — Other Ambulatory Visit: Payer: Self-pay

## 2019-11-30 ENCOUNTER — Other Ambulatory Visit: Payer: Self-pay

## 2019-11-30 ENCOUNTER — Encounter: Payer: Self-pay | Admitting: Internal Medicine

## 2019-11-30 ENCOUNTER — Ambulatory Visit: Payer: Medicare HMO | Admitting: Internal Medicine

## 2019-11-30 VITALS — BP 138/84 | HR 65 | Temp 96.5°F | Ht 67.0 in | Wt 156.2 lb

## 2019-11-30 DIAGNOSIS — R0602 Shortness of breath: Secondary | ICD-10-CM | POA: Diagnosis not present

## 2019-11-30 DIAGNOSIS — R5383 Other fatigue: Secondary | ICD-10-CM | POA: Diagnosis not present

## 2019-11-30 DIAGNOSIS — D696 Thrombocytopenia, unspecified: Secondary | ICD-10-CM

## 2019-11-30 DIAGNOSIS — G959 Disease of spinal cord, unspecified: Secondary | ICD-10-CM

## 2019-11-30 DIAGNOSIS — M5126 Other intervertebral disc displacement, lumbar region: Secondary | ICD-10-CM

## 2019-11-30 DIAGNOSIS — I469 Cardiac arrest, cause unspecified: Secondary | ICD-10-CM

## 2019-11-30 DIAGNOSIS — E785 Hyperlipidemia, unspecified: Secondary | ICD-10-CM

## 2019-11-30 DIAGNOSIS — J4530 Mild persistent asthma, uncomplicated: Secondary | ICD-10-CM

## 2019-11-30 DIAGNOSIS — Z79899 Other long term (current) drug therapy: Secondary | ICD-10-CM | POA: Diagnosis not present

## 2019-11-30 DIAGNOSIS — I5030 Unspecified diastolic (congestive) heart failure: Secondary | ICD-10-CM

## 2019-11-30 DIAGNOSIS — F321 Major depressive disorder, single episode, moderate: Secondary | ICD-10-CM

## 2019-11-30 MED ORDER — SPIRIVA RESPIMAT 1.25 MCG/ACT IN AERS: 2.0000 | INHALATION_SPRAY | Freq: Every day | RESPIRATORY_TRACT | 2 refills | Status: AC

## 2019-11-30 NOTE — Assessment & Plan Note (Signed)
Did not tolerate Anoro Ellipta or Trilogy,  Symptoms improve with albuterol.  Trial of Spiriva respimat

## 2019-11-30 NOTE — Progress Notes (Signed)
Subjective:  Patient ID: Nicole Swanson, female    DOB: [birth year]  Age: 64 y.o. MRN: [id number]  CC: The primary encounter diagnosis was Thrombocytopenia ([place]). 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  HPI [name] presents for follow up on depression,  Fatigue, shortness of breath  This visit occurred during the SARS-CoV-2 public health emergency.  Safety protocols were in place, including screening questions prior to the visit, additional usage of staff PPE, and extensive cleaning of exam room while observing appropriate contact time as indicated for disinfecting solutions.   Patient has received both doses of the Elias-Fela Solis 19 vaccine without complications.  Patient continues to mask when outside of the home except when walking in yard or at safe distances from others .  Patient denies any change in mood or development of unhealthy behaviors resuting from the pandemic's restriction of activities and socialization.      Reviewed recent admission for pacemaker generator change.  R & L heart cath was done,  Normal.  Mild LVH.  Started on qod lasix   Still fatigued with activity  but depressive symptoms have greatly improved with regimen of  wellbutrin low dose, and pain better managed with cymbalta.      Shortness of breath:  Did not tolerate Anoro trial due to throat pain [name]),  Changed it to Trilogy,  Had same result after a while. Gargles after using medication with regular water. spiriva attempted to prescribe but unable for unclear reasons.   Sees [name] soon.  Thinks spriva was rejected by insurance    Cervical radiculopathy:  .  [name] since last visit .  Received  lumbar ESI injection by [name] in late January which helped significantly and her  cervical ESI's scheduled in April .  q 3 month ESIs planned and increase in cymbalta if needed,  Not needed currently    Outpatient Medications Prior to Visit  Medication Sig Dispense Refill  . buPROPion (WELLBUTRIN) 75 MG tablet Take 1 tablet (75 mg total) by mouth 2 (two) times daily. 180 tablet 1  . Cholecalciferol (VITAMIN D) 50 MCG (2000 UT) CAPS Take 2,000 Units by mouth at bedtime.     . Cranberry 360 MG CAPS Take 360 mg by mouth daily. Calcium 90 Thera Cran 1    . dicyclomine (BENTYL) 20 MG tablet Take 1 tablet (20 mg total) by mouth 3 (three) times daily as needed for spasms. 30 tablet 1  . docusate sodium (COLACE) 100 MG capsule Take 100 mg by mouth 2 (two) times daily.     . DULoxetine (CYMBALTA) 30 MG capsule Take 1 capsule (30 mg total) by mouth daily with supper. 90 capsule 1  . fluticasone (FLONASE) 50 MCG/ACT nasal spray Place 1 spray into both nostrils daily. 16 g 5  . furosemide (LASIX) 20 MG tablet Take 1 tablet (20 mg) by mouth 2-3 times per week as needed for increased shortness of breath/ swelling (Patient taking differently: Take 20 mg by mouth See admin instructions. Take 1 tablet (20 mg) by mouth 2-3 times per week as needed for increased shortness of breath/ swelling) 90 tablet 0  . gabapentin (NEURONTIN) 100 MG capsule Take 1 capsule (100 mg total) by mouth 3 (three) times daily. (Patient  taking differently: Take 100 mg by mouth at bedtime. ) 90 capsule 3  . loratadine (CLARITIN) 10 MG tablet Take 10 mg by mouth daily.    . montelukast (SINGULAIR) 10 MG tablet TAKE 1 TABLET AT BEDTIME (Patient taking differently: Take 10 mg by mouth at bedtime. ) 90 tablet 3  . Multiple Vitamins-Minerals (MULTIVITAMIN WITH MINERALS) tablet Take 1 tablet by mouth daily. Centrum Silver    . omeprazole (PRILOSEC) 40 MG capsule Take 1 capsule (40 mg total) by mouth 2 (two) times daily. (Patient taking  differently: Take 40 mg by mouth daily. ) 180 capsule 1  . ondansetron (ZOFRAN) 4 MG tablet Take 1 tablet (4 mg total) by mouth every 8 (eight) hours as needed for nausea or vomiting. 30 tablet 0  . senna (SENOKOT) 8.6 MG tablet Take 1 tablet by mouth daily as needed for constipation.     . sotalol (BETAPACE) 120 MG tablet TAKE 1 TABLET 2 TIMES DAILY (Patient taking differently: Take 120 mg by mouth 2 (two) times daily. ) 180 tablet 3  . VENTOLIN HFA 108 (90 Base) MCG/ACT inhaler Inhale 2 puffs into the lungs every 4 (four) hours as needed for wheezing or shortness of breath.      No facility-administered medications prior to visit.    Review of Systems;  Patient denies headache, fevers, malaise, unintentional weight loss, skin rash, eye pain, sinus congestion and sinus pain, sore throat, dysphagia,  hemoptysis , cough, dyspnea, wheezing, chest pain, palpitations, orthopnea, edema, abdominal pain, nausea, melena, diarrhea, constipation, flank pain, dysuria, hematuria, urinary  Frequency, nocturia, numbness, tingling, seizures,  Focal weakness, Loss of consciousness,  Tremor, insomnia, depression, anxiety, and suicidal ideation.      Objective:  BP 138/84   Pulse 65   Temp (!) 96.5 F (35.8 C) (Temporal)   Ht 5\' 7"  (1.702 m)   Wt 156 lb 3.2 oz (70.9 kg)   SpO2 99%   BMI 24.46 kg/m   BP Readings from Last 3 Encounters:  11/30/19 138/84  10/26/19 118/68  08/31/19 122/78    Wt Readings from Last 3 Encounters:  11/30/19 156 lb 3.2 oz (70.9 kg)  10/26/19 156 lb (70.8 kg)  08/31/19 156 lb (70.8 kg)    General appearance: alert, cooperative and appears stated age Ears: normal TM's and external ear canals both ears Throat: lips, mucosa, and tongue normal; teeth and gums normal Neck: no adenopathy, no carotid bruit, supple, symmetrical, trachea midline and thyroid not enlarged, symmetric, no tenderness/mass/nodules Back: symmetric, no curvature. ROM normal. No CVA tenderness. Lungs:  clear to auscultation bilaterally Heart: regular rate and rhythm, S1, S2 normal, no murmur, click, rub or gallop Abdomen: soft, non-tender; bowel sounds normal; no masses,  no organomegaly Pulses: 2+ and symmetric Skin: Skin color, texture, turgor normal. No rashes or lesions Lymph nodes: Cervical, supraclavicular, and axillary nodes normal.  No results found for: HGBA1C  Lab Results  Component Value Date   CREATININE 0.94 10/22/2019   CREATININE 0.85 02/24/2019   CREATININE 0.88 08/13/2018    Lab Results  Component Value Date   WBC 4.1 10/22/2019   HGB 11.9 (L) 10/26/2019   HCT 35.0 (L) 10/26/2019   PLT 174 10/22/2019   GLUCOSE 115 (H) 10/22/2019   CHOL 226 (H) 02/21/2018   TRIG 78.0 02/21/2018   HDL 85.70 02/21/2018   LDLCALC 125 (H) 02/21/2018   ALT 10 02/24/2019   AST 17 02/24/2019   NA 140 10/26/2019   K 3.6 10/26/2019  CL 102 10/22/2019   CREATININE 0.94 10/22/2019   BUN 18 10/22/2019   CO2 25 10/22/2019   TSH 1.980 02/24/2019   INR 0.99 06/19/2016    CUP PACEART REMOTE DEVICE CHECK  Result Date: 10/22/2019 Alert remote reviewed.  Normal device function.  The device has reached the elective replacement indicator on 10/22/2019.  Sent to triage Next remote to be determined. 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 Follow up with pulmonology . .  EF normal by Feb 2021 heart cath      Major depressive disorder, single episode    Improved with low dose welbutrin  And cymbalta,  No changes today ,       Thrombocytopenia ([place]) - Primary    Stable/resovled by Feb 2021 labs.   Rechecking today  Lab Results  Component Value Date   WBC 4.1 10/22/2019   HGB 11.9 (L) 10/26/2019   HCT 35.0  (L) 10/26/2019   MCV 96.2 10/22/2019   PLT 174 10/22/2019         Asthma    Did not tolerate Anoro Ellipta or Trilogy,  Symptoms improve with albuterol.  Trial of Spiriva respimat       Relevant Medications   Tiotropium Bromide Monohydrate (SPIRIVA RESPIMAT) 1.25 MCG/ACT AERS   High risk medication use   Relevant Orders   Comprehensive metabolic panel   Fatigue    ruling out thyroid and b12 deficiencies       Relevant Orders   TSH   Vitamin B12    Other Visit Diagnoses    Hyperlipidemia LDL goal <130       Relevant Orders   Lipid panel     I provided  30 minutes of  face-to-face time during this encounter reviewing patient's current problems and past surgeries, labs and imaging studies, providing counseling on the above mentioned problems , and coordination  of care .  I am having Toluwanimi A. Hussein start on Spiriva Respimat. I am also having her maintain her docusate sodium, Cranberry, senna, Vitamin D, dicyclomine, ondansetron, omeprazole, fluticasone, Ventolin HFA, loratadine, gabapentin, montelukast, DULoxetine, furosemide, sotalol, multivitamin with minerals, and buPROPion.  Meds ordered this encounter  Medications  . Tiotropium Bromide Monohydrate (SPIRIVA RESPIMAT) 1.25 MCG/ACT AERS    Sig: Inhale 2 puffs into the lungs daily.    Dispense:  4 g    Refill:  2    There are no discontinued medications.  Follow-up: Return in about 3 months (around 03/01/2020).   [name], MD

## 2019-11-30 NOTE — Patient Instructions (Addendum)
I sent Spiriva respimat (in a mist, not powder ) to [facility] as a substitute for the trilogy    No changes to cymbalta or wellbutrin today  See you in 3 months  ,  Come in for Fasting labs prior to visit if possible  Regarding your use of lasix:  I recommend taking an extra dose if you gain 2 lbs overnight or 5 lbs over a week

## 2019-12-01 DIAGNOSIS — R5383 Other fatigue: Secondary | ICD-10-CM | POA: Insufficient documentation

## 2019-12-01 DIAGNOSIS — G959 Disease of spinal cord, unspecified: Secondary | ICD-10-CM | POA: Insufficient documentation

## 2019-12-01 NOTE — Assessment & Plan Note (Signed)
Her pain is managed adequately with Cymbalta, and improved with ESI done in January

## 2019-12-01 NOTE — Assessment & Plan Note (Signed)
Improved with low dose welbutrin  And cymbalta,  No changes today ,

## 2019-12-01 NOTE — Assessment & Plan Note (Signed)
Stable/resovled by Feb 2021 labs.   Rechecking today  Lab Results  Component Value Date   WBC 4.1 10/22/2019   HGB 11.9 (L) 10/26/2019   HCT 35.0 (L) 10/26/2019   MCV 96.2 10/22/2019   PLT 174 10/22/2019

## 2019-12-01 NOTE — Assessment & Plan Note (Signed)
ruling out thyroid and b12 deficiencies

## 2019-12-01 NOTE — Assessment & Plan Note (Signed)
Adding Spiriva . Follow up with pulmonology . .  EF normal by Feb 2021 heart cath

## 2019-12-11 NOTE — Progress Notes (Signed)
No ICM remote transmission received for 12/07/2019 and next ICM transmission scheduled for 12/22/2019.

## 2019-12-22 ENCOUNTER — Other Ambulatory Visit: Payer: Self-pay

## 2019-12-22 MED ORDER — MONTELUKAST SODIUM 10 MG PO TABS: 10.0000 mg | ORAL_TABLET | Freq: Every day | ORAL | 3 refills | Status: AC

## 2019-12-22 MED ORDER — FLUTICASONE PROPIONATE 50 MCG/ACT NA SUSP: 1.0000 | Freq: Every day | NASAL | 3 refills | Status: AC

## 2019-12-30 ENCOUNTER — Ambulatory Visit (INDEPENDENT_AMBULATORY_CARE_PROVIDER_SITE_OTHER): Payer: Medicare HMO

## 2019-12-30 DIAGNOSIS — Z95810 Presence of automatic (implantable) cardiac defibrillator: Secondary | ICD-10-CM | POA: Diagnosis not present

## 2019-12-30 DIAGNOSIS — I5030 Unspecified diastolic (congestive) heart failure: Secondary | ICD-10-CM

## 2019-12-31 ENCOUNTER — Telehealth: Payer: Self-pay

## 2019-12-31 NOTE — Telephone Encounter (Signed)
Spoke with patient to remind of missed remote transmission 

## 2020-01-01 NOTE — Progress Notes (Signed)
EPIC Encounter for ICM Monitoring  Patient Name: Nicole Swanson is a 64 y.o. female Date: 01/01/2020 Primary Care Physican: [name], MD Primary Cardiologist:Klein Electrophysiologist:Klein 4/30/2021Weight: 153-155.5lbs   Heart faiilure questions reviewed.  She said she is feeling well.     Optivol thoracic impedancenormal.    Prescribed: Furosemide20 mgTake 1 tablet (20 mg) by mouth 2-3 times per week as needed for increased shortness of breath/swelling. She takes Furosemide 1-2 times a week.  Labs: 02/24/2019 Creatinine0.85, BUN16, Potassium4.5, Sodium141, [id number] A complete set of results can be found in Results Review.  Recommendations: No changes and encouraged to call if experiencing any fluid symptoms.     Follow-up plan: ICM clinic phone appointment on 02/02/2020. 91 day device clinic remote transmission scheduled 01/25/2020.  Copy of ICM check sent to [place]  3 month ICM trend: 12/31/2019    1 Year ICM trend:       [name], RN 01/01/2020 4:36 PM

## 2020-01-18 ENCOUNTER — Ambulatory Visit (INDEPENDENT_AMBULATORY_CARE_PROVIDER_SITE_OTHER): Payer: Medicare HMO

## 2020-01-18 VITALS — Ht 67.0 in | Wt 156.0 lb

## 2020-01-18 DIAGNOSIS — Z0000 Encounter for general adult medical examination without abnormal findings: Secondary | ICD-10-CM | POA: Diagnosis not present

## 2020-01-18 DIAGNOSIS — Z1159 Encounter for screening for other viral diseases: Secondary | ICD-10-CM | POA: Diagnosis not present

## 2020-01-18 NOTE — Patient Instructions (Addendum)
  Nicole Swanson , Thank you for taking time to come for your Medicare Wellness Visit. I appreciate your ongoing commitment to your health goals. Please review the following plan we discussed and let me know if I can assist you in the future.   These are the goals we discussed: Goals    . Follow up with Primary Care Provider     As needed       This is a list of the screening recommended for you and due dates:  Health Maintenance  Topic Date Due  .  Hepatitis C: One time screening is recommended by Center for Disease Control  (CDC) for  adults born from [birth year] through 1967.   Never done  . Pneumonia vaccines (1 of 2 - PCV13) Never done  . DEXA scan (bone density measurement)  01/17/2021*  . Flu Shot  04/03/2020  . Mammogram  07/08/2021  . Tetanus Vaccine  11/17/2023  . Colon Cancer Screening  01/19/2027  . COVID-19 Vaccine  Completed  *Topic was postponed. The date shown is not the original due date.

## 2020-01-18 NOTE — Progress Notes (Addendum)
Subjective:   Nicole Swanson is a 64 y.o. female who presents for an Initial Medicare Annual Wellness Visit.  Review of Systems    No ROS.  Medicare Wellness Virtual Visit.  Visual/audio telehealth visit, UTA vital signs.   See social history for additional risk factors.    Cardiac Risk Factors include: advanced age (>69men, >81 women)     Objective:    Today's Vitals   01/18/20 1407  Weight: 156 lb (70.8 kg)  Height: 5\' 7"  (1.702 m)   Body mass index is 24.43 kg/m.  Advanced Directives 01/18/2020 10/26/2019 03/04/2019 01/18/2017 10/30/2016 06/19/2016 06/19/2016  Does Patient Have a Medical Advance Directive? Yes Yes Yes Yes Yes Yes Yes  Type of Paramedic of [place];Living will [place];Living will [place];Living will [place];Living will [place];Living will [place];Living will [place];Living will  Does patient want to make changes to medical advance directive? No - Patient declined - Yes (Inpatient - patient requests chaplain consult to change a medical advance directive) - - No - Patient declined -  Copy of [place] in Chart? Yes - validated most recent copy scanned in chart (See row information) - - Yes - Yes Yes  Would patient like information on creating a medical advance directive? - - - - No - Patient declined - -    Current Medications (verified) Outpatient Encounter Medications as of 01/18/2020  Medication Sig  . buPROPion (WELLBUTRIN) 75 MG tablet Take 1 tablet (75 mg total) by mouth 2 (two) times daily.  . Cholecalciferol (VITAMIN D) 50 MCG (2000 UT) CAPS Take 2,000 Units by mouth at bedtime.   . Cranberry 360 MG CAPS Take 360 mg by mouth daily. Calcium 90 Thera Cran 1  . dicyclomine (BENTYL) 20 MG tablet Take 1 tablet (20 mg total) by mouth 3 (three) times daily as needed for spasms.  Marland Kitchen docusate sodium  (COLACE) 100 MG capsule Take 100 mg by mouth 2 (two) times daily.   . DULoxetine (CYMBALTA) 30 MG capsule Take 1 capsule (30 mg total) by mouth daily with supper.  . fluticasone (FLONASE) 50 MCG/ACT nasal spray Place 1 spray into both nostrils daily.  . furosemide (LASIX) 20 MG tablet Take 1 tablet (20 mg) by mouth 2-3 times per week as needed for increased shortness of breath/ swelling (Patient taking differently: Take 20 mg by mouth See admin instructions. Take 1 tablet (20 mg) by mouth 2-3 times per week as needed for increased shortness of breath/ swelling)  . gabapentin (NEURONTIN) 100 MG capsule Take 1 capsule (100 mg total) by mouth 3 (three) times daily. (Patient taking differently: Take 100 mg by mouth at bedtime. )  . loratadine (CLARITIN) 10 MG tablet Take 10 mg by mouth daily.  . montelukast (SINGULAIR) 10 MG tablet Take 1 tablet (10 mg total) by mouth at bedtime.  . Multiple Vitamins-Minerals (MULTIVITAMIN WITH MINERALS) tablet Take 1 tablet by mouth daily. Centrum Silver  . omeprazole (PRILOSEC) 40 MG capsule Take 1 capsule (40 mg total) by mouth 2 (two) times daily. (Patient taking differently: Take 40 mg by mouth daily. )  . ondansetron (ZOFRAN) 4 MG tablet Take 1 tablet (4 mg total) by mouth every 8 (eight) hours as needed for nausea or vomiting.  . senna (SENOKOT) 8.6 MG tablet Take 1 tablet by mouth daily as needed for constipation.   . sotalol (BETAPACE) 120 MG  tablet TAKE 1 TABLET 2 TIMES DAILY (Patient taking differently: Take 120 mg by mouth 2 (two) times daily. )  . Tiotropium Bromide Monohydrate (SPIRIVA RESPIMAT) 1.25 MCG/ACT AERS Inhale 2 puffs into the lungs daily.  . VENTOLIN HFA 108 (90 Base) MCG/ACT inhaler Inhale 2 puffs into the lungs every 4 (four) hours as needed for wheezing or shortness of breath.    No facility-administered encounter medications on file as of 01/18/2020.    Allergies (verified) Vancomycin, Prednisone, Penicillins, and Sulfa antibiotics    History: Past Medical History:  Diagnosis Date  . Aborted cardiac arrest 1994  . AICD (automatic cardioverter/defibrillator) present   . Allergy   . Anginal pain ([place])   . Arthritis   . Asthma    hx of asthma related to air bag release   . Dual implantable cardiac defibrillator    Medtronic initially implanted in 1997  . DVT (deep vein thrombosis) in pregnancy 1986   had heparin shots followed by coumadin  . Fall    10/11/17 walking dogs   . Family history of seizures    Question neurological versus cardiac  . Heart murmur   . Irritable bowel syndrome   . 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 PONV (postoperative nausea and vomiting)   . Presence of permanent cardiac pacemaker   . PVC's (premature ventricular contractions)   . Raynaud's syndrome   . Shortness of breath dyspnea    with exertion   . Ventricular tachycardia [facility])    Past Surgical History:  Procedure Laterality Date  . ABDOMINAL HYSTERECTOMY    . APPENDECTOMY    . CARDIAC DEFIBRILLATOR PLACEMENT  1994   Dual chamber with pacemaker  . CARPAL TUNNEL RELEASE Right   . COLONOSCOPY WITH PROPOFOL N/A 01/18/2017   Procedure: COLONOSCOPY WITH PROPOFOL;  Surgeon: Manya Silvas, MD;  Location: [facility] ENDOSCOPY;  Service: Endoscopy;  Laterality: N/A;  . CYSTOCELE REPAIR N/A 06/19/2016   Procedure: ANTERIOR REPAIR (CYSTOCELE) WITH VAULT PROLAPSE;  Surgeon: Bjorn Loser, MD;  Location: [facility] ORS;  Service: Urology;  Laterality: N/A;  . CYSTOSCOPY N/A 06/19/2016   Procedure: CYSTOSCOPY;  Surgeon: Bjorn Loser, MD;  Location: [facility] ORS;  Service: Urology;  Laterality: N/A;  . ESOPHAGOGASTRODUODENOSCOPY (EGD) WITH PROPOFOL N/A 01/18/2017   Procedure: ESOPHAGOGASTRODUODENOSCOPY (EGD) WITH PROPOFOL;  Surgeon: Manya Silvas, MD;  [facility] ENDOSCOPY;  Service: Endoscopy;  Laterality: N/A;  . ICD GENERATOR CHANGEOUT N/A 10/26/2019   Procedure: ICD GENERATOR CHANGEOUT;  Surgeon: Deboraha Sprang, MD;   Location: [place] CV LAB;  Service: Cardiovascular;  Laterality: N/A;  . LAPAROSCOPIC LYSIS OF ADHESIONS    . NASAL SEPTUM SURGERY  [address]   . OOPHORECTOMY    . PACEMAKER INSERTION     5 different devices  . RIGHT/LEFT HEART CATH AND CORONARY ANGIOGRAPHY N/A 10/26/2019   Procedure: RIGHT/LEFT HEART CATH AND CORONARY ANGIOGRAPHY;  Surgeon: Jolaine Artist, MD;  Location: [place] [facility];  Service: Cardiovascular;  Laterality: N/A;   Family History  Problem Relation Age of Onset  . Heart disease Mother 33       A Fib, CHF  . Stroke Mother   . Heart disease Father   . Stroke Father   . Seizures Brother   . Seizures Daughter   . Breast cancer Neg Hx    Social History   Socioeconomic History  . Marital status: Widowed    Spouse name: Not on file  .  Number of children: Not on file  . Years of education: Not on file  . Highest education level: Not on file  Occupational History  . Not on file  Tobacco Use  . Smoking status: Former Smoker    Types: Cigarettes    Quit date: 06/04/1995    Years since quitting: 24.6  . Smokeless tobacco: Never Used  Substance and Sexual Activity  . Alcohol use: Yes    Alcohol/week: 0.0 standard drinks    Comment: occasional  . Drug use: No  . Sexual activity: Not on file  Other Topics Concern  . Not on file  Social History Narrative   Works [place] Strain:   . Difficulty of Paying Living Expenses:   Food Insecurity:   . Worried About Charity fundraiser in the Last Year:   . Arboriculturist in the Last Year:   Transportation Needs:   . Film/video editor (Medical):   Marland Kitchen Lack of Transportation (Non-Medical):   Physical Activity:   . Days of Exercise per Week:   . Minutes of Exercise per Session:   Stress:   . Feeling of Stress :   Social Connections:   . Frequency of Communication with Friends and Family:   . Frequency of Social  Gatherings with Friends and Family:   . Attends Religious Services:   . Active Member of Clubs or Organizations:   . Attends Archivist Meetings:   Marland Kitchen Marital Status:     Tobacco Counseling Counseling given: Not Answered   Clinical Intake:  Pre-visit preparation completed: Yes        Diabetes: No  How often do you need to have someone help you when you read instructions, pamphlets, or other written materials from your doctor or pharmacy?: 1 - Never  Interpreter Needed?: No      Activities of Daily Living In your present state of health, do you have any difficulty performing the following activities: 01/18/2020  Hearing? Y  Comment Hearing aid L ear  Vision? N  Difficulty concentrating or making decisions? N  Walking or climbing stairs? N  Dressing or bathing? N  Doing errands, shopping? N  Preparing Food and eating ? N  Using the Toilet? N  In the past six months, have you accidently leaked urine? N  Do you have problems with loss of bowel control? N  Managing your Medications? N  Managing your Finances? N  Housekeeping or managing your Housekeeping? N  Some recent data might be hidden     Immunizations and Health Maintenance Immunization History  Administered Date(s) Administered  . Fluad Quad(high Dose 65+) 05/08/2019  . Influenza Split 05/11/2013  . Influenza,inj,Quad PF,6+ Mos 06/06/2015  . Influenza-Unspecified 06/01/2014, 05/12/2018  . PFIZER SARS-COV-2 Vaccination 10/11/2019, 11/05/2019  . Tdap 11/16/2013  . Zoster 11/16/2013   Health Maintenance Due  Topic Date Due  . Hepatitis C Screening  Never done  . PNA vac Low Risk Adult (1 of 2 - PCV13) Never done    Patient Care Team: [name], MD as PCP - General (Internal Medicine)  Indicate any recent Medical Services you may have received from other than [facility] providers in the past year (date may be approximate).     Assessment:   This is a routine wellness examination for  [place].  Nurse connected with patient 01/18/20 at  2:00 PM EDT by  a telephone enabled telemedicine application, patient has no access to video or difficulty using video and verified that I am speaking with the correct person using two identifiers. Patient stated full name and DOB. Patient gave permission to continue with telephone visit. Patient's location was at home and Nurse's location was at [place] office.   Patient is alert and oriented x3. Patient denies difficulty focusing or concentrating. Patient works part time.   Health Maintenance Due: -PNA vaccine- discussed; patient plans to update office with record.  -Hepatitis C Screening- ordered. -Dexa Scan- deferred per patient request.  See completed HM at the end of note.   Eye: Visual acuity not assessed. Virtual visit. Followed by their ophthalmologist.  Dental: Visits every 6 months.    Hearing: Hearing aid L ear  Safety:  Patient feels safe at home- yes Patient does have smoke detectors at home- yes Patient does wear sunscreen or protective clothing when in direct sunlight - yes Patient does wear seat belt when in a moving vehicle - yes Patient drives- yes Adequate lighting in walkways free from debris- yes Grab bars and handrails used as appropriate- yes Ambulates with an assistive device- no Cell phone on person when ambulating outside of the home-yes  Medication: Taking as directed and without issues.  Pill box in use -yes  Self managed - yes   Covid-19: Precautions and sickness symptoms discussed. Wears mask, social distancing, hand hygiene as appropriate.   Activities of Daily Living Patient denies needing assistance with: household chores, feeding themselves, getting from bed to chair, getting to the toilet, bathing/showering, dressing, managing money, or preparing meals.   Discussed the importance of a healthy diet, water intake and the benefits of aerobic exercise.   Physical activity- walking 20-30  minutes  Diet:  Low sodium diet Fluid restriction  Caffeine: 1-2 cups of coffee  Other Providers Patient Care Team: [name], MD as PCP - General (Internal Medicine)  Hearing/Vision screen  Hearing Screening   125Hz  250Hz  500Hz  1000Hz  2000Hz  3000Hz  4000Hz  6000Hz  8000Hz   Right ear:           Left ear:           Comments: Hearing aid L ear   Vision Screening Comments: Wears corrective lenses Visual acuity not assessed, virtual visit.  They have seen their ophthalmologist.     Dietary issues and exercise activities discussed: Current Exercise Habits: Home exercise routine, Type of exercise: walking  Goals    . Follow up with Primary Care Provider     As needed      Depression Screen PHQ 2/9 Scores 01/18/2020 11/30/2019 10/14/2017 07/23/2016 09/08/2015 03/28/2015 03/08/2015  PHQ - 2 Score 0 0 0 0 0 0 0  Exception Documentation - - - - - (No Data) (No Data)    Fall Risk Fall Risk  01/18/2020 11/30/2019 08/31/2019 08/14/2019 05/19/2019  Falls in the past year? 0 0 0 0 0  Comment - - - - -  Number falls in past yr: 0 0 - - -  Injury with Fall? - 0 - - -  Risk for fall due to : - - - - -  Risk for fall due to: Comment - - - - -  Follow up Falls evaluation completed Falls evaluation completed Falls evaluation completed Falls evaluation completed Falls evaluation completed  Comment - - - - -    Timed Get Up and Go Performed no, virtual visit  Cognitive Function: MMSE - Mini Mental State Exam 01/18/2020  Not completed: Unable to complete        Screening Tests Health Maintenance  Topic Date Due  . Hepatitis C Screening  Never done  . PNA vac Low Risk Adult (1 of 2 - PCV13) Never done  . DEXA SCAN  01/17/2021 (Originally 12/21/2018)  . INFLUENZA VACCINE  04/03/2020  . MAMMOGRAM  07/08/2021  . TETANUS/TDAP  11/17/2023  . COLONOSCOPY  01/19/2027  . COVID-19 Vaccine  Completed      Plan:   Keep all routine maintenance appointments.   Medicare Attestation I  have personally reviewed: The patient's medical and social history Their use of alcohol, tobacco or illicit drugs Their current medications and supplements The patient's functional ability including ADLs,fall risks, home safety risks, cognitive, and hearing and visual impairment Diet and physical activities Evidence for depression   I have reviewed and discussed with patient certain preventive protocols, quality metrics, and best practice recommendations.      OBrien-Blaney, Marigny Borre L, LPN   [id number]      I have reviewed the above information and agree with above.   Deborra Medina, MD

## 2020-01-25 ENCOUNTER — Ambulatory Visit (INDEPENDENT_AMBULATORY_CARE_PROVIDER_SITE_OTHER): Payer: Medicare HMO | Admitting: *Deleted

## 2020-01-25 DIAGNOSIS — I469 Cardiac arrest, cause unspecified: Secondary | ICD-10-CM

## 2020-01-25 DIAGNOSIS — I493 Ventricular premature depolarization: Secondary | ICD-10-CM

## 2020-01-26 ENCOUNTER — Telehealth: Payer: Self-pay

## 2020-01-26 LAB — CUP PACEART REMOTE DEVICE CHECK
Battery Remaining Longevity: 122 mo
Battery Voltage: 3.11 V
Brady Statistic AP VP Percent: 0.03 %
Brady Statistic AP VS Percent: 73.77 %
Brady Statistic AS VP Percent: 0.02 %
Brady Statistic AS VS Percent: 26.18 %
Brady Statistic RA Percent Paced: 73.51 %
Brady Statistic RV Percent Paced: 0.05 %
Date Time Interrogation Session: 20210525111647
HighPow Impedance: 56 Ohm
HighPow Impedance: 82 Ohm
Implantable Lead Implant Date: 19970917
Implantable Lead Implant Date: 20070119
Implantable Lead Location: 753859
Implantable Lead Location: 753860
Implantable Lead Model: 5076
Implantable Lead Model: 6942
Implantable Pulse Generator Implant Date: 20210222
Lead Channel Impedance Value: 475 Ohm
Lead Channel Impedance Value: 513 Ohm
Lead Channel Impedance Value: 532 Ohm
Lead Channel Pacing Threshold Amplitude: 0.625 V
Lead Channel Pacing Threshold Amplitude: 0.75 V
Lead Channel Pacing Threshold Pulse Width: 0.4 ms
Lead Channel Pacing Threshold Pulse Width: 0.4 ms
Lead Channel Sensing Intrinsic Amplitude: 1.75 mV
Lead Channel Sensing Intrinsic Amplitude: 1.75 mV
Lead Channel Sensing Intrinsic Amplitude: 11.875 mV
Lead Channel Sensing Intrinsic Amplitude: 11.875 mV
Lead Channel Setting Pacing Amplitude: 1.5 V
Lead Channel Setting Pacing Amplitude: 2.5 V
Lead Channel Setting Pacing Pulse Width: 0.4 ms
Lead Channel Setting Sensing Sensitivity: 0.45 mV

## 2020-01-26 NOTE — Telephone Encounter (Signed)
Spoke with patient to remind of missed remote transmission 

## 2020-01-27 NOTE — Progress Notes (Signed)
Remote ICD transmission.   

## 2020-02-02 ENCOUNTER — Ambulatory Visit (INDEPENDENT_AMBULATORY_CARE_PROVIDER_SITE_OTHER): Payer: Medicare HMO

## 2020-02-02 DIAGNOSIS — I5030 Unspecified diastolic (congestive) heart failure: Secondary | ICD-10-CM | POA: Diagnosis not present

## 2020-02-02 DIAGNOSIS — Z95810 Presence of automatic (implantable) cardiac defibrillator: Secondary | ICD-10-CM

## 2020-02-11 ENCOUNTER — Other Ambulatory Visit: Payer: Self-pay

## 2020-02-11 MED ORDER — OMEPRAZOLE 40 MG PO CPDR: 40.0000 mg | DELAYED_RELEASE_CAPSULE | Freq: Two times a day (BID) | ORAL | 1 refills | Status: AC

## 2020-02-24 ENCOUNTER — Other Ambulatory Visit: Payer: Medicare HMO

## 2020-03-01 ENCOUNTER — Encounter: Payer: Medicare HMO | Admitting: Internal Medicine

## 2020-03-03 NOTE — Progress Notes (Signed)
EPIC Encounter for ICM Monitoring  Patient Name: Nicole Swanson is a 64 y.o. female Date: 02/29/20 Primary Care Physican: [name], MD Primary Cardiologist:Klein Electrophysiologist:Klein 4/30/2021Weight: 153-155.5lbs 02/29/2020 Weight: 150 lbs  Heart faiilure questions reviewed. She said she is feeling well but does have some stress in her life at this time.Marland Kitchen     Optivol thoracic impedancenormal.   Prescribed: Furosemide20 mgTake 1 tablet (20 mg) by mouth 2-3 times per week as needed for increased shortness of breath/swelling. She takes Furosemide 1-2 times a week.  Labs: 02/24/2019 Creatinine0.85, BUN16, Potassium4.5, Sodium141, [id number] A complete set of results can be found in Results Review.  Recommendations: No changes and encouraged to call if experiencing any fluid symptoms.   Follow-up plan: ICM clinic phone appointment on 03/08/2020. 91 day device clinic remote transmission scheduled 04/25/2020.  Copy of ICM check sent to [place]  3 month ICM trend: 02/02/2020    1 Year ICM trend:       [name], RN 29-Feb-2020 4:52 PM

## 2020-03-03 DEATH — deceased

## 2020-03-21 NOTE — Progress Notes (Signed)
No ICM remote transmission received for 03/08/2020 and next ICM transmission scheduled for 04/11/2020.

## 2021-01-18 ENCOUNTER — Ambulatory Visit: Payer: Medicare HMO

## 2021-09-24 IMAGING — CT CT CERVICAL SPINE W/O CM
3 series · 15 of 33 positions shown, 18 images · non-contrast
Comparison: None.

CLINICAL DATA: Neck pain radiating to the left upper extremity

EXAM:
CT CERVICAL SPINE WITHOUT CONTRAST
TECHNIQUE: Multidetector CT imaging of the cervical spine was performed without
intravenous contrast. Multiplanar CT image reconstructions were also
generated.

[Series 3: c spine soft · axial · 0.32mm/px · z∈[-263,-115]mm · 7 of 88 slices shown, 9 images]
[im 7/88  soft-tissue]
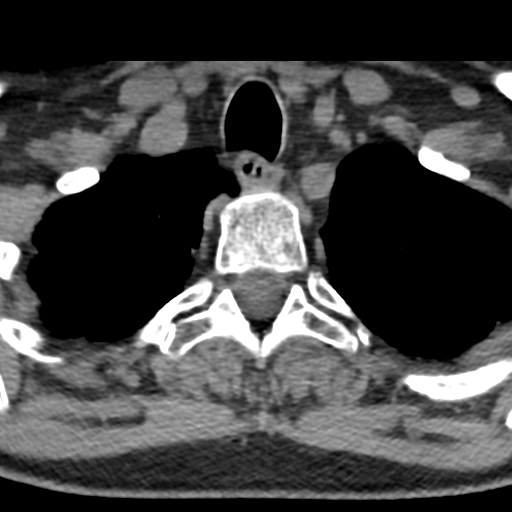
[im 7/88  bone]
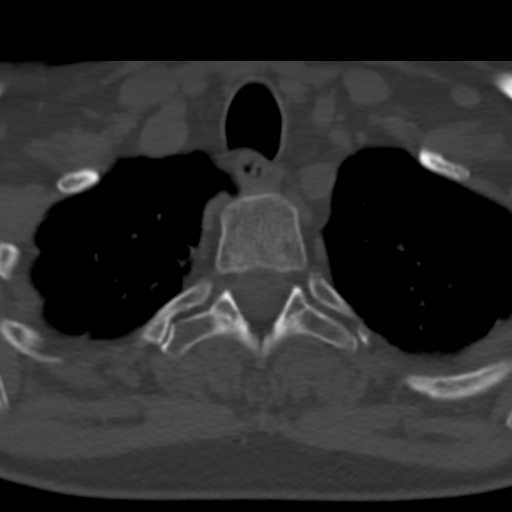
[im 21/88  bone]
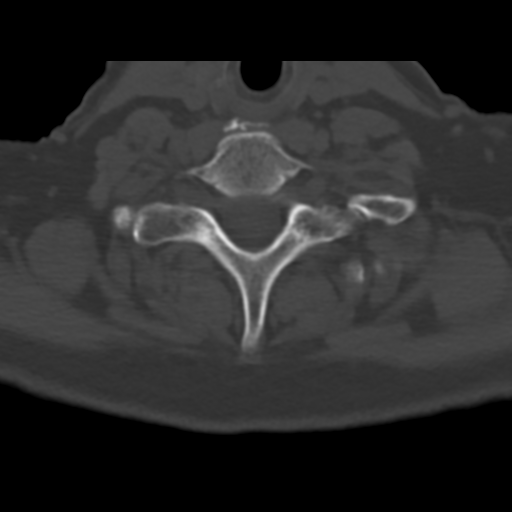
[im 34/88  bone]
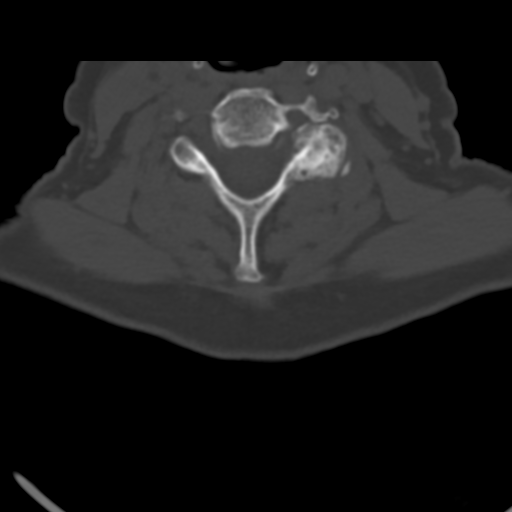
[im 47/88  bone]
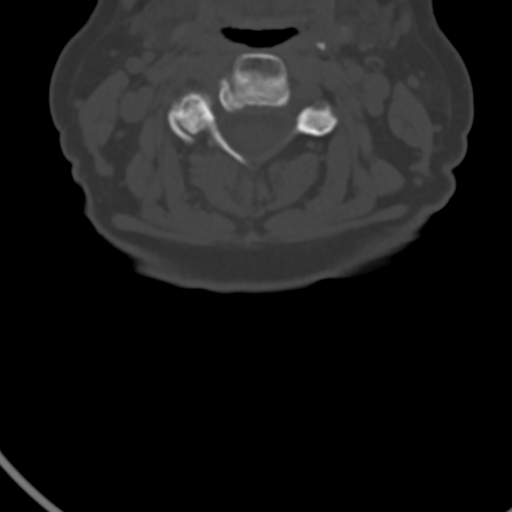
[im 54/88  soft-tissue]
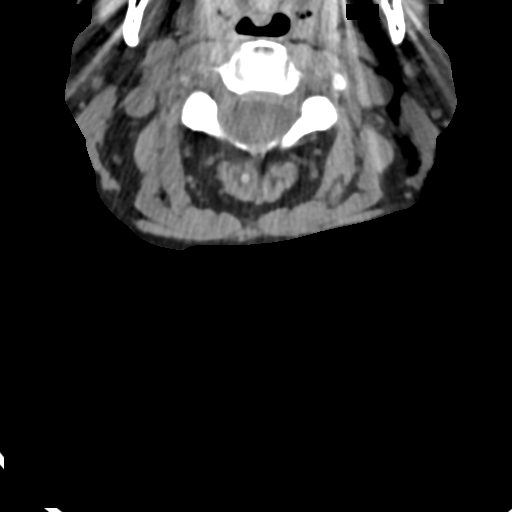
[im 54/88  bone]
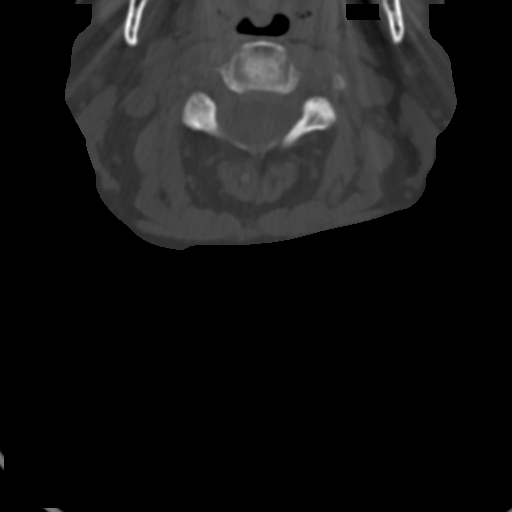
[im 67/88  bone]
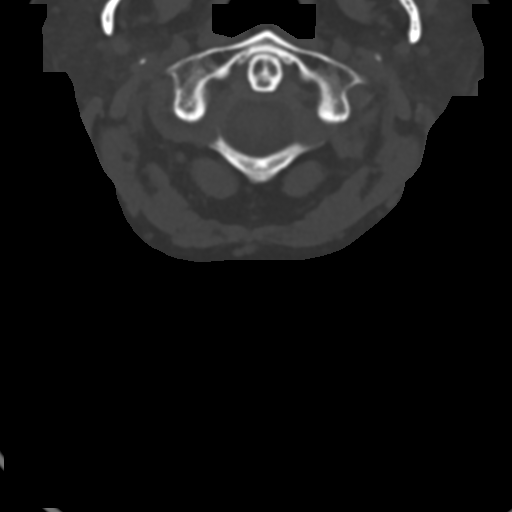
[im 81/88  bone]
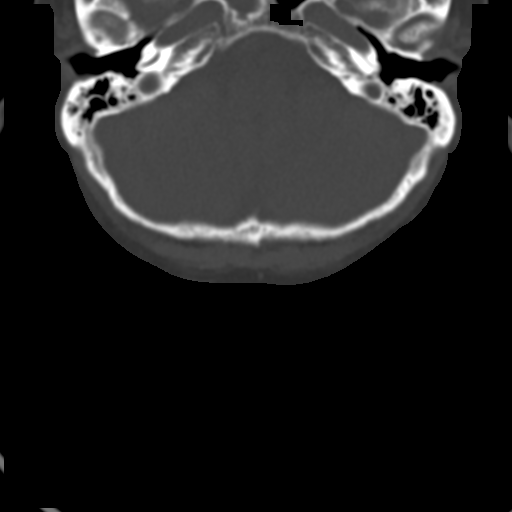

[Series 4: sagittal bone · sagittal · 0.36mm/px · 5 of 89 slices shown, 6 images]
[im 30/89  bone]
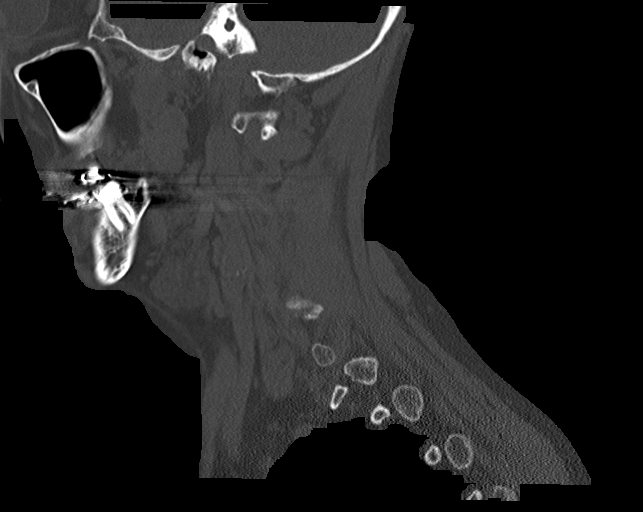
[im 37/89  bone]
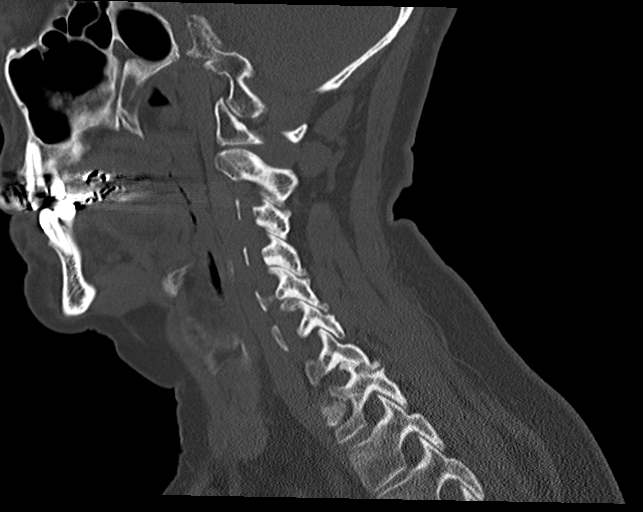
[im 45/89  soft-tissue]
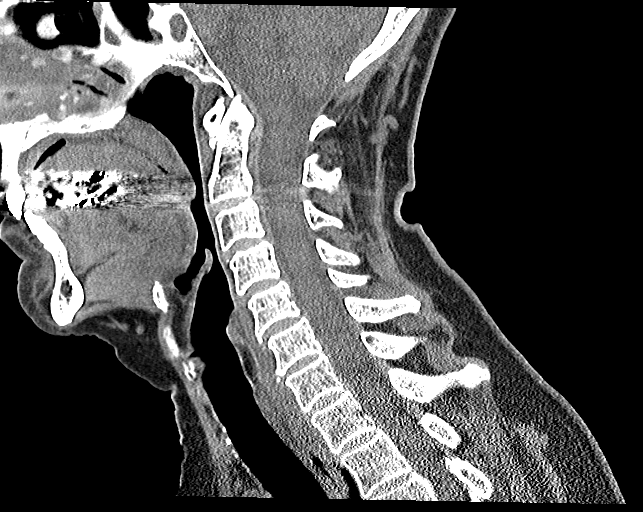
[im 45/89  bone]
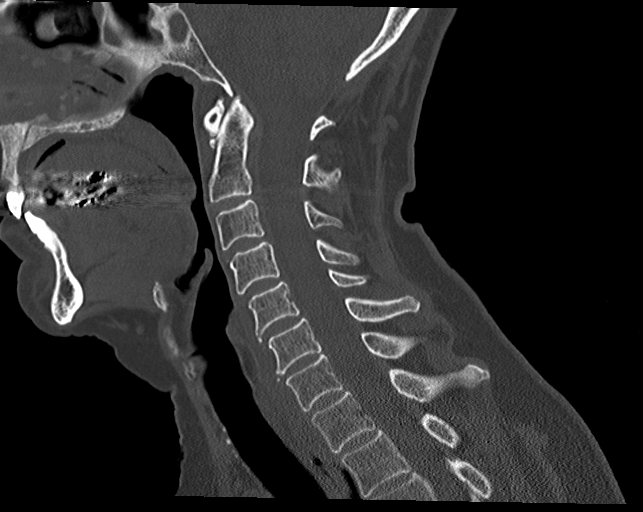
[im 52/89  bone]
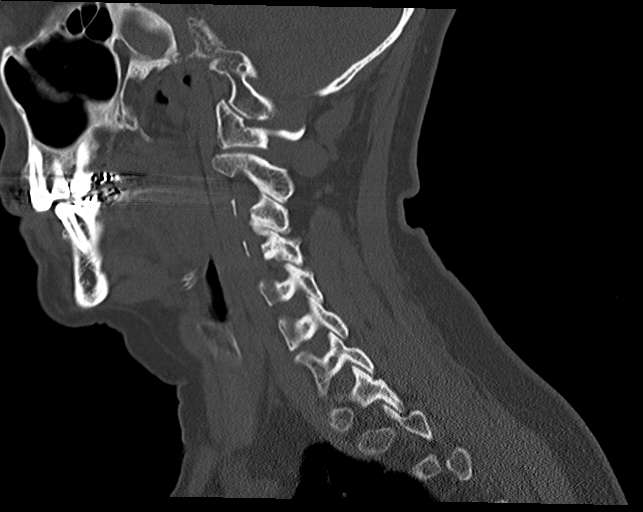
[im 59/89  bone]
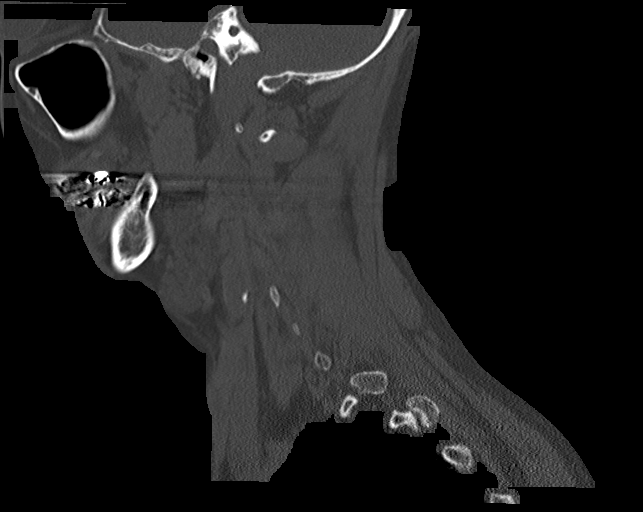

[Series 5: coronal bone · coronal · 0.34mm/px · 3 of 127 slices shown]
[im 26/127  bone]
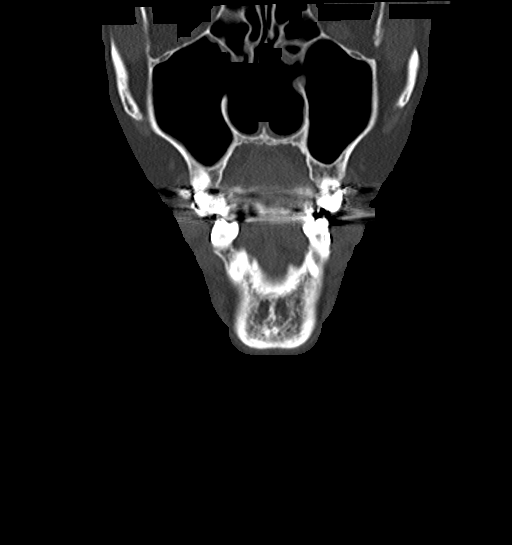
[im 51/127  bone]
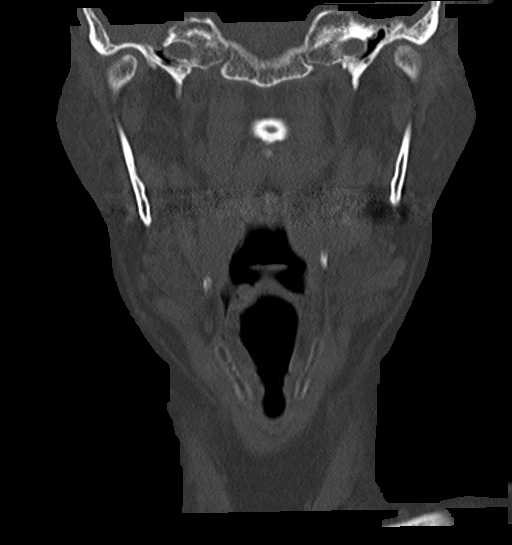
[im 76/127  bone]
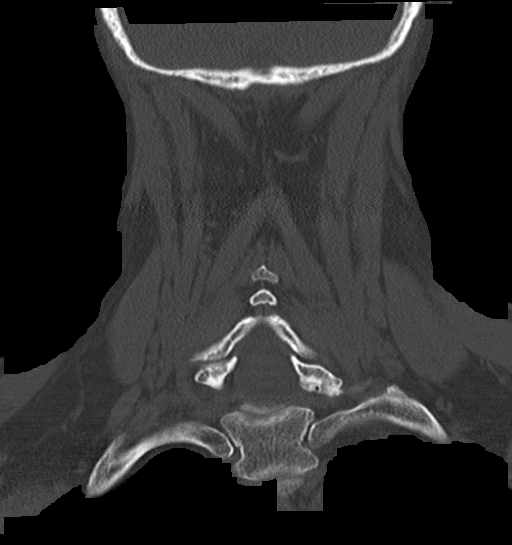

[15 of 33 positions shown; findings below may reference images not displayed]

FINDINGS: Alignment: No static subluxation. Facets are aligned. Occipital
condyles and the lateral masses of C1 and C2 are normally
approximated.

Skull base and vertebrae: No acute fracture.

Soft tissues and spinal canal: No prevertebral fluid or swelling. No
visible canal hematoma.

Disc levels: There is no spinal canal stenosis. There is no visible
disc herniation. There is facet arthrosis of the upper cervical
spine, worst on the right at C3-4 and C4-5 and worst on the left at
C4-5 and C5-6. Foraminal stenosis is greatest on the left at C5-6.

Upper chest: Biapical emphysema.

Other: Normal visualized paraspinal cervical soft tissues.
IMPRESSION: 1. No acute fracture or static subluxation of the cervical spine.
2. Multilevel facet arthrosis with foraminal narrowing greatest at
left C5-6.
3. Emphysema ([id number]).

## 2022-08-15 NOTE — Telephone Encounter (Signed)
Error
